# Patient Record
Sex: Female | Born: 1945
Health system: Southern US, Community
[De-identification: ages and names within clinical notes are randomized; demographics above are authoritative.]

## PROBLEM LIST (undated history)

## (undated) DIAGNOSIS — E785 Hyperlipidemia, unspecified: Secondary | ICD-10-CM

## (undated) DIAGNOSIS — E119 Type 2 diabetes mellitus without complications: Secondary | ICD-10-CM

## (undated) DIAGNOSIS — C801 Malignant (primary) neoplasm, unspecified: Secondary | ICD-10-CM

## (undated) DIAGNOSIS — N6320 Unspecified lump in the left breast, unspecified quadrant: Secondary | ICD-10-CM

## (undated) DIAGNOSIS — M1990 Unspecified osteoarthritis, unspecified site: Secondary | ICD-10-CM

## (undated) DIAGNOSIS — I10 Essential (primary) hypertension: Secondary | ICD-10-CM

## (undated) HISTORY — PX: APPENDECTOMY: SHX54

## (undated) HISTORY — PX: ABDOMINAL HYSTERECTOMY: SHX81

## (undated) HISTORY — PX: TONSILLECTOMY: SUR1361

---

## 2007-04-16 ENCOUNTER — Ambulatory Visit (HOSPITAL_COMMUNITY): Admission: RE | Admit: 2007-04-16 | Discharge: 2007-04-16 | Payer: Self-pay | Admitting: Obstetrics & Gynecology

## 2007-04-16 ENCOUNTER — Encounter (INDEPENDENT_AMBULATORY_CARE_PROVIDER_SITE_OTHER): Payer: Self-pay | Admitting: Specialist

## 2007-04-24 ENCOUNTER — Ambulatory Visit (HOSPITAL_COMMUNITY): Admission: RE | Admit: 2007-04-24 | Discharge: 2007-04-24 | Payer: Self-pay | Admitting: Obstetrics & Gynecology

## 2007-05-27 ENCOUNTER — Ambulatory Visit: Admission: RE | Admit: 2007-05-27 | Discharge: 2007-05-27 | Payer: Self-pay | Admitting: Gynecology

## 2008-03-05 ENCOUNTER — Other Ambulatory Visit: Admission: RE | Admit: 2008-03-05 | Discharge: 2008-03-05 | Payer: Self-pay | Admitting: Obstetrics and Gynecology

## 2008-04-28 ENCOUNTER — Ambulatory Visit (HOSPITAL_COMMUNITY): Admission: RE | Admit: 2008-04-28 | Discharge: 2008-04-28 | Payer: Self-pay | Admitting: Family Medicine

## 2008-07-29 ENCOUNTER — Other Ambulatory Visit: Admission: RE | Admit: 2008-07-29 | Discharge: 2008-07-29 | Payer: Self-pay | Admitting: Obstetrics and Gynecology

## 2011-05-01 NOTE — H&P (Signed)
Brittney Carroll, Brittney Carroll                 ACCOUNT NO.:  [id number]   MEDICAL RECORD NO.:  [id number]          PATIENT TYPE:  AMB   LOCATION:  DAY                           FACILITY:  [facility]   PHYSICIAN:  Florian Buff, M.D.   DATE OF BIRTH:  09/24/1946   DATE OF ADMISSION:  DATE OF DISCHARGE:  Brittney Carroll is a 65 year old white female, gravida 3, para 3, status post  tubal ligation who I saw in the office last week who has been having a  significant amount of postmenopausal bleeding now for 2 months.  The  patient has not had a period previously since 1996.  She has not been on  any hormone therapy at all.  Again, she has been bleeding almost daily  for 2 months.  Ultrasound performed ino the office by me revealed a  complex endometrium consistent with a polyp versus heterogenous myoma or  potentially endometrial hyperplasia or malignancy.  Her cervix was  normal.  Pap smear was done and is pending.  Because of the widened  endometrial stripe and the complexity of it, she needs to have a formal  D&C and not just an endometrial biopsy.  As a result, she is admitted  for outpatient hysteroscopy D&C.   PAST MEDICAL HISTORY:  Significant for hypertension.   PAST SURGICAL HISTORY:  1. Tonsils.  2. Tubal ligation.  3. Appendectomy.   PAST OBSTETRICAL HISTORY:  Three vaginal deliveries.   ALLERGIES:  ATENOLOL   MEDICATIONS:  Maxzide 37.5/25.   REVIEW OF SYSTEMS:  Otherwise negative.   PHYSICAL EXAMINATION:  VITAL SIGNS:  Blood pressure in the office  150/90.  Weight 204 pounds.  Hemoglobin 13.3.  HEENT:  Unremarkable.  NECK:  Thyroid normal.  LUNGS:  Clear.  HEART:  Regular rate and rhythm without murmur, regurgitation, or  gallop.  BREASTS:  Deferred.  ABDOMEN:  Benign.  PELVIC:  Exam reveals normal-size uterus on bimanual.  Normal external  genitalia.  Vagina pink and moist without discharge.  Cervix parous  without lesion.  Adnexa  negative.  EXTREMITIES:  Warm with no edema.  NEUROLOGIC:  Exam is grossly intact.   IMPRESSION:  1. Postmenopausal bleeding.  2. Heterogenous widened endometrial stripe.   PLAN:  The patient is admitted for hysteroscopy D&C for evaluation of  endometrium and cause of postmenopausal bleeding.  The patient  understands risks, benefits, indications, and alternatives and will  proceed.      Florian Buff, M.D.  Electronically Signed     LHE/MEDQ  D:  04/15/2007  T:  04/15/2007  Job:  [id number]

## 2011-05-01 NOTE — Consult Note (Signed)
Brittney Carroll, Brittney Carroll                 ACCOUNT NO.:  [id number]   MEDICAL RECORD NO.:  [id number]          PATIENT TYPE:  OUT   LOCATION:  GYN                          FACILITY:  [facility]   PHYSICIAN:  Marti Sleigh, M.D.DATE OF BIRTH:  20-Jan-1946   DATE OF CONSULTATION:  DATE OF DISCHARGE:                                 CONSULTATION   CHIEF COMPLAINT:  Endometrial cancer.   HISTORY OF PRESENT ILLNESS:  Patient has had approximately 3 months of  vaginal bleeding and cramping.  After initial evaluation by her  gynecologist, Dr. Tania Ade, she underwent a hysteroscopy and D&C on  April 16, 2007.  Final pathology showed an endometrioid adenocarcinoma,  grade 1.   Since the [facility], the patient has had minimal spotting.   The patient underwent menopause approximately 12 years ago, was not on  any hormone replacement therapy.   PAST MEDICAL HISTORY:  Medical illnesses hypertension and moderate  obesity.   PAST SURGICAL HISTORY:  Tonsils and adenoidectomy, tubal ligation,  appendectomy through a low midline incision.   OBSTETRICAL HISTORY:  Gravida 3.   FAMILY HISTORY:  The patient had a sister who died of breast cancer at  age 1.   DRUG ALLERGIES:  ATENOLOL causes her to have profound fatigue.   CURRENT MEDICATIONS:  Maxzide 37.5/25 daily.   SOCIAL HISTORY:  The patient is divorced.  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  She does not smoke.  She comes  accompanied by her son today.   PHYSICAL EXAM:  VITAL SIGNS:  Weight 203 pounds, height 5 feet 7 inches,  blood pressure 140/86, pulse 104.  GENERAL:  The patient is a healthy white female in no acute distress.  HEENT:  Negative.  NECK:  Supple without thyromegaly.  There is no supraclavicular or  inguinal adenopathy.  ABDOMEN:  Soft and nontender.  No masses, organomegaly, ascites or  hernias noted.  She does have a low midline incision which is well-  healed.  PELVIC:  EG/BUS.  Vagina and  bilateral urethra are normal.  The cervix  appears normal.  No lesions are noted.  No bleeding is noted.  Uterus is  difficult to outline because of the patient's obesity but I believe is  normal.  There are no adnexal masses noted.   DATA:  The patient comes accompanied by a CT scan which shows no  evidence of metastatic disease.  She does have what is described as a  soft tissue mass measuring 4.4 cm in the uterus, most likely a fibroid.  She has minimal sigmoid diverticulosis and no adenopathy.   IMPRESSION:  Grade-1 endometrial cancer.  The patient is strongly  desirous of proceeding with surgery as soon as possible.  I am able to  arrange for her to have surgery at [facility] next week on June 17.  The risks of surgery including  hemorrhage, infection, injury to adjacent viscera, thrombolic  complications and anesthetic risks were all outlined.  The decision  making regarding the possibility of doing a lymphadenectomy was also  discussed.  We will proceed with an abdominal hysterectomy and bilateral  salpingo-oophorectomy.  She will come to [facility] later this week for her  preoperative workup.      Marti Sleigh, M.D.  Electronically Signed     DC/MEDQ  D:  05/27/2007  T:  05/28/2007  Job:  [id number]   cc:   Florian Buff, M.D.  Fax: [phone]   Caswell Corwin, R.N.  [address], [place] [address]

## 2011-05-01 NOTE — Op Note (Signed)
[name], Brittney Carroll                 ACCOUNT NO.:  [id number]   MEDICAL RECORD NO.:  [id number]          PATIENT TYPE:  AMB   LOCATION:  DAY                           FACILITY:  [facility]   PHYSICIAN:  Florian Buff, M.D.   DATE OF BIRTH:  10-24-1946   DATE OF PROCEDURE:  04/16/2007  DATE OF DISCHARGE:                               OPERATIVE REPORT   PREOPERATIVE DIAGNOSES:  1. Postmenopausal bleeding.  2. Widened complex endometrial stripe ultrasound.   POSTOPERATIVE DIAGNOSES:  1. Postmenopausal bleeding.  2. Widened complex endometrial stripe ultrasound.  3. Large amount of endometrial curettings consistent with endometrial      hyperplasia, possibly endometrial carcinoma.   PROCEDURES:  1. Hysteroscopy.  2. Dilatation and curettage.   SURGEON:  Florian Buff, M.D.   ANESTHESIA:  General endotracheal   FINDINGS:  The patient had no specific polyp or fibroid.  It was all  endometrium.  She had a lush, large amount.  It did not frankly look  like carcinoma; a could be significant hyperplasia, or I guess it could  be low-grade carcinoma; but anyway, all of it was removed at the end of  the procedure; and is back to normal endometrium, as far as I was able  to discern.   DESCRIPTION OF OPERATION:  The patient was taken to the operating room  and placed in the supine position where she underwent general  endotracheal anesthesia.  She was placed in the dorsal lithotomy  position and prepped and draped in the usual sterile fashion.  A Graves  speculum was placed.  Her cervix was grasped.  Her cervix was dilated  serially to allow passage of the hysteroscope.  A diagnostic  hysteroscopy was performed.  A large amount of endometrial tissue was  found; no polyps, no fibroids.  The thickened, complex endometrium that  I saw at the time of ultrasound in the office was, indeed, the  endometrium.   A vigorous uterine curettage was performed.  A large amount of tissue  was returned;  some of it was sort of glistening and rubbery; none real  polypoid.  The endometrium appear to be clear at the end, which makes me  think  that it was probably hyperplasia; but I suppose that it could also be a  low-grade carcinoma.  There was no bleeding at the end of the procedure.  The patient was awakened from anesthesia, and taken to the recovery in  good stable condition.  All counts were correct x3.  She received Ancef  and Toradol.      Florian Buff, M.D.  Electronically Signed     LHE/MEDQ  D:  04/16/2007  T:  04/16/2007  Job:  [id number]

## 2015-08-31 ENCOUNTER — Other Ambulatory Visit (HOSPITAL_COMMUNITY): Payer: Self-pay | Admitting: Internal Medicine

## 2015-08-31 ENCOUNTER — Ambulatory Visit (HOSPITAL_COMMUNITY)
Admission: RE | Admit: 2015-08-31 | Discharge: 2015-08-31 | Disposition: A | Discharge status: Home / Self Care | Payer: PPO | Source: Ambulatory Visit | Attending: Internal Medicine | Admitting: Internal Medicine

## 2015-08-31 DIAGNOSIS — M5136 Other intervertebral disc degeneration, lumbar region: Secondary | ICD-10-CM | POA: Diagnosis not present

## 2015-08-31 DIAGNOSIS — M545 Low back pain: Secondary | ICD-10-CM | POA: Insufficient documentation

## 2015-08-31 DIAGNOSIS — R937 Abnormal findings on diagnostic imaging of other parts of musculoskeletal system: Secondary | ICD-10-CM | POA: Insufficient documentation

## 2015-08-31 DIAGNOSIS — M25552 Pain in left hip: Secondary | ICD-10-CM | POA: Diagnosis present

## 2015-08-31 DIAGNOSIS — R52 Pain, unspecified: Secondary | ICD-10-CM

## 2015-08-31 DIAGNOSIS — M5442 Lumbago with sciatica, left side: Secondary | ICD-10-CM

## 2015-09-15 ENCOUNTER — Ambulatory Visit (INDEPENDENT_AMBULATORY_CARE_PROVIDER_SITE_OTHER): Payer: PPO | Admitting: Orthopedic Surgery

## 2015-09-15 VITALS — BP 166/100 | Ht 66.5 in | Wt 204.0 lb

## 2015-09-15 DIAGNOSIS — M48061 Spinal stenosis, lumbar region without neurogenic claudication: Secondary | ICD-10-CM

## 2015-09-15 DIAGNOSIS — M1612 Unilateral primary osteoarthritis, left hip: Secondary | ICD-10-CM

## 2015-09-15 DIAGNOSIS — M1990 Unspecified osteoarthritis, unspecified site: Secondary | ICD-10-CM | POA: Diagnosis not present

## 2015-09-15 DIAGNOSIS — M4806 Spinal stenosis, lumbar region: Secondary | ICD-10-CM

## 2015-09-15 NOTE — Progress Notes (Signed)
Patient ID: Brittney Carroll, female   DOB: [birth year], 69 y.o.   MRN: [id number]  New patient   Chief Complaint  Patient presents with  . Hip Pain    Left hip pain radiating to Lumbar     Brittney Carroll is a 69 y.o. female.   HPI 69 yo female with long history of back and hip pain; presents with hip and back pain including groin and lateral leg pain. She cant walk in store without grocery cart. She says that her pain is severe and rated 7-10 out of 10 she had an IM shot of cortisone last week didn't help she's had no treatment other than aspirin. She does not want any other medication she actually said that she really didn't want anything that I offered but I told her that her condition has worsened to the point where she needs treatment and she needs to let me go ahead and make the recommendations as needed  She does have excessive nighttime urination she has loss of bladder control she has visual disturbance back pain and gait problem no other symptoms. Medical history hypertension cancer arthritis  Status post total hysterectomy for cancer she had an appendectomy  She takes aspirin and lisinopril  Medical allergies latex  Family history of tendon cancer mental illness anesthesia difficulties and alcoholism  Does not smoke or drink currently retired Review of Systems See hpi    Social History Social History  Substance Use Topics  . Smoking status: Not on file  . Smokeless tobacco: Not on file  . Alcohol Use: Not on file    Allergies  Allergen Reactions  . Atenolol Other (See Comments)    Makes BP drop suddenly and feels lifeless    Current Outpatient Prescriptions  Medication Sig Dispense Refill  . lisinopril (PRINIVIL,ZESTRIL) 10 MG tablet Take 10 mg by mouth daily.     No current facility-administered medications for this visit.       Physical Exam Blood pressure 166/100, height 5' 6.5" (1.689 m), weight 204 lb (92.534 kg). Physical Exam The patient is well  developed well nourished and well groomed. Orientation to person place and time is normal  Mood is pleasant. Ambulatory status is normal without a limp Skin remains intact without laceration ulceration or erythema Gross motor exam is intact without atrophy. Muscle tone normal grade 5 motor strength Neurovascular exam remains intact Inspection lumbar spine shows severe tenderness in the lower segments 4/5 and 5/1.   We also note decreased range of motion left hip normal right hip. Her flexion is fine her internal rotation is bad. Leg lengths are equal. Reflexes 2+ and equal at the knee 1+ at the ankle bilaterally with downgoing toes.  Upper extremities normal range of motion stability strength alignment  Data Reviewed  I interpreted the images as  L-spine films and hip films and pelvis show end-stage arthritis left hip and lumbar spondylosis  Assessment  She has definite symptoms of spinal stenosis with a grocery cart sign and she's had it for years she is loss of bladder control and will need an MRI to image the spine for imaging the neural elements to see if the back needs to be done decompression and fusion prior to the hip which is my preference if necessary.   Plan   MRI lumbar spine follow-up for further recommendations and treatment

## 2015-09-15 NOTE — Patient Instructions (Signed)
A MRI has been ordered for you. We will contact your insurance company for approval and notify you of date and time.

## 2015-10-14 ENCOUNTER — Other Ambulatory Visit (HOSPITAL_COMMUNITY): Payer: PPO

## 2015-10-18 ENCOUNTER — Ambulatory Visit (HOSPITAL_COMMUNITY)
Admission: RE | Admit: 2015-10-18 | Discharge: 2015-10-18 | Disposition: A | Discharge status: Home / Self Care | Payer: PPO | Source: Ambulatory Visit | Attending: Orthopedic Surgery | Admitting: Orthopedic Surgery

## 2015-10-18 DIAGNOSIS — M4806 Spinal stenosis, lumbar region: Secondary | ICD-10-CM | POA: Diagnosis not present

## 2015-10-18 DIAGNOSIS — M5125 Other intervertebral disc displacement, thoracolumbar region: Secondary | ICD-10-CM | POA: Diagnosis not present

## 2015-10-18 DIAGNOSIS — M545 Low back pain: Secondary | ICD-10-CM | POA: Diagnosis present

## 2015-10-18 DIAGNOSIS — M1288 Other specific arthropathies, not elsewhere classified, other specified site: Secondary | ICD-10-CM | POA: Insufficient documentation

## 2015-10-18 DIAGNOSIS — M48061 Spinal stenosis, lumbar region without neurogenic claudication: Secondary | ICD-10-CM

## 2015-10-24 ENCOUNTER — Telehealth: Payer: Self-pay | Admitting: Orthopedic Surgery

## 2015-10-25 NOTE — Telephone Encounter (Signed)
Nothing to add

## 2015-10-27 ENCOUNTER — Telehealth: Payer: Self-pay | Admitting: *Deleted

## 2015-10-27 NOTE — Telephone Encounter (Signed)
Patient called stating she had her MRI done 10/18/15 and she is requesting results. Please advise

## 2015-10-27 NOTE — Telephone Encounter (Signed)
Routing to Dr Aline Brochure for review

## 2015-11-03 ENCOUNTER — Telehealth: Payer: Self-pay | Admitting: *Deleted

## 2015-11-03 ENCOUNTER — Other Ambulatory Visit: Payer: Self-pay | Admitting: *Deleted

## 2015-11-03 DIAGNOSIS — M47816 Spondylosis without myelopathy or radiculopathy, lumbar region: Secondary | ICD-10-CM

## 2015-11-03 NOTE — Telephone Encounter (Signed)
This one is easy   1. See the neurosurgeon  2 when they fix the back I will fix the hip NOT until if they dont fix the back I will not fix the hip

## 2015-11-03 NOTE — Telephone Encounter (Signed)
Patient is agreeable to referrals and asks what does this mean as far as when she will need to be seen again for her hip?   Assessment  She has definite symptoms of spinal stenosis with a grocery cart sign and she's had it for years she is loss of bladder control and will need an MRI to image the spine for imaging the neural elements to see if the back needs to be done decompression and fusion prior to the hip which is my preference if necessary.  Notes Recorded by Carole Civil, MD on 10/27/2015 at 11:54 AM She has arthritis and bulging discs   i recommend physical therapy and referral to Dr Francesco Runner for L4-5 esi

## 2015-11-03 NOTE — Telephone Encounter (Signed)
Patient called left message on machine stating to cancel the injections with Dr. Lyla Son, Patient stated she wants to continue therapy. (see Previous phone note)

## 2015-11-03 NOTE — Telephone Encounter (Signed)
noted 

## 2015-11-04 ENCOUNTER — Other Ambulatory Visit: Payer: Self-pay | Admitting: *Deleted

## 2015-11-04 ENCOUNTER — Telehealth: Payer: Self-pay | Admitting: *Deleted

## 2015-11-04 DIAGNOSIS — M1990 Unspecified osteoarthritis, unspecified site: Secondary | ICD-10-CM

## 2015-11-04 NOTE — Telephone Encounter (Signed)
Patient agreeable to neurosurgery referral and wishes to go to Dr Carloyn Manner

## 2015-11-04 NOTE — Telephone Encounter (Signed)
Refer to n surgery

## 2015-11-04 NOTE — Telephone Encounter (Signed)
REFERRAL FAXED TO DR ROY 

## 2015-11-04 NOTE — Telephone Encounter (Signed)
You did not refer to neurosurgery, you referred to pain management Brittney Carroll)

## 2015-11-07 ENCOUNTER — Telehealth: Payer: Self-pay | Admitting: *Deleted

## 2015-11-07 NOTE — Telephone Encounter (Signed)
Patient feels she need to have PT on her hip before treating her back. Dr Althia Forts office was notified of the canceled appt.

## 2015-11-07 NOTE — Telephone Encounter (Signed)
Mariann Laster from [facility] called stating patient has canceled her appointment, Mariann Laster stated patient told her Dr. Aline Brochure referred her for the wrong thing, patient stated she does not need therapy for her back patient said she is worried it will mess her hips up.

## 2015-11-15 ENCOUNTER — Ambulatory Visit (HOSPITAL_COMMUNITY): Payer: PPO | Admitting: Physical Therapy

## 2016-01-02 ENCOUNTER — Telehealth: Payer: Self-pay | Admitting: *Deleted

## 2016-01-02 NOTE — Telephone Encounter (Signed)
APPT 03/30/16 1:30P W/ ROY

## 2016-05-21 DIAGNOSIS — E119 Type 2 diabetes mellitus without complications: Secondary | ICD-10-CM | POA: Diagnosis not present

## 2016-05-21 DIAGNOSIS — E782 Mixed hyperlipidemia: Secondary | ICD-10-CM | POA: Diagnosis not present

## 2016-05-24 DIAGNOSIS — E782 Mixed hyperlipidemia: Secondary | ICD-10-CM | POA: Diagnosis not present

## 2016-05-24 DIAGNOSIS — E119 Type 2 diabetes mellitus without complications: Secondary | ICD-10-CM | POA: Diagnosis not present

## 2016-05-24 DIAGNOSIS — I10 Essential (primary) hypertension: Secondary | ICD-10-CM | POA: Diagnosis not present

## 2016-06-13 DIAGNOSIS — M47816 Spondylosis without myelopathy or radiculopathy, lumbar region: Secondary | ICD-10-CM | POA: Diagnosis not present

## 2016-06-13 DIAGNOSIS — M25852 Other specified joint disorders, left hip: Secondary | ICD-10-CM | POA: Diagnosis not present

## 2017-11-18 DIAGNOSIS — I10 Essential (primary) hypertension: Secondary | ICD-10-CM | POA: Diagnosis not present

## 2017-11-18 DIAGNOSIS — E119 Type 2 diabetes mellitus without complications: Secondary | ICD-10-CM | POA: Diagnosis not present

## 2017-11-18 DIAGNOSIS — E6609 Other obesity due to excess calories: Secondary | ICD-10-CM | POA: Diagnosis not present

## 2017-11-18 DIAGNOSIS — E782 Mixed hyperlipidemia: Secondary | ICD-10-CM | POA: Diagnosis not present

## 2017-11-20 DIAGNOSIS — R002 Palpitations: Secondary | ICD-10-CM | POA: Diagnosis not present

## 2017-11-20 DIAGNOSIS — M1990 Unspecified osteoarthritis, unspecified site: Secondary | ICD-10-CM | POA: Diagnosis not present

## 2017-11-20 DIAGNOSIS — Z6832 Body mass index (BMI) 32.0-32.9, adult: Secondary | ICD-10-CM | POA: Diagnosis not present

## 2017-11-20 DIAGNOSIS — E6609 Other obesity due to excess calories: Secondary | ICD-10-CM | POA: Diagnosis not present

## 2017-11-20 DIAGNOSIS — E782 Mixed hyperlipidemia: Secondary | ICD-10-CM | POA: Diagnosis not present

## 2017-11-20 DIAGNOSIS — E119 Type 2 diabetes mellitus without complications: Secondary | ICD-10-CM | POA: Diagnosis not present

## 2017-11-20 DIAGNOSIS — I10 Essential (primary) hypertension: Secondary | ICD-10-CM | POA: Diagnosis not present

## 2017-11-20 DIAGNOSIS — R350 Frequency of micturition: Secondary | ICD-10-CM | POA: Diagnosis not present

## 2017-11-20 DIAGNOSIS — D72829 Elevated white blood cell count, unspecified: Secondary | ICD-10-CM | POA: Diagnosis not present

## 2018-06-05 DIAGNOSIS — E119 Type 2 diabetes mellitus without complications: Secondary | ICD-10-CM | POA: Diagnosis not present

## 2018-06-05 DIAGNOSIS — E782 Mixed hyperlipidemia: Secondary | ICD-10-CM | POA: Diagnosis not present

## 2018-06-11 DIAGNOSIS — D72829 Elevated white blood cell count, unspecified: Secondary | ICD-10-CM | POA: Diagnosis not present

## 2018-06-11 DIAGNOSIS — M1990 Unspecified osteoarthritis, unspecified site: Secondary | ICD-10-CM | POA: Diagnosis not present

## 2018-06-11 DIAGNOSIS — E6609 Other obesity due to excess calories: Secondary | ICD-10-CM | POA: Diagnosis not present

## 2018-06-11 DIAGNOSIS — R002 Palpitations: Secondary | ICD-10-CM | POA: Diagnosis not present

## 2018-06-11 DIAGNOSIS — I10 Essential (primary) hypertension: Secondary | ICD-10-CM | POA: Diagnosis not present

## 2018-06-11 DIAGNOSIS — E1169 Type 2 diabetes mellitus with other specified complication: Secondary | ICD-10-CM | POA: Diagnosis not present

## 2018-06-11 DIAGNOSIS — E782 Mixed hyperlipidemia: Secondary | ICD-10-CM | POA: Diagnosis not present

## 2018-06-11 DIAGNOSIS — R350 Frequency of micturition: Secondary | ICD-10-CM | POA: Diagnosis not present

## 2019-06-09 DIAGNOSIS — E1169 Type 2 diabetes mellitus with other specified complication: Secondary | ICD-10-CM | POA: Diagnosis not present

## 2019-06-09 DIAGNOSIS — D72829 Elevated white blood cell count, unspecified: Secondary | ICD-10-CM | POA: Diagnosis not present

## 2019-06-09 DIAGNOSIS — R002 Palpitations: Secondary | ICD-10-CM | POA: Diagnosis not present

## 2019-06-09 DIAGNOSIS — M160 Bilateral primary osteoarthritis of hip: Secondary | ICD-10-CM | POA: Diagnosis not present

## 2019-06-09 DIAGNOSIS — E119 Type 2 diabetes mellitus without complications: Secondary | ICD-10-CM | POA: Diagnosis not present

## 2019-06-09 DIAGNOSIS — R350 Frequency of micturition: Secondary | ICD-10-CM | POA: Diagnosis not present

## 2019-06-09 DIAGNOSIS — I10 Essential (primary) hypertension: Secondary | ICD-10-CM | POA: Diagnosis not present

## 2019-06-09 DIAGNOSIS — E1165 Type 2 diabetes mellitus with hyperglycemia: Secondary | ICD-10-CM | POA: Diagnosis not present

## 2019-06-09 DIAGNOSIS — R944 Abnormal results of kidney function studies: Secondary | ICD-10-CM | POA: Diagnosis not present

## 2019-06-09 DIAGNOSIS — Z6832 Body mass index (BMI) 32.0-32.9, adult: Secondary | ICD-10-CM | POA: Diagnosis not present

## 2019-06-09 DIAGNOSIS — E782 Mixed hyperlipidemia: Secondary | ICD-10-CM | POA: Diagnosis not present

## 2019-06-09 DIAGNOSIS — Z7409 Other reduced mobility: Secondary | ICD-10-CM | POA: Diagnosis not present

## 2019-06-09 DIAGNOSIS — E6609 Other obesity due to excess calories: Secondary | ICD-10-CM | POA: Diagnosis not present

## 2019-06-09 DIAGNOSIS — M1990 Unspecified osteoarthritis, unspecified site: Secondary | ICD-10-CM | POA: Diagnosis not present

## 2019-06-09 DIAGNOSIS — R2689 Other abnormalities of gait and mobility: Secondary | ICD-10-CM | POA: Diagnosis not present

## 2019-07-03 DIAGNOSIS — Z0000 Encounter for general adult medical examination without abnormal findings: Secondary | ICD-10-CM | POA: Diagnosis not present

## 2019-07-20 ENCOUNTER — Other Ambulatory Visit: Payer: Self-pay

## 2019-08-21 DIAGNOSIS — M1990 Unspecified osteoarthritis, unspecified site: Secondary | ICD-10-CM | POA: Diagnosis not present

## 2019-08-21 DIAGNOSIS — R269 Unspecified abnormalities of gait and mobility: Secondary | ICD-10-CM | POA: Diagnosis not present

## 2019-08-21 DIAGNOSIS — N63 Unspecified lump in unspecified breast: Secondary | ICD-10-CM | POA: Diagnosis not present

## 2019-09-15 DIAGNOSIS — M1990 Unspecified osteoarthritis, unspecified site: Secondary | ICD-10-CM | POA: Diagnosis not present

## 2019-09-15 DIAGNOSIS — Z7409 Other reduced mobility: Secondary | ICD-10-CM | POA: Diagnosis not present

## 2019-12-16 DIAGNOSIS — Z6832 Body mass index (BMI) 32.0-32.9, adult: Secondary | ICD-10-CM | POA: Diagnosis not present

## 2019-12-16 DIAGNOSIS — R2689 Other abnormalities of gait and mobility: Secondary | ICD-10-CM | POA: Diagnosis not present

## 2019-12-16 DIAGNOSIS — R350 Frequency of micturition: Secondary | ICD-10-CM | POA: Diagnosis not present

## 2019-12-16 DIAGNOSIS — E782 Mixed hyperlipidemia: Secondary | ICD-10-CM | POA: Diagnosis not present

## 2019-12-16 DIAGNOSIS — R002 Palpitations: Secondary | ICD-10-CM | POA: Diagnosis not present

## 2019-12-16 DIAGNOSIS — D72829 Elevated white blood cell count, unspecified: Secondary | ICD-10-CM | POA: Diagnosis not present

## 2019-12-16 DIAGNOSIS — I10 Essential (primary) hypertension: Secondary | ICD-10-CM | POA: Diagnosis not present

## 2019-12-16 DIAGNOSIS — R269 Unspecified abnormalities of gait and mobility: Secondary | ICD-10-CM | POA: Diagnosis not present

## 2019-12-16 DIAGNOSIS — E6609 Other obesity due to excess calories: Secondary | ICD-10-CM | POA: Diagnosis not present

## 2019-12-16 DIAGNOSIS — M160 Bilateral primary osteoarthritis of hip: Secondary | ICD-10-CM | POA: Diagnosis not present

## 2019-12-16 DIAGNOSIS — M1990 Unspecified osteoarthritis, unspecified site: Secondary | ICD-10-CM | POA: Diagnosis not present

## 2019-12-16 DIAGNOSIS — N63 Unspecified lump in unspecified breast: Secondary | ICD-10-CM | POA: Diagnosis not present

## 2019-12-16 DIAGNOSIS — E119 Type 2 diabetes mellitus without complications: Secondary | ICD-10-CM | POA: Diagnosis not present

## 2019-12-24 DIAGNOSIS — Z7982 Long term (current) use of aspirin: Secondary | ICD-10-CM | POA: Diagnosis not present

## 2019-12-24 DIAGNOSIS — Z9181 History of falling: Secondary | ICD-10-CM | POA: Diagnosis not present

## 2019-12-24 DIAGNOSIS — I10 Essential (primary) hypertension: Secondary | ICD-10-CM | POA: Diagnosis not present

## 2019-12-24 DIAGNOSIS — G8929 Other chronic pain: Secondary | ICD-10-CM | POA: Diagnosis not present

## 2019-12-24 DIAGNOSIS — Z7984 Long term (current) use of oral hypoglycemic drugs: Secondary | ICD-10-CM | POA: Diagnosis not present

## 2019-12-24 DIAGNOSIS — E119 Type 2 diabetes mellitus without complications: Secondary | ICD-10-CM | POA: Diagnosis not present

## 2019-12-24 DIAGNOSIS — M160 Bilateral primary osteoarthritis of hip: Secondary | ICD-10-CM | POA: Diagnosis not present

## 2020-01-18 DIAGNOSIS — I10 Essential (primary) hypertension: Secondary | ICD-10-CM | POA: Diagnosis not present

## 2020-01-18 DIAGNOSIS — E119 Type 2 diabetes mellitus without complications: Secondary | ICD-10-CM | POA: Diagnosis not present

## 2020-01-18 DIAGNOSIS — Z9181 History of falling: Secondary | ICD-10-CM | POA: Diagnosis not present

## 2020-01-18 DIAGNOSIS — M160 Bilateral primary osteoarthritis of hip: Secondary | ICD-10-CM | POA: Diagnosis not present

## 2020-01-18 DIAGNOSIS — G8929 Other chronic pain: Secondary | ICD-10-CM | POA: Diagnosis not present

## 2020-01-18 DIAGNOSIS — Z7982 Long term (current) use of aspirin: Secondary | ICD-10-CM | POA: Diagnosis not present

## 2020-01-18 DIAGNOSIS — Z7984 Long term (current) use of oral hypoglycemic drugs: Secondary | ICD-10-CM | POA: Diagnosis not present

## 2020-02-23 DIAGNOSIS — E119 Type 2 diabetes mellitus without complications: Secondary | ICD-10-CM | POA: Diagnosis not present

## 2020-02-23 DIAGNOSIS — I10 Essential (primary) hypertension: Secondary | ICD-10-CM | POA: Diagnosis not present

## 2020-02-23 DIAGNOSIS — E782 Mixed hyperlipidemia: Secondary | ICD-10-CM | POA: Diagnosis not present

## 2020-04-14 DIAGNOSIS — I10 Essential (primary) hypertension: Secondary | ICD-10-CM | POA: Diagnosis not present

## 2020-04-14 DIAGNOSIS — E119 Type 2 diabetes mellitus without complications: Secondary | ICD-10-CM | POA: Diagnosis not present

## 2020-04-14 DIAGNOSIS — E782 Mixed hyperlipidemia: Secondary | ICD-10-CM | POA: Diagnosis not present

## 2020-06-29 DIAGNOSIS — I10 Essential (primary) hypertension: Secondary | ICD-10-CM | POA: Diagnosis not present

## 2020-06-29 DIAGNOSIS — E119 Type 2 diabetes mellitus without complications: Secondary | ICD-10-CM | POA: Diagnosis not present

## 2020-06-29 DIAGNOSIS — E782 Mixed hyperlipidemia: Secondary | ICD-10-CM | POA: Diagnosis not present

## 2020-07-18 DIAGNOSIS — I10 Essential (primary) hypertension: Secondary | ICD-10-CM | POA: Diagnosis not present

## 2020-07-18 DIAGNOSIS — E782 Mixed hyperlipidemia: Secondary | ICD-10-CM | POA: Diagnosis not present

## 2020-07-18 DIAGNOSIS — E119 Type 2 diabetes mellitus without complications: Secondary | ICD-10-CM | POA: Diagnosis not present

## 2020-08-25 DIAGNOSIS — I10 Essential (primary) hypertension: Secondary | ICD-10-CM | POA: Diagnosis not present

## 2020-08-25 DIAGNOSIS — E782 Mixed hyperlipidemia: Secondary | ICD-10-CM | POA: Diagnosis not present

## 2020-08-25 DIAGNOSIS — E119 Type 2 diabetes mellitus without complications: Secondary | ICD-10-CM | POA: Diagnosis not present

## 2020-12-08 DIAGNOSIS — N63 Unspecified lump in unspecified breast: Secondary | ICD-10-CM | POA: Diagnosis not present

## 2020-12-08 DIAGNOSIS — E119 Type 2 diabetes mellitus without complications: Secondary | ICD-10-CM | POA: Diagnosis not present

## 2020-12-08 DIAGNOSIS — Z6832 Body mass index (BMI) 32.0-32.9, adult: Secondary | ICD-10-CM | POA: Diagnosis not present

## 2020-12-08 DIAGNOSIS — E6609 Other obesity due to excess calories: Secondary | ICD-10-CM | POA: Diagnosis not present

## 2020-12-08 DIAGNOSIS — R002 Palpitations: Secondary | ICD-10-CM | POA: Diagnosis not present

## 2020-12-08 DIAGNOSIS — I10 Essential (primary) hypertension: Secondary | ICD-10-CM | POA: Diagnosis not present

## 2020-12-08 DIAGNOSIS — E782 Mixed hyperlipidemia: Secondary | ICD-10-CM | POA: Diagnosis not present

## 2020-12-08 DIAGNOSIS — M1990 Unspecified osteoarthritis, unspecified site: Secondary | ICD-10-CM | POA: Diagnosis not present

## 2020-12-08 DIAGNOSIS — R2689 Other abnormalities of gait and mobility: Secondary | ICD-10-CM | POA: Diagnosis not present

## 2020-12-08 DIAGNOSIS — R350 Frequency of micturition: Secondary | ICD-10-CM | POA: Diagnosis not present

## 2020-12-08 DIAGNOSIS — D72829 Elevated white blood cell count, unspecified: Secondary | ICD-10-CM | POA: Diagnosis not present

## 2020-12-08 DIAGNOSIS — M160 Bilateral primary osteoarthritis of hip: Secondary | ICD-10-CM | POA: Diagnosis not present

## 2020-12-09 ENCOUNTER — Encounter (HOSPITAL_COMMUNITY): Payer: Self-pay | Admitting: Emergency Medicine

## 2020-12-09 ENCOUNTER — Emergency Department (HOSPITAL_COMMUNITY): Payer: PPO

## 2020-12-09 ENCOUNTER — Other Ambulatory Visit: Payer: Self-pay

## 2020-12-09 ENCOUNTER — Inpatient Hospital Stay (HOSPITAL_COMMUNITY)
Admission: EM | Admit: 2020-12-09 | Discharge: 2020-12-14 | DRG: 378 | Disposition: A | Discharge status: Skilled Nursing Facility | Payer: PPO | Attending: Internal Medicine | Admitting: Internal Medicine

## 2020-12-09 DIAGNOSIS — D62 Acute posthemorrhagic anemia: Secondary | ICD-10-CM | POA: Diagnosis present

## 2020-12-09 DIAGNOSIS — C50412 Malignant neoplasm of upper-outer quadrant of left female breast: Secondary | ICD-10-CM | POA: Diagnosis not present

## 2020-12-09 DIAGNOSIS — Z90710 Acquired absence of both cervix and uterus: Secondary | ICD-10-CM

## 2020-12-09 DIAGNOSIS — K2950 Unspecified chronic gastritis without bleeding: Secondary | ICD-10-CM | POA: Diagnosis present

## 2020-12-09 DIAGNOSIS — K922 Gastrointestinal hemorrhage, unspecified: Secondary | ICD-10-CM | POA: Diagnosis not present

## 2020-12-09 DIAGNOSIS — Z66 Do not resuscitate: Secondary | ICD-10-CM | POA: Diagnosis not present

## 2020-12-09 DIAGNOSIS — Z20822 Contact with and (suspected) exposure to covid-19: Secondary | ICD-10-CM | POA: Diagnosis not present

## 2020-12-09 DIAGNOSIS — D500 Iron deficiency anemia secondary to blood loss (chronic): Secondary | ICD-10-CM | POA: Diagnosis not present

## 2020-12-09 DIAGNOSIS — K269 Duodenal ulcer, unspecified as acute or chronic, without hemorrhage or perforation: Secondary | ICD-10-CM | POA: Diagnosis not present

## 2020-12-09 DIAGNOSIS — M25512 Pain in left shoulder: Secondary | ICD-10-CM | POA: Diagnosis not present

## 2020-12-09 DIAGNOSIS — G8929 Other chronic pain: Secondary | ICD-10-CM | POA: Diagnosis not present

## 2020-12-09 DIAGNOSIS — D509 Iron deficiency anemia, unspecified: Secondary | ICD-10-CM | POA: Diagnosis present

## 2020-12-09 DIAGNOSIS — Z87891 Personal history of nicotine dependence: Secondary | ICD-10-CM | POA: Diagnosis not present

## 2020-12-09 DIAGNOSIS — K2980 Duodenitis without bleeding: Secondary | ICD-10-CM | POA: Diagnosis not present

## 2020-12-09 DIAGNOSIS — C50912 Malignant neoplasm of unspecified site of left female breast: Secondary | ICD-10-CM | POA: Diagnosis not present

## 2020-12-09 DIAGNOSIS — K5731 Diverticulosis of large intestine without perforation or abscess with bleeding: Principal | ICD-10-CM | POA: Diagnosis present

## 2020-12-09 DIAGNOSIS — K2970 Gastritis, unspecified, without bleeding: Secondary | ICD-10-CM | POA: Diagnosis not present

## 2020-12-09 DIAGNOSIS — N63 Unspecified lump in unspecified breast: Secondary | ICD-10-CM

## 2020-12-09 DIAGNOSIS — Z888 Allergy status to other drugs, medicaments and biological substances status: Secondary | ICD-10-CM | POA: Diagnosis not present

## 2020-12-09 DIAGNOSIS — Z803 Family history of malignant neoplasm of breast: Secondary | ICD-10-CM | POA: Diagnosis not present

## 2020-12-09 DIAGNOSIS — D649 Anemia, unspecified: Secondary | ICD-10-CM

## 2020-12-09 DIAGNOSIS — M6281 Muscle weakness (generalized): Secondary | ICD-10-CM | POA: Diagnosis not present

## 2020-12-09 DIAGNOSIS — Z23 Encounter for immunization: Secondary | ICD-10-CM

## 2020-12-09 DIAGNOSIS — K5730 Diverticulosis of large intestine without perforation or abscess without bleeding: Secondary | ICD-10-CM | POA: Diagnosis not present

## 2020-12-09 DIAGNOSIS — E785 Hyperlipidemia, unspecified: Secondary | ICD-10-CM | POA: Diagnosis not present

## 2020-12-09 DIAGNOSIS — R109 Unspecified abdominal pain: Secondary | ICD-10-CM | POA: Diagnosis not present

## 2020-12-09 DIAGNOSIS — K5791 Diverticulosis of intestine, part unspecified, without perforation or abscess with bleeding: Secondary | ICD-10-CM

## 2020-12-09 DIAGNOSIS — Z8589 Personal history of malignant neoplasm of other organs and systems: Secondary | ICD-10-CM | POA: Diagnosis not present

## 2020-12-09 DIAGNOSIS — Z7982 Long term (current) use of aspirin: Secondary | ICD-10-CM

## 2020-12-09 DIAGNOSIS — K921 Melena: Secondary | ICD-10-CM | POA: Diagnosis not present

## 2020-12-09 DIAGNOSIS — Z853 Personal history of malignant neoplasm of breast: Secondary | ICD-10-CM

## 2020-12-09 DIAGNOSIS — Z79899 Other long term (current) drug therapy: Secondary | ICD-10-CM | POA: Diagnosis not present

## 2020-12-09 DIAGNOSIS — Z7189 Other specified counseling: Secondary | ICD-10-CM

## 2020-12-09 DIAGNOSIS — K264 Chronic or unspecified duodenal ulcer with hemorrhage: Secondary | ICD-10-CM | POA: Diagnosis not present

## 2020-12-09 DIAGNOSIS — M160 Bilateral primary osteoarthritis of hip: Secondary | ICD-10-CM | POA: Diagnosis present

## 2020-12-09 DIAGNOSIS — K2901 Acute gastritis with bleeding: Secondary | ICD-10-CM | POA: Diagnosis not present

## 2020-12-09 DIAGNOSIS — K5900 Constipation, unspecified: Secondary | ICD-10-CM | POA: Diagnosis not present

## 2020-12-09 DIAGNOSIS — Z515 Encounter for palliative care: Secondary | ICD-10-CM | POA: Diagnosis not present

## 2020-12-09 DIAGNOSIS — K648 Other hemorrhoids: Secondary | ICD-10-CM | POA: Diagnosis not present

## 2020-12-09 DIAGNOSIS — I10 Essential (primary) hypertension: Secondary | ICD-10-CM | POA: Diagnosis present

## 2020-12-09 DIAGNOSIS — E1165 Type 2 diabetes mellitus with hyperglycemia: Secondary | ICD-10-CM | POA: Diagnosis not present

## 2020-12-09 DIAGNOSIS — N6320 Unspecified lump in the left breast, unspecified quadrant: Secondary | ICD-10-CM | POA: Diagnosis not present

## 2020-12-09 DIAGNOSIS — Z8542 Personal history of malignant neoplasm of other parts of uterus: Secondary | ICD-10-CM

## 2020-12-09 DIAGNOSIS — U071 COVID-19: Secondary | ICD-10-CM | POA: Diagnosis not present

## 2020-12-09 DIAGNOSIS — R262 Difficulty in walking, not elsewhere classified: Secondary | ICD-10-CM | POA: Diagnosis not present

## 2020-12-09 DIAGNOSIS — K3189 Other diseases of stomach and duodenum: Secondary | ICD-10-CM | POA: Diagnosis not present

## 2020-12-09 DIAGNOSIS — M25559 Pain in unspecified hip: Secondary | ICD-10-CM | POA: Diagnosis not present

## 2020-12-09 HISTORY — DX: Type 2 diabetes mellitus without complications: E11.9

## 2020-12-09 HISTORY — DX: Essential (primary) hypertension: I10

## 2020-12-09 HISTORY — DX: Malignant (primary) neoplasm, unspecified: C80.1

## 2020-12-09 HISTORY — DX: Hyperlipidemia, unspecified: E78.5

## 2020-12-09 HISTORY — DX: Unspecified osteoarthritis, unspecified site: M19.90

## 2020-12-09 HISTORY — DX: Unspecified lump in the left breast, unspecified quadrant: N63.20

## 2020-12-09 LAB — COMPREHENSIVE METABOLIC PANEL
ALT: 12 U/L (ref 0–44)
AST: 14 U/L — ABNORMAL LOW (ref 15–41)
Albumin: 3.5 g/dL (ref 3.5–5.0)
Alkaline Phosphatase: 83 U/L (ref 38–126)
Anion gap: 8 (ref 5–15)
BUN: 19 mg/dL (ref 8–23)
CO2: 24 mmol/L (ref 22–32)
Calcium: 9.3 mg/dL (ref 8.9–10.3)
Chloride: 105 mmol/L (ref 98–111)
Creatinine, Ser: 0.79 mg/dL (ref 0.44–1.00)
GFR, Estimated: >60 mL/min (ref >60)
Glucose, Bld: 228 mg/dL — ABNORMAL HIGH (ref 70–99)
Potassium: 3.7 mmol/L (ref 3.5–5.1)
Sodium: 137 mmol/L (ref 135–145)
Total Bilirubin: 0.3 mg/dL (ref 0.3–1.2)
Total Protein: 6.8 g/dL (ref 6.5–8.1)

## 2020-12-09 LAB — CBC WITH DIFFERENTIAL/PLATELET
Abs Immature Granulocytes: 0.04 10*3/uL (ref 0.00–0.07)
Basophils Absolute: 0.1 10*3/uL (ref 0.0–0.1)
Basophils Relative: 1 %
Eosinophils Absolute: 0.3 10*3/uL (ref 0.0–0.5)
Eosinophils Relative: 2 %
HCT: 20.9 % — ABNORMAL LOW (ref 36.0–46.0)
Hemoglobin: 6.2 g/dL — CL (ref 12.0–15.0)
Immature Granulocytes: 0 %
Lymphocytes Relative: 26 %
Lymphs Abs: 2.7 10*3/uL (ref 0.7–4.0)
MCH: 22.8 pg — ABNORMAL LOW (ref 26.0–34.0)
MCHC: 29.7 g/dL — ABNORMAL LOW (ref 30.0–36.0)
MCV: 76.8 fL — ABNORMAL LOW (ref 80.0–100.0)
Monocytes Absolute: 1 10*3/uL (ref 0.1–1.0)
Monocytes Relative: 10 %
Neutro Abs: 6.4 10*3/uL (ref 1.7–7.7)
Neutrophils Relative %: 61 %
Platelets: 419 10*3/uL — ABNORMAL HIGH (ref 150–400)
RBC: 2.72 MIL/uL — ABNORMAL LOW (ref 3.87–5.11)
RDW: 15.1 % (ref 11.5–15.5)
WBC: 10.4 10*3/uL (ref 4.0–10.5)
nRBC: 0 % (ref 0.0–0.2)

## 2020-12-09 LAB — RESP PANEL BY RT-PCR (FLU A&B, COVID) ARPGX2
Influenza A by PCR: NEGATIVE
Influenza B by PCR: NEGATIVE
SARS Coronavirus 2 by RT PCR: NEGATIVE

## 2020-12-09 LAB — ABO/RH: ABO/RH(D): B POS

## 2020-12-09 LAB — URINALYSIS, ROUTINE W REFLEX MICROSCOPIC
Bilirubin Urine: NEGATIVE
Glucose, UA: NEGATIVE mg/dL
Hgb urine dipstick: NEGATIVE
Ketones, ur: NEGATIVE mg/dL
Leukocytes,Ua: NEGATIVE
Nitrite: NEGATIVE
Protein, ur: NEGATIVE mg/dL
Specific Gravity, Urine: 1.004 — ABNORMAL LOW (ref 1.005–1.030)
pH: 5 (ref 5.0–8.0)

## 2020-12-09 LAB — IRON AND TIBC
Iron: 10 ug/dL — ABNORMAL LOW (ref 28–170)
Saturation Ratios: 2 % — ABNORMAL LOW (ref 10.4–31.8)
TIBC: 475 ug/dL — ABNORMAL HIGH (ref 250–450)
UIBC: 465 ug/dL

## 2020-12-09 LAB — PREPARE RBC (CROSSMATCH)

## 2020-12-09 LAB — RETICULOCYTES
Immature Retic Fract: 39.5 % — ABNORMAL HIGH (ref 2.3–15.9)
RBC.: 2.8 MIL/uL — ABNORMAL LOW (ref 3.87–5.11)
Retic Count, Absolute: 75 10*3/uL (ref 19.0–186.0)
Retic Ct Pct: 2.7 % (ref 0.4–3.1)

## 2020-12-09 LAB — HEMOGLOBIN A1C
Hgb A1c MFr Bld: 8.6 % — ABNORMAL HIGH (ref 4.8–5.6)
Mean Plasma Glucose: 200.12 mg/dL

## 2020-12-09 LAB — POC OCCULT BLOOD, ED: Fecal Occult Bld: POSITIVE — AB

## 2020-12-09 LAB — PROTIME-INR
INR: 1 (ref 0.8–1.2)
Prothrombin Time: 13.2 seconds (ref 11.4–15.2)

## 2020-12-09 LAB — LACTIC ACID, PLASMA: Lactic Acid, Venous: 2.2 mmol/L (ref 0.5–1.9)

## 2020-12-09 MED ORDER — MORPHINE SULFATE (PF) 4 MG/ML IV SOLN
4.0000 mg | Freq: Once | INTRAVENOUS | Status: AC
  Administered 2020-12-09: 4 mg via INTRAVENOUS
  Filled 2020-12-09: qty 1

## 2020-12-09 MED ORDER — PANTOPRAZOLE SODIUM 40 MG IV SOLR
40.0000 mg | Freq: Two times a day (BID) | INTRAVENOUS | Status: DC
  Administered 2020-12-10 – 2020-12-13 (×7): 40 mg via INTRAVENOUS
  Filled 2020-12-09 (×7): qty 40

## 2020-12-09 MED ORDER — ONDANSETRON HCL 4 MG/2ML IJ SOLN
4.0000 mg | Freq: Once | INTRAMUSCULAR | Status: AC
  Administered 2020-12-09: 4 mg via INTRAVENOUS
  Filled 2020-12-09: qty 2

## 2020-12-09 MED ORDER — ACETAMINOPHEN 650 MG RE SUPP: 650.0000 mg | Freq: Four times a day (QID) | RECTAL | Status: DC | PRN

## 2020-12-09 MED ORDER — OXYCODONE HCL 5 MG PO TABS
5.0000 mg | ORAL_TABLET | Freq: Four times a day (QID) | ORAL | Status: DC | PRN
  Administered 2020-12-10: 5 mg via ORAL
  Filled 2020-12-09: qty 1

## 2020-12-09 MED ORDER — IOHEXOL 300 MG/ML  SOLN
100.0000 mL | Freq: Once | INTRAMUSCULAR | Status: AC | PRN
  Administered 2020-12-09: 100 mL via INTRAVENOUS

## 2020-12-09 MED ORDER — ACETAMINOPHEN 325 MG PO TABS: 650.0000 mg | ORAL_TABLET | Freq: Four times a day (QID) | ORAL | Status: DC | PRN

## 2020-12-09 MED ORDER — HYDRALAZINE HCL 25 MG PO TABS: 25.0000 mg | ORAL_TABLET | Freq: Four times a day (QID) | ORAL | Status: DC | PRN

## 2020-12-09 MED ORDER — SODIUM CHLORIDE 0.9 % IV SOLN
10.0000 mL/h | Freq: Once | INTRAVENOUS | Status: AC
  Administered 2020-12-09: 10 mL/h via INTRAVENOUS

## 2020-12-09 NOTE — ED Provider Notes (Signed)
[facility] EMERGENCY DEPARTMENT Provider Note   CSN: [id number] Arrival date & time: 12/09/20  1057     History Chief Complaint  Patient presents with  . Abnormal Lab    Brittney Carroll is a 74 y.o. female with a chronic GI bleed intermittently over the past few months who presents with concern for hemoglobin less than 7 based on CBC obtained by PCP. Was told to come to ED for transfusion.  States chronic GI bleed x a few months, takes aspirin and ibuprofen both daily for her arthritis pain.  States Tylenol has not helped her.  States that yesterday she was gushing bright red blood per rectum, today only having bright red blood on toilet tissue when she wipes.  Additionally having dark black sticky tarry stools.  Endorses nausea, shakiness, headache.  Denies chest pain, palpitations, shortness of breath. Denies vomiting.  Patient states she has problems getting to and from the doctor's office due to transportation issues.  States "children are too busy for this kind of thing".  I personally reviewed this patient's medical records.  She has history of gynecologic cancer in 2008, subsequently underwent total abdominal hysterectomy.  History of hypertension, arthritis, diabetes mellitus without complication, hyperlipidemia.  HPI     Past Medical History:  Diagnosis Date  . Arthritis   . Breast mass, left   . Cancer ([place])    vaginal 2008  . Diabetes mellitus without complication ([place])   . Hyperlipidemia   . Hypertension     Patient Active Problem List   Diagnosis Date Noted  . GI bleed 12/09/2020    Past Surgical History:  Procedure Laterality Date  . ABDOMINAL HYSTERECTOMY    . APPENDECTOMY    . TONSILLECTOMY       OB History   No obstetric history on file.     History reviewed. No pertinent family history.  Social History   Tobacco Use  . Smoking status: Former Smoker    Packs/day: 1.50    Years: 7.00    Pack years: 10.50    Types: Cigarettes  .  Smokeless tobacco: Never Used  . Tobacco comment: quit 50 yrs ago  Vaping Use  . Vaping Use: Never used  Substance Use Topics  . Alcohol use: Not Currently  . Drug use: Never    Home Medications Prior to Admission medications   Medication Sig Start Date End Date Taking? Authorizing Provider  lisinopril (PRINIVIL,ZESTRIL) 10 MG tablet Take 10 mg by mouth daily.   Yes [provider]    Allergies    Atenolol  Review of Systems   Review of Systems  Constitutional: Positive for activity change, appetite change, chills and fatigue. Negative for fever.  HENT: Negative.   Respiratory: Positive for shortness of breath. Negative for chest tightness and wheezing.   Cardiovascular: Negative for chest pain, palpitations and leg swelling.  Gastrointestinal: Positive for abdominal pain, anal bleeding, blood in stool, diarrhea and nausea. Negative for constipation and vomiting.  Genitourinary: Negative for dysuria, flank pain, frequency, pelvic pain, urgency and vaginal bleeding.  Musculoskeletal: Positive for back pain.  Skin: Negative.   Neurological: Positive for headaches. Negative for dizziness, syncope, facial asymmetry, weakness and light-headedness.  Hematological: Bruises/bleeds easily.    Physical Exam Updated Vital Signs BP (!) 169/63 (BP Location: Right Arm)   Pulse 81   Temp 97.9 F (36.6 C) (Oral)   Resp 20   Ht 5\' 6"  (1.676 m)   Wt 89.7 kg   SpO2  95%   BMI 31.92 kg/m   Physical Exam Vitals and nursing note reviewed. Exam conducted with a chaperone present.  Constitutional:      Appearance: She is obese.  HENT:     Head: Normocephalic and atraumatic.     Nose: Nose normal.     Mouth/Throat:     Mouth: Mucous membranes are moist.     Pharynx: Oropharynx is clear. Uvula midline. No oropharyngeal exudate or posterior oropharyngeal erythema.     Tonsils: No tonsillar exudate.  Eyes:     General:        Right eye: No discharge.        Left eye: No  discharge.     Extraocular Movements: Extraocular movements intact.     Conjunctiva/sclera: Conjunctivae normal.     Pupils: Pupils are equal, round, and reactive to light.  Neck:     Trachea: Trachea and phonation normal.  Cardiovascular:     Rate and Rhythm: Regular rhythm. Tachycardia present.     Pulses:          Radial pulses are 2+ on the right side and 2+ on the left side.       Dorsalis pedis pulses are 1+ on the right side and 1+ on the left side.     Heart sounds: Normal heart sounds. No murmur heard.   Pulmonary:     Effort: Pulmonary effort is normal. No respiratory distress.     Breath sounds: Normal breath sounds. No wheezing or rales.  Chest:     Chest wall: No deformity, swelling, tenderness or edema.  Breasts:     Right: Normal.     Left: Mass present.     Abdominal:     General: Bowel sounds are normal. There is no distension.     Palpations: Abdomen is soft.     Tenderness: There is abdominal tenderness in the right lower quadrant and left lower quadrant. There is no right CVA tenderness, left CVA tenderness, guarding or rebound.  Genitourinary:    Exam position: Knee-chest position.     Rectum: Tenderness present.     Comments: Hemoccult positive Musculoskeletal:        General: No deformity.     Cervical back: Normal range of motion and neck supple. No rigidity, tenderness or crepitus. No pain with movement, spinous process tenderness or muscular tenderness.     Right lower leg: No edema.     Left lower leg: No edema.  Lymphadenopathy:     Cervical: No cervical adenopathy.  Skin:    General: Skin is warm and dry.     Capillary Refill: Capillary refill takes less than 2 seconds.  Neurological:     General: No focal deficit present.     Mental Status: She is alert and oriented to person, place, and time. Mental status is at baseline.     Sensory: Sensation is intact.     Motor: Motor function is intact.  Psychiatric:        Mood and Affect: Mood  normal.    ED Results / Procedures / Treatments   Labs (all labs ordered are listed, but only abnormal results are displayed) Labs Reviewed  COMPREHENSIVE METABOLIC PANEL - Abnormal; Notable for the following components:      Result Value   Glucose, Bld 228 (*)    AST 14 (*)    All other components within normal limits  CBC WITH DIFFERENTIAL/PLATELET - Abnormal; Notable for the following components:  RBC 2.72 (*)    Hemoglobin 6.2 (*)    HCT 20.9 (*)    MCV 76.8 (*)    MCH 22.8 (*)    MCHC 29.7 (*)    Platelets 419 (*)    All other components within normal limits  LACTIC ACID, PLASMA - Abnormal; Notable for the following components:   Lactic Acid, Venous 2.2 (*)    All other components within normal limits  URINALYSIS, ROUTINE W REFLEX MICROSCOPIC - Abnormal; Notable for the following components:   Color, Urine COLORLESS (*)    Specific Gravity, Urine 1.004 (*)    All other components within normal limits  IRON AND TIBC - Abnormal; Notable for the following components:   Iron 10 (*)    TIBC 475 (*)    Saturation Ratios 2 (*)    All other components within normal limits  RETICULOCYTES - Abnormal; Notable for the following components:   RBC. 2.80 (*)    Immature Retic Fract 39.5 (*)    All other components within normal limits  POC OCCULT BLOOD, ED - Abnormal; Notable for the following components:   Fecal Occult Bld POSITIVE (*)    All other components within normal limits  RESP PANEL BY RT-PCR (FLU A&B, COVID) ARPGX2  PROTIME-INR  CBC  HEMOGLOBIN A1C  TYPE AND SCREEN  PREPARE RBC (CROSSMATCH)  ABO/RH    EKG None  Radiology CT Abdomen Pelvis W Contrast  Result Date: 12/09/2020 CLINICAL DATA:  Abdominal pain. Nonlocalized. Low hemoglobin. History of vaginal cancer breast cancer. Hysterectomy and appendectomy EXAM: CT ABDOMEN AND PELVIS WITH CONTRAST TECHNIQUE: Multidetector CT imaging of the abdomen and pelvis was performed using the standard protocol following  bolus administration of intravenous contrast. CONTRAST:  100 mL Omnipaque COMPARISON:  CT abdomen 04/24/2007 FINDINGS: Lower chest: Lung bases are clear. Large mass within the LEFT breast measures 4.6 x 6.2 cm. Hepatobiliary: No focal hepatic lesion. No biliary duct dilatation. Common bile duct is normal. Pancreas: Pancreas is normal. No ductal dilatation. No pancreatic inflammation. Spleen: Normal spleen Adrenals/urinary tract: Adrenal glands normal. There is cortical thinning and scarring in the medial LEFT kidney. No hydronephrosis. Ureters and bladder normal. Stomach/Bowel: Stomach, duodenum small-bowel normal. There multiple diverticula of the ascending, transverse and descending colon without acute inflammation. Rectum normal. No bowel obstruction or inflammation. Vascular/Lymphatic: Abdominal aorta is normal caliber with atherosclerotic calcification. There is no retroperitoneal or periportal lymphadenopathy. No pelvic lymphadenopathy. Reproductive: Post hysterectomy.  Adnexa unremarkable Other: No free fluid. Musculoskeletal: No aggressive osseous lesion. IMPRESSION: 1. No acute findings in the abdomen pelvis. 2. Large 6 cm LEFT breast mass consistent with breast carcinoma. 3. Extensive pancolonic diverticulosis without evidence acute diverticulitis. 4.  Aortic Atherosclerosis (ICD10-I70.0). Electronically Signed   By: Suzy Bouchard M.D.   On: 12/09/2020 15:23    Procedures Procedures (including critical care time)  Medications Ordered in ED Medications  pantoprazole (PROTONIX) injection 40 mg (40 mg Intravenous Not Given 12/09/20 1823)  acetaminophen (TYLENOL) tablet 650 mg (has no administration in time range)    Or  acetaminophen (TYLENOL) suppository 650 mg (has no administration in time range)  hydrALAZINE (APRESOLINE) tablet 25 mg (has no administration in time range)  oxyCODONE (Oxy IR/ROXICODONE) immediate release tablet 5 mg (has no administration in time range)  0.9 %  sodium  chloride infusion (10 mL/hr Intravenous New Bag/Given (Non-Interop) 12/09/20 1824)  morphine 4 MG/ML injection 4 mg (4 mg Intravenous Given 12/09/20 1413)  ondansetron (ZOFRAN) injection 4 mg (4 mg Intravenous  Given 12/09/20 1412)  iohexol (OMNIPAQUE) 300 MG/ML solution 100 mL (100 mLs Intravenous Contrast Given 12/09/20 1454)  morphine 4 MG/ML injection 4 mg (4 mg Intravenous Given 12/09/20 1825)  ondansetron (ZOFRAN) injection 4 mg (4 mg Intravenous Given 12/09/20 1825)    ED Course  I have reviewed the triage vital signs and the nursing notes.  Pertinent labs & imaging results that were available during my care of the patient were reviewed by me and considered in my medical decision making (see chart for details).  Clinical Course as of 12/09/20 1936  Fri Dec 09, 3825  [id number] 74 year old female with intermittent GI bleeding for the past few months.  No associated with some low abdominal pain into the back.  Tachycardic and pale here.  Hemoglobin low at 6.2.  Will need GI evaluation and transfusion. [MB]  1610 Admission consult called 54 minutes ago, without call back.  I have asked secretary to please re-call a consult. [RS]    Clinical Course User Index [MB] Hayden Rasmussen, MD [RS] Kawanna Christley, Sharlene Dory   MDM Rules/Calculators/A&P                         74 year old female with known intermittent chronic GI bleed for last few months who presents at prompting her primary care doctor for hemoglobin <7 in outpatient setting.  Complaint of nausea, dizziness, weakness, bright red blood on the toilet tissue today, however no frank bleeding per rectum today.   Hypertensive on intake, tachycardic to 116.  Physical exam significant for bilateral lower abdominal tenderness to palpation, tachycardia, frank bright red blood on my glove after digital rectal exam.  We will proceed with basic laboratory studies, type and crossmatch, blood transfusion.  Analgesia offered as well as  antiemetic.   We will proceed with CT scan of the abdomen pelvis as well.  CBC with severe anemia with hemoglobin of 6.2.  CMP unremarkable.  Lactic acid elevated to 2.2.  Type and screen performed.  Awaiting release of blood from blood bank.  UA unremarkable.  EKG pending.  CT abdomen pelvis revealed pancolonic diverticulosis without evidence of diverticulitis.  Additionally incidental finding of a large 6 cm left breast mass consistent with breast carcinoma.  Patient is currently receiving blood transfusion.  GI consult to Dr. Laural Golden, who recommends admission to the hospital with plan for EGD/colonoscopy tomorrow.  Recommend clear fluid diet until midnight, then n.p.o. after midnight.  I appreciate his collaboration in the care of this patient.  Following incidental finding on CT, informed patient of concern for new malignancy in her breast.  At the time of my physical exam, the patient revealed to me a large breast mass, that is eroding through the skin on her left lateral breast.  She states it has been there about 1 year, she called it "my cyst, nothing to worry about".  However she did not seem surprised when I informed her that was highly suspicious for malignancy.  My concern is that her relatively new GI bleed in the last few months is secondary to metastases outside of the breast.  Patient requesting oncology consult while inpatient.  Consult placed to Dr. Roosevelt Locks, hospitalist, who is agreeable to seeing this patient in the emergency department and admitting her to his service.  I appreciate his collaboration care of this patient.  Analysia voiced understanding of her medical evaluation and treatment plan.  Each of her questions were answered to her expressed dissection.  She is amenable to plan for admission to the hospital at this time.  Patient is stable and ready for transfer to the floor.  Final Clinical Impression(s) / ED Diagnoses Final diagnoses:  None    Rx / DC  Orders ED Discharge Orders    None       Aura Dials 12/09/20 1936    Hayden Rasmussen, MD 12/10/20 [phone]

## 2020-12-09 NOTE — ED Notes (Signed)
CRITICAL VALUE ALERT  Critical Value:  Lactic acid 2.2  Date & Time Notied: 12/09/20  1312  Provider Notified:[email or web address]  Orders Received/Actions taken:

## 2020-12-09 NOTE — ED Triage Notes (Signed)
Pt c/o symptomatic anemia and was referred by Dr. Nevada Crane for a Hbg less than 7.  Pt states she has been feeling bad for 6 months and she has a GI bleed.

## 2020-12-09 NOTE — ED Notes (Signed)
CRITICAL VALUE ALERT  Critical Value:  6.2 hgb  Date & Time Notied:  12/09/20 1318  Provider Notified: Milton Ferguson  Orders Received/Actions taken:

## 2020-12-09 NOTE — Progress Notes (Signed)
Pt arrives to room #322 via stretcher from ED. Alert, oriented.

## 2020-12-09 NOTE — Progress Notes (Signed)
PT oriented to room and safety precautions, verbalizes understanding. Telemetry applied, NSR noted in 80's. Purewick in place. VSS.

## 2020-12-09 NOTE — H&P (Signed)
History and Physical    Brittney Carroll ATF:[id number] DOB: 12-17-1946 DOA: 12/09/2020  PCP: Celene Squibb, MD (Confirm with patient/family/NH records and if not entered, this has to be entered at [facility] point of entry) Patient coming from: Home  I have personally briefly reviewed patient's old medical records in [place]  Chief Complaint: Rectal bleed  HPI: Brittney Carroll is a 74 y.o. female with medical history significant of diverticulosis, HTN, left breast CA, presented with recurrent rectal bleeding and feeling weakness. Patient has had multiple rectal bleeding since summer 2021, usually last for 2 to 3 days with, frank red blood per rectum large quantity and then resolved by its own. No abdominal pain. Rectal bleeding issue tend to come back every once a while, as her estimation once in between 2 to 4 weeks, she did not seek any medical advice until last week she went to see her PCP, and today's blood work showed hemoglobin less than 7.  Patient has been having bilateral hip pain, which very much reduced her activity to homebound, she does not drive and totally dependent on [place] transportation for her or her activities. She used to see pain management doctor for bilateral hip pain and getting narcotic therapy, but due to transportation problem she is elected not to continue narcotics but rather taking aspirin 81 mg x 2 tablet every 4 hours for the hip pain.  She is been noting seeing a nodule on her left breast starting from the small size now growing into size of tennis ball. She did not want to tell her family "they are too busy with their life"  ED Course: CT abd showed diverticulosis, hemoglobin 6.2 and patient is getting transfusion.  Review of Systems: As per HPI otherwise 14 point review of systems negative.    Past Medical History:  Diagnosis Date  . Arthritis   . Breast mass, left   . Cancer ([place])    vaginal 2008  . Diabetes mellitus without complication ([place])   .  Hyperlipidemia   . Hypertension     Past Surgical History:  Procedure Laterality Date  . ABDOMINAL HYSTERECTOMY    . APPENDECTOMY    . TONSILLECTOMY       reports that she has quit smoking. Her smoking use included cigarettes. She has a 10.50 pack-year smoking history. She has never used smokeless tobacco. She reports previous alcohol use. She reports that she does not use drugs.  Allergies  Allergen Reactions  . Atenolol Other (See Comments)    Makes BP drop suddenly and feels lifeless    History reviewed. No pertinent family history.   Prior to Admission medications   Medication Sig Start Date End Date Taking? Authorizing Provider  lisinopril (PRINIVIL,ZESTRIL) 10 MG tablet Take 10 mg by mouth daily.   Yes [provider]    Physical Exam: Vitals:   12/09/20 1500 12/09/20 1530 12/09/20 1607 12/09/20 1623  BP: (!) 145/47 (!) 160/72 (!) 150/61 (!) 152/91  Pulse: (!) 106 97 99 100  Resp: 17 19 (!) 23 16  Temp:   97.9 F (36.6 C) 97.8 F (36.6 C)  TempSrc:   Oral Oral  SpO2: 98% 98% 97% 95%  Weight:      Height:        Constitutional: NAD, calm, comfortable Vitals:   12/09/20 1500 12/09/20 1530 12/09/20 1607 12/09/20 1623  BP: (!) 145/47 (!) 160/72 (!) 150/61 (!) 152/91  Pulse: (!) 106 97 99 100  Resp: 17  19 (!) 23 16  Temp:   97.9 F (36.6 C) 97.8 F (36.6 C)  TempSrc:   Oral Oral  SpO2: 98% 98% 97% 95%  Weight:      Height:       Eyes: PERRL, lids and conjunctivae normal ENMT: Mucous membranes are moist. Posterior pharynx clear of any exudate or lesions.Normal dentition.  Neck: normal, supple, no masses, no thyromegaly Respiratory: clear to auscultation bilaterally, no wheezing, no crackles. Normal respiratory effort. No accessory muscle use.  Cardiovascular: Regular rate and rhythm, no murmurs / rubs / gallops. No extremity edema. 2+ pedal pulses. No carotid bruits.  Abdomen: no tenderness, no masses palpated. No hepatosplenomegaly. Bowel sounds  positive.  Musculoskeletal: no clubbing / cyanosis. No joint deformity upper and lower extremities. Good ROM, no contractures. Normal muscle tone.  Skin: Large mass on left breast with one shallow ulcer. Neurologic: CN 2-12 grossly intact. Sensation intact, DTR normal. Strength 5/5 in all 4.  Psychiatric: Normal judgment and insight. Alert and oriented x 3. Normal mood.     Labs on Admission: I have personally reviewed following labs and imaging studies  CBC: Recent Labs  Lab 12/09/20 1220  WBC 10.4  NEUTROABS 6.4  HGB 6.2*  HCT 20.9*  MCV 76.8*  PLT 790*   Basic Metabolic Panel: Recent Labs  Lab 12/09/20 1220  NA 137  K 3.7  CL 105  CO2 24  GLUCOSE 228*  BUN 19  CREATININE 0.79  CALCIUM 9.3   GFR: Estimated Creatinine Clearance: 68.3 mL/min (by C-G formula based on SCr of 0.79 mg/dL). Liver Function Tests: Recent Labs  Lab 12/09/20 1220  AST 14*  ALT 12  ALKPHOS 83  BILITOT 0.3  PROT 6.8  ALBUMIN 3.5   No results for input(s): LIPASE, AMYLASE in the last 168 hours. No results for input(s): AMMONIA in the last 168 hours. Coagulation Profile: Recent Labs  Lab 12/09/20 1220  INR 1.0   Cardiac Enzymes: No results for input(s): CKTOTAL, CKMB, CKMBINDEX, TROPONINI in the last 168 hours. BNP (last 3 results) No results for input(s): PROBNP in the last 8760 hours. HbA1C: No results for input(s): HGBA1C in the last 72 hours. CBG: No results for input(s): GLUCAP in the last 168 hours. Lipid Profile: No results for input(s): CHOL, HDL, LDLCALC, TRIG, CHOLHDL, LDLDIRECT in the last 72 hours. Thyroid Function Tests: No results for input(s): TSH, T4TOTAL, FREET4, T3FREE, THYROIDAB in the last 72 hours. Anemia Panel: No results for input(s): VITAMINB12, FOLATE, FERRITIN, TIBC, IRON, RETICCTPCT in the last 72 hours. Urine analysis:    Component Value Date/Time   COLORURINE COLORLESS (A) 12/09/2020 1256   APPEARANCEUR CLEAR 12/09/2020 1256   LABSPEC 1.004 (L)  12/09/2020 1256   PHURINE 5.0 12/09/2020 1256   GLUCOSEU NEGATIVE 12/09/2020 1256   HGBUR NEGATIVE 12/09/2020 1256   BILIRUBINUR NEGATIVE 12/09/2020 1256   KETONESUR NEGATIVE 12/09/2020 1256   PROTEINUR NEGATIVE 12/09/2020 1256   NITRITE NEGATIVE 12/09/2020 1256   LEUKOCYTESUR NEGATIVE 12/09/2020 1256    Radiological Exams on Admission: CT Abdomen Pelvis W Contrast  Result Date: 12/09/2020 CLINICAL DATA:  Abdominal pain. Nonlocalized. Low hemoglobin. History of vaginal cancer breast cancer. Hysterectomy and appendectomy EXAM: CT ABDOMEN AND PELVIS WITH CONTRAST TECHNIQUE: Multidetector CT imaging of the abdomen and pelvis was performed using the standard protocol following bolus administration of intravenous contrast. CONTRAST:  100 mL Omnipaque COMPARISON:  CT abdomen 04/24/2007 FINDINGS: Lower chest: Lung bases are clear. Large mass within the LEFT breast measures 4.6  x 6.2 cm. Hepatobiliary: No focal hepatic lesion. No biliary duct dilatation. Common bile duct is normal. Pancreas: Pancreas is normal. No ductal dilatation. No pancreatic inflammation. Spleen: Normal spleen Adrenals/urinary tract: Adrenal glands normal. There is cortical thinning and scarring in the medial LEFT kidney. No hydronephrosis. Ureters and bladder normal. Stomach/Bowel: Stomach, duodenum small-bowel normal. There multiple diverticula of the ascending, transverse and descending colon without acute inflammation. Rectum normal. No bowel obstruction or inflammation. Vascular/Lymphatic: Abdominal aorta is normal caliber with atherosclerotic calcification. There is no retroperitoneal or periportal lymphadenopathy. No pelvic lymphadenopathy. Reproductive: Post hysterectomy.  Adnexa unremarkable Other: No free fluid. Musculoskeletal: No aggressive osseous lesion. IMPRESSION: 1. No acute findings in the abdomen pelvis. 2. Large 6 cm LEFT breast mass consistent with breast carcinoma. 3. Extensive pancolonic diverticulosis without  evidence acute diverticulitis. 4.  Aortic Atherosclerosis (ICD10-I70.0). Electronically Signed   By: Suzy Bouchard M.D.   On: 12/09/2020 15:23    EKG: Pending  Assessment/Plan Active Problems:   GI bleed  (please populate well all problems here in Problem List. (For example, if patient is on BP meds at home and you resume or decide to hold them, it is a problem that needs to be her. Same for CAD, COPD, HLD and so on)  Acute on chronic iron deficiency anemia secondary to recurrent lower GI bleed -Likely from diverticulosis bleeding. [facility] informed, plan to scope patient tomorrow. Discussed with patient regarding indication for partial colectomy for since that patient appears to have multiple such bleeding episode. Patient adamant that she does not want any intra-abdominal surgeries. -N.p.o. after midnight.  Breast CA -Outpatient oncology and surgery consult -Consult case management for transportation and other home needs.  Denial? -Patient very emotional and appears to be in a denial mood.  Discussed with patient's son over the phone, it appears that that the patient's family all aware the patient GI bleed problems and breast mass and hip problems and offered many times to take her to doctor (s), she all refused. -Probably will need psychiatry at later point to screen for depression.  B/L hip OA -Start Narcotic therapy, and NSAID is of increasing GI bleed risk in her case -PT evaluation.  HTN -Hold home BP meds for now -PRN Hydralazine  Elevated Glucose -Check A1C   DVT prophylaxis: SCD Code Status: Full Code Family Communication:  Disposition Plan: Son over phone Consults called: GI bleed Admission status: Tele admit   Lequita Halt MD [facility] Pager [phone]  12/09/2020, 5:06 PM

## 2020-12-09 NOTE — ED Notes (Signed)
ED TO INPATIENT HANDOFF REPORT  ED Nurse Name and Phone #: Angelica Pou [phone]  S Name/Age/Gender Brittney Carroll 74 y.o. female Room/Bed: APA17/APA17  Code Status   Code Status: Full Code  Home/SNF/Other Home Patient oriented to: self, place, time and situation Is this baseline? Yes   Triage Complete: Triage complete  Chief Complaint GI bleed [K92.2]  Triage Note Pt c/o symptomatic anemia and was referred by Dr. Nevada Crane for a Hbg less than 7.  Pt states she has been feeling bad for 6 months and she has a GI bleed.    Allergies Allergies  Allergen Reactions  . Atenolol Other (See Comments)    Makes BP drop suddenly and feels lifeless    Level of Care/Admitting Diagnosis ED Disposition    ED Disposition Condition [place]: [facility] [[id number]]  Level of Care: Telemetry [5]  Covid Evaluation: Asymptomatic Screening Protocol (No Symptoms)  Diagnosis: GI bleed [606301]  Admitting Physician: Lequita Halt [[id number]]  Attending Physician: Lequita Halt [[id number]]  Estimated length of stay: past midnight tomorrow  Certification:: I certify this patient will need inpatient services for at least 2 midnights       B Medical/Surgery History Past Medical History:  Diagnosis Date  . Arthritis   . Breast mass, left   . Cancer ([place])    vaginal 2008  . Diabetes mellitus without complication ([place])   . Hyperlipidemia   . Hypertension    Past Surgical History:  Procedure Laterality Date  . ABDOMINAL HYSTERECTOMY    . APPENDECTOMY    . TONSILLECTOMY       A IV Location/Drains/Wounds Patient Lines/Drains/Airways Status    Active Line/Drains/Airways    Name Placement date Placement time Site Days   Peripheral IV 12/09/20 Left Antecubital 12/09/20  1226  Antecubital  less than 1   Peripheral IV 12/09/20 12/09/20  1454  --  less than 1          Intake/Output Last 24 hours No intake or output data in the 24 hours ending 12/09/20  1652  Labs/Imaging Results for orders placed or performed during the hospital encounter of 12/09/20 (from the past 48 hour(s))  Comprehensive metabolic panel     Status: Abnormal   Collection Time: 12/09/20 12:20 PM  Result Value Ref Range   Sodium 137 135 - 145 mmol/L   Potassium 3.7 3.5 - 5.1 mmol/L   Chloride 105 98 - 111 mmol/L   CO2 24 22 - 32 mmol/L   Glucose, Bld 228 (H) 70 - 99 mg/dL    Comment: Glucose reference range applies only to samples taken after fasting for at least 8 hours.   BUN 19 8 - 23 mg/dL   Creatinine, Ser 0.79 0.44 - 1.00 mg/dL   Calcium 9.3 8.9 - 10.3 mg/dL   Total Protein 6.8 6.5 - 8.1 g/dL   Albumin 3.5 3.5 - 5.0 g/dL   AST 14 (L) 15 - 41 U/L   ALT 12 0 - 44 U/L   Alkaline Phosphatase 83 38 - 126 U/L   Total Bilirubin 0.3 0.3 - 1.2 mg/dL   GFR, Estimated >60 >60 mL/min    Comment: (NOTE) Calculated using the CKD-EPI Creatinine Equation (2021)    Anion gap 8 5 - 15    Comment: Performed at [facility], [address] [place], [place] [address]  CBC WITH DIFFERENTIAL     Status: Abnormal   Collection Time: 12/09/20 12:20 PM  Result  Value Ref Range   WBC 10.4 4.0 - 10.5 K/uL   RBC 2.72 (L) 3.87 - 5.11 MIL/uL   Hemoglobin 6.2 (LL) 12.0 - 15.0 g/dL    Comment: Reticulocyte Hemoglobin testing may be clinically indicated, consider ordering this additional test [id number] THIS CRITICAL RESULT HAS VERIFIED AND BEEN CALLED TO GENTRY,R BY BOBBIE MATTHEWS ON 12 24 2021 AT 1316, AND HAS BEEN READ BACK.     HCT 20.9 (L) 36.0 - 46.0 %   MCV 76.8 (L) 80.0 - 100.0 fL   MCH 22.8 (L) 26.0 - 34.0 pg   MCHC 29.7 (L) 30.0 - 36.0 g/dL   RDW 15.1 11.5 - 15.5 %   Platelets 419 (H) 150 - 400 K/uL   nRBC 0.0 0.0 - 0.2 %   Neutrophils Relative % 61 %   Neutro Abs 6.4 1.7 - 7.7 K/uL   Lymphocytes Relative 26 %   Lymphs Abs 2.7 0.7 - 4.0 K/uL   Monocytes Relative 10 %   Monocytes Absolute 1.0 0.1 - 1.0 K/uL   Eosinophils Relative 2 %   Eosinophils Absolute 0.3 0.0  - 0.5 K/uL   Basophils Relative 1 %   Basophils Absolute 0.1 0.0 - 0.1 K/uL   Immature Granulocytes 0 %   Abs Immature Granulocytes 0.04 0.00 - 0.07 K/uL    Comment: Performed at [facility], [address] [place], [place] [address]  Protime-INR     Status: None   Collection Time: 12/09/20 12:20 PM  Result Value Ref Range   Prothrombin Time 13.2 11.4 - 15.2 seconds   INR 1.0 0.8 - 1.2    Comment: (NOTE) INR goal varies based on device and disease states. Performed at [facility], [address], [place], [place] [address]   Type and screen [facility]     Status: None (Preliminary result)   Collection Time: 12/09/20 12:20 PM  Result Value Ref Range   ABO/RH(D) B POS    Antibody Screen NEG    Sample Expiration 12/12/2020,2359    Unit Number [id number]    Blood Component Type RED CELLS,LR    Unit division 00    Status of Unit ALLOCATED    Transfusion Status OK TO TRANSFUSE    Crossmatch Result Compatible    Unit Number [id number]    Blood Component Type RED CELLS,LR    Unit division 00    Status of Unit ISSUED    Transfusion Status OK TO TRANSFUSE    Crossmatch Result      Compatible Performed at [facility], [address] [place], [place] [address]   Lactic acid, plasma     Status: Abnormal   Collection Time: 12/09/20 12:20 PM  Result Value Ref Range   Lactic Acid, Venous 2.2 (HH) 0.5 - 1.9 mmol/L    Comment: CRITICAL RESULT CALLED TO, READ BACK BY AND VERIFIED WITH: GENTRY,R AT 1310 BY HUFFINES,S ON 12.24.21 Performed at [facility], [address], [place], [place] [address]   Resp Panel by RT-PCR (Flu A&B, Covid) Nasopharyngeal Swab     Status: None   Collection Time: 12/09/20 12:53 PM   Specimen: Nasopharyngeal Swab; Nasopharyngeal(NP) swabs in vial transport medium  Result Value Ref Range   SARS Coronavirus 2 by RT PCR NEGATIVE NEGATIVE    Comment: (NOTE) SARS-CoV-2 target nucleic acids are NOT DETECTED.  The SARS-CoV-2 RNA is generally  detectable in upper respiratory specimens during the acute phase of infection. The lowest concentration of SARS-CoV-2 viral copies this assay  can detect is 138 copies/mL. A negative result does not preclude SARS-Cov-2 infection and should not be used as the sole basis for treatment or other patient management decisions. A negative result may occur with  improper specimen collection/handling, submission of specimen other than nasopharyngeal swab, presence of viral mutation(s) within the areas targeted by this assay, and inadequate number of viral copies(<138 copies/mL). A negative result must be combined with clinical observations, patient history, and epidemiological information. The expected result is Negative.  Fact Sheet for Patients:  EntrepreneurPulse.com.au  Fact Sheet for Healthcare Providers:  IncredibleEmployment.be  This test is no t yet approved or cleared by the [place] FDA and  has been authorized for detection and/or diagnosis of SARS-CoV-2 by FDA under an Emergency Use Authorization (EUA). This EUA will remain  in effect (meaning this test can be used) for the duration of the COVID-19 declaration under Section 564(b)(1) of the Act, 21 U.S.C.section 360bbb-3(b)(1), unless the authorization is terminated  or revoked sooner.       Influenza A by PCR NEGATIVE NEGATIVE   Influenza B by PCR NEGATIVE NEGATIVE    Comment: (NOTE) The Xpert Xpress SARS-CoV-2/FLU/RSV plus assay is intended as an aid in the diagnosis of influenza from Nasopharyngeal swab specimens and should not be used as a sole basis for treatment. Nasal washings and aspirates are unacceptable for Xpert Xpress SARS-CoV-2/FLU/RSV testing.  Fact Sheet for Patients: EntrepreneurPulse.com.au  Fact Sheet for Healthcare Providers: IncredibleEmployment.be  This test is not yet approved or cleared by the [place] FDA and has  been authorized for detection and/or diagnosis of SARS-CoV-2 by FDA under an Emergency Use Authorization (EUA). This EUA will remain in effect (meaning this test can be used) for the duration of the COVID-19 declaration under Section 564(b)(1) of the Act, 21 U.S.C. section 360bbb-3(b)(1), unless the authorization is terminated or revoked.  Performed at [facility], [address], [place], [place] [address]   Prepare RBC (crossmatch)     Status: None   Collection Time: 12/09/20 12:53 PM  Result Value Ref Range   Order Confirmation      ORDER PROCESSED BY BLOOD BANK Performed at [facility], [address] [place], [place] [address]   Urinalysis, Routine w reflex microscopic     Status: Abnormal   Collection Time: 12/09/20 12:56 PM  Result Value Ref Range   Color, Urine COLORLESS (A) YELLOW   APPearance CLEAR CLEAR   Specific Gravity, Urine 1.004 (L) 1.005 - 1.030   pH 5.0 5.0 - 8.0   Glucose, UA NEGATIVE NEGATIVE mg/dL   Hgb urine dipstick NEGATIVE NEGATIVE   Bilirubin Urine NEGATIVE NEGATIVE   Ketones, ur NEGATIVE NEGATIVE mg/dL   Protein, ur NEGATIVE NEGATIVE mg/dL   Nitrite NEGATIVE NEGATIVE   Leukocytes,Ua NEGATIVE NEGATIVE    Comment: Performed at [facility], [address], [place], [place] [address]  ABO/Rh     Status: None   Collection Time: 12/09/20  1:57 PM  Result Value Ref Range   ABO/RH(D)      B POS Performed at [facility], [address], [place], [place] [address]   POC occult blood, ED     Status: Abnormal   Collection Time: 12/09/20  3:20 PM  Result Value Ref Range   Fecal Occult Bld POSITIVE (A) NEGATIVE   CT Abdomen Pelvis W Contrast  Result Date: 12/09/2020 CLINICAL DATA:  Abdominal pain. Nonlocalized. Low hemoglobin. History of vaginal cancer breast cancer. Hysterectomy and appendectomy EXAM: CT ABDOMEN AND PELVIS  WITH CONTRAST TECHNIQUE: Multidetector CT imaging of the abdomen and pelvis was performed using the standard protocol following bolus  administration of intravenous contrast. CONTRAST:  100 mL Omnipaque COMPARISON:  CT abdomen 04/24/2007 FINDINGS: Lower chest: Lung bases are clear. Large mass within the LEFT breast measures 4.6 x 6.2 cm. Hepatobiliary: No focal hepatic lesion. No biliary duct dilatation. Common bile duct is normal. Pancreas: Pancreas is normal. No ductal dilatation. No pancreatic inflammation. Spleen: Normal spleen Adrenals/urinary tract: Adrenal glands normal. There is cortical thinning and scarring in the medial LEFT kidney. No hydronephrosis. Ureters and bladder normal. Stomach/Bowel: Stomach, duodenum small-bowel normal. There multiple diverticula of the ascending, transverse and descending colon without acute inflammation. Rectum normal. No bowel obstruction or inflammation. Vascular/Lymphatic: Abdominal aorta is normal caliber with atherosclerotic calcification. There is no retroperitoneal or periportal lymphadenopathy. No pelvic lymphadenopathy. Reproductive: Post hysterectomy.  Adnexa unremarkable Other: No free fluid. Musculoskeletal: No aggressive osseous lesion. IMPRESSION: 1. No acute findings in the abdomen pelvis. 2. Large 6 cm LEFT breast mass consistent with breast carcinoma. 3. Extensive pancolonic diverticulosis without evidence acute diverticulitis. 4.  Aortic Atherosclerosis (ICD10-I70.0). Electronically Signed   By: Suzy Bouchard M.D.   On: 12/09/2020 15:23    Pending Labs Unresulted Labs (From admission, onward)          Start     Ordered   12/10/20 0500  CBC  Tomorrow morning,   R        12/09/20 1649          Vitals/Pain Today's Vitals   12/09/20 1530 12/09/20 1552 12/09/20 1607 12/09/20 1623  BP: (!) 160/72  (!) 150/61 (!) 152/91  Pulse: 97  99 100  Resp: 19  (!) 23 16  Temp:   97.9 F (36.6 C) 97.8 F (36.6 C)  TempSrc:   Oral Oral  SpO2: 98%  97% 95%  Weight:      Height:      PainSc:  2       Isolation Precautions No active isolations  Medications Medications  0.9 %   sodium chloride infusion (has no administration in time range)  pantoprazole (PROTONIX) injection 40 mg (has no administration in time range)  acetaminophen (TYLENOL) tablet 650 mg (has no administration in time range)    Or  acetaminophen (TYLENOL) suppository 650 mg (has no administration in time range)  hydrALAZINE (APRESOLINE) tablet 25 mg (has no administration in time range)  morphine 4 MG/ML injection 4 mg (4 mg Intravenous Given 12/09/20 1413)  ondansetron (ZOFRAN) injection 4 mg (4 mg Intravenous Given 12/09/20 1412)  iohexol (OMNIPAQUE) 300 MG/ML solution 100 mL (100 mLs Intravenous Contrast Given 12/09/20 1454)    Mobility walks with device Low fall risk   Focused Assessments    R Recommendations: See Admitting Provider Note  Report given to:   Additional Notes:

## 2020-12-10 ENCOUNTER — Encounter (HOSPITAL_COMMUNITY): Payer: Self-pay | Admitting: Internal Medicine

## 2020-12-10 DIAGNOSIS — K922 Gastrointestinal hemorrhage, unspecified: Secondary | ICD-10-CM

## 2020-12-10 DIAGNOSIS — K5791 Diverticulosis of intestine, part unspecified, without perforation or abscess with bleeding: Secondary | ICD-10-CM | POA: Diagnosis not present

## 2020-12-10 DIAGNOSIS — D509 Iron deficiency anemia, unspecified: Secondary | ICD-10-CM

## 2020-12-10 DIAGNOSIS — C50412 Malignant neoplasm of upper-outer quadrant of left female breast: Secondary | ICD-10-CM | POA: Diagnosis not present

## 2020-12-10 DIAGNOSIS — N6320 Unspecified lump in the left breast, unspecified quadrant: Secondary | ICD-10-CM

## 2020-12-10 LAB — CBC
HCT: 25.1 % — ABNORMAL LOW (ref 36.0–46.0)
Hemoglobin: 7.4 g/dL — ABNORMAL LOW (ref 12.0–15.0)
MCH: 23.3 pg — ABNORMAL LOW (ref 26.0–34.0)
MCHC: 29.5 g/dL — ABNORMAL LOW (ref 30.0–36.0)
MCV: 78.9 fL — ABNORMAL LOW (ref 80.0–100.0)
Platelets: 371 10*3/uL (ref 150–400)
RBC: 3.18 MIL/uL — ABNORMAL LOW (ref 3.87–5.11)
RDW: 15.6 % — ABNORMAL HIGH (ref 11.5–15.5)
WBC: 9.1 10*3/uL (ref 4.0–10.5)
nRBC: 0 % (ref 0.0–0.2)

## 2020-12-10 MED ORDER — OXYCODONE HCL 5 MG PO TABS
5.0000 mg | ORAL_TABLET | ORAL | Status: DC | PRN
  Administered 2020-12-10 – 2020-12-14 (×7): 5 mg via ORAL
  Filled 2020-12-10 (×7): qty 1

## 2020-12-10 MED ORDER — PEG 3350-KCL-NA BICARB-NACL 420 G PO SOLR
4000.0000 mL | Freq: Once | ORAL | Status: AC
  Administered 2020-12-11: 4000 mL via ORAL

## 2020-12-10 MED ORDER — COVID-19 MRNA VACC (MODERNA) 100 MCG/0.5ML IM SUSP
0.5000 mL | Freq: Once | INTRAMUSCULAR | Status: AC
  Filled 2020-12-10: qty 0.5

## 2020-12-10 MED ORDER — HYDROMORPHONE HCL 1 MG/ML IJ SOLN
0.5000 mg | INTRAMUSCULAR | Status: DC | PRN
  Administered 2020-12-10 – 2020-12-14 (×13): 0.5 mg via INTRAVENOUS
  Filled 2020-12-10 (×13): qty 0.5

## 2020-12-10 MED ORDER — MAGNESIUM CITRATE PO SOLN
1.0000 | Freq: Once | ORAL | Status: AC
  Administered 2020-12-10: 1 via ORAL
  Filled 2020-12-10: qty 296

## 2020-12-10 NOTE — Consult Note (Signed)
Referring Provider: Irwin Brakeman, MD Primary Care Physician:  Celene Squibb, MD Primary Gastroenterologist:  Dr. Laural Golden  Reason for Consultation:   GI bleed and iron deficiency anemia  HPI:   Patient is 74 year old Caucasian female who had routine blood work by Dr. Thedore Mins call her primary care physician and was found to have hemoglobin of 6.8 g.  Patient was advised to come to emergency room which she did yesterday.  Her hemoglobin was 6.2 g.  Her stool was guaiac positive.  She was admitted for further evaluation.  She has received single unit of PRBCs her hemoglobin is up to 7.4.  Patient was begun on IV pantoprazole.  She also had abdominal CT which did not reveal any abdominal pelvic abnormality but did reveal mass in left breast.  Iron studies confirmed iron deficiency anemia. Patient states she has been passing tarry stools as well as bright red blood per rectum for the last 6 months.  It can occur daily for few days and she can go few weeks without seeing blood.  She can have tarry stool and bright red blood at the same time or she can have it at different times.  Her last colonoscopy was over 20 years ago.  She is prone to constipation and takes mag citrate occasionally.  She says her appetite is good.  She has been watching her diet.  She has lost few pounds recently.  She denies nausea vomiting heartburn dysphagia or abdominal pain. She says she has been taking 2 aspirins every 4 hours while she is awake for intractable bilateral hip pain as well as left shoulder pain.  She has severe arthritis involving both hip joints and she may also have avascular necrosis to left femoral head. Patient is on narcotic therapy.  She has to go to Dr. Juel Burrow office every month to pick up her prescription.  She says she is just not able to do so.  She says she does not have social support mechanism.  She lives alone. Review of the systems is also positive for enlarging left breast mass which was first noted 1  year ago when she started with bleeding via left nipple.  She has declined to have further evaluation.  She was evaluated by Dr. Fredirick Maudlin of surgical service earlier today.  Patient has declined further evaluation. Patient is interested in GI evaluation to look for source of bleeding.  However she has decided that she would not undergo surgery if she is found to have colon carcinoma.  She is divorced.  She has 2 daughters and 1 son who are all in good health.  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  She is now retired.  She smoked cigarettes about 1 pack/day for less than 10 years and quit 50 years ago.  She does not drink alcohol.  Her father who is a physician develop dementia in his late 71s and died at 44.  Mother has surgery for ruptured appendix and had complications including MI and died age 73.  She has a brother age 19 in good health.  She has stools sisters living.  1 sister age 75 is in poor health.  Other sister is 68 years old and doing reasonably well.  Third sister died of metastatic breast carcinoma with 1 year of diagnosis.  She was in her 92s.  Past Medical History:  Diagnosis Date  . Arthritis   . Breast  mass, left   . Cancer ([place])    vaginal 2008  . Diabetes mellitus without complication ([place])   . Hyperlipidemia   . Hypertension       Endometrial cancer in 2008.  Past Surgical History:  Procedure Laterality Date  . ABDOMINAL HYSTERECTOMY    . APPENDECTOMY    . TONSILLECTOMY      Prior to Admission medications   Medication Sig Start Date End Date Taking? Authorizing Provider  lisinopril (PRINIVIL,ZESTRIL) 10 MG tablet Take 10 mg by mouth daily.   Yes [provider]    Current Facility-Administered Medications  Medication Dose Route Frequency Provider Last Rate Last Admin  . acetaminophen (TYLENOL) tablet 650 mg  650 mg Oral Q6H PRN Lequita Halt, MD       Or  .  acetaminophen (TYLENOL) suppository 650 mg  650 mg Rectal Q6H PRN Lequita Halt, MD      . Derrill Memo ON 12/11/2020] COVID-19 mRNA vaccine (Moderna) injection 0.5 mL  0.5 mL Intramuscular ONCE-1600 Johnson, Clanford L, MD      . hydrALAZINE (APRESOLINE) tablet 25 mg  25 mg Oral Q6H PRN Wynetta Fines T, MD      . HYDROmorphone (DILAUDID) injection 0.5 mg  0.5 mg Intravenous Q3H PRN Johnson, Clanford L, MD   0.5 mg at 12/10/20 1141  . magnesium citrate solution 1 Bottle  1 Bottle Oral Once Rowyn Mustapha U, MD      . oxyCODONE (Oxy IR/ROXICODONE) immediate release tablet 5 mg  5 mg Oral Q3H PRN Johnson, Clanford L, MD      . pantoprazole (PROTONIX) injection 40 mg  40 mg Intravenous Q12H Wynetta Fines T, MD      . Derrill Memo ON 12/11/2020] polyethylene glycol-electrolytes (NuLYTELY) solution 4,000 mL  4,000 mL Oral Once Rogene Houston, MD        Allergies as of 12/09/2020 - Review Complete 12/09/2020  Allergen Reaction Noted  . Atenolol Other (See Comments) 09/15/2015    Family History  Problem Relation Age of Onset  . Breast cancer Sister        Deceased in her 60s    Social History   Socioeconomic History  . Marital status: Divorced    Spouse name: Not on file  . Number of children: Not on file  . Years of education: Not on file  . Highest education level: Not on file  Occupational History  . Not on file  Tobacco Use  . Smoking status: Former Smoker    Packs/day: 1.50    Years: 7.00    Pack years: 10.50    Types: Cigarettes  . Smokeless tobacco: Never Used  . Tobacco comment: quit 50 yrs ago  Vaping Use  . Vaping Use: Never used  Substance and Sexual Activity  . Alcohol use: Not Currently  . Drug use: Never  . Sexual activity: Not on file  Other Topics Concern  . Not on file  Social History Narrative  . Not on file   Social Determinants of Health   Financial Resource Strain: Not on file  Food Insecurity: Not on file  Transportation Needs: Not on file  Physical Activity:  Not on file  Stress: Not on file  Social Connections: Not on file  Intimate Partner Violence: Not on file    Review of Systems: See HPI, otherwise normal ROS  Physical Exam: Temp:  [97.8 F (36.6 C)-98.7 F (37.1 C)] 98.7 F (37.1 C) (12/25 0521) Pulse Rate:  [81-96] 86 (12/25  8676) Resp:  [18-25] 18 (12/25 0521) BP: (155-177)/(62-72) 155/68 (12/25 0521) SpO2:  [84 %-100 %] 100 % (12/25 0521) Weight:  [85.1 kg-89.7 kg] 85.1 kg (12/25 0521) Last BM Date: 12/09/20 (pt reported tarry stool)  Patient is alert and in no acute distress. Conjunctiva is pale.  Sclerae nonicteric. Oropharyngeal mucosa is normal Neck without masses thyromegaly or lymphadenopathy. He has large nodular mass in left breast with skin discoloration and small ulcer.  Is no drainage. Cardiac exam with regular rhythm normal S1 and S2.  No murmur or gallop noted. Auscultation lungs reveal vesicular breath sounds bilaterally. Abdomen is full.  Bowel sounds are normal.  On palpation abdomen is soft and nontender with organomegaly or masses. She does not have peripheral edema clubbing or koilonychia.  Lab Results: Recent Labs    12/09/20 1220 12/10/20 0710  WBC 10.4 9.1  HGB 6.2* 7.4*  HCT 20.9* 25.1*  PLT 419* 371   BMET Recent Labs    12/09/20 1220  NA 137  K 3.7  CL 105  CO2 24  GLUCOSE 228*  BUN 19  CREATININE 0.79  CALCIUM 9.3   LFT Recent Labs    12/09/20 1220  PROT 6.8  ALBUMIN 3.5  AST 14*  ALT 12  ALKPHOS 83  BILITOT 0.3   PT/INR Recent Labs    12/09/20 1220  LABPROT 13.2  INR 1.0   Serum iron 10, TIBC 475 and saturation 2%.  Studies/Results: CT Abdomen Pelvis W Contrast  Result Date: 12/09/2020 CLINICAL DATA:  Abdominal pain. Nonlocalized. Low hemoglobin. History of vaginal cancer breast cancer. Hysterectomy and appendectomy EXAM: CT ABDOMEN AND PELVIS WITH CONTRAST TECHNIQUE: Multidetector CT imaging of the abdomen and pelvis was performed using the standard protocol  following bolus administration of intravenous contrast. CONTRAST:  100 mL Omnipaque COMPARISON:  CT abdomen 04/24/2007 FINDINGS: Lower chest: Lung bases are clear. Large mass within the LEFT breast measures 4.6 x 6.2 cm. Hepatobiliary: No focal hepatic lesion. No biliary duct dilatation. Common bile duct is normal. Pancreas: Pancreas is normal. No ductal dilatation. No pancreatic inflammation. Spleen: Normal spleen Adrenals/urinary tract: Adrenal glands normal. There is cortical thinning and scarring in the medial LEFT kidney. No hydronephrosis. Ureters and bladder normal. Stomach/Bowel: Stomach, duodenum small-bowel normal. There multiple diverticula of the ascending, transverse and descending colon without acute inflammation. Rectum normal. No bowel obstruction or inflammation. Vascular/Lymphatic: Abdominal aorta is normal caliber with atherosclerotic calcification. There is no retroperitoneal or periportal lymphadenopathy. No pelvic lymphadenopathy. Reproductive: Post hysterectomy.  Adnexa unremarkable Other: No free fluid. Musculoskeletal: No aggressive osseous lesion. IMPRESSION: 1. No acute findings in the abdomen pelvis. 2. Large 6 cm LEFT breast mass consistent with breast carcinoma. 3. Extensive pancolonic diverticulosis without evidence acute diverticulitis. 4.  Aortic Atherosclerosis (ICD10-I70.0). Electronically Signed   By: Suzy Bouchard M.D.   On: 12/09/2020 15:23    Assessment;  Patient is 74 year old Caucasian who presents with 6 months history of melena and rectal bleeding and profound anemia.  The symptoms have been going on for 6 months.  Patient has intractable hip and shoulder pain and has been taking aspirin 2 tablets every 4 hours since she has not been able to keep her appointment with Dr. Thedore Mins call to get her narcotic prescription.  Iron studies confirm iron deficiency anemia.  She has received 1 unit of PRBCs.  Hemoglobin has only increased from 6.2 to 7.4 g.  She may need another  unit of PRBCs prior to endoscopic evaluation. Differential diagnosis includes peptic  ulcer disease as well as gastric or colonic neoplasm.  Patient is interested in work-up but she has decided she would not agree to surgery if she was found to have colon carcinoma.  Patient has left breast mass with features of carcinoma.  She has declined further work-up.    Chronic pain.  Doctors alcohol has been doing pain management but the issue is that she would not go for a visit every month which I do not believe is that difficult. We will request social service consult to find out if we could do anything to improve quality of her life.  Recommendations;  Social service consult. CBC in a.m.  If hemoglobin remains below 8 g will give another unit of PRBCs prior to endoscopic intervention Diagnostic esophagogastroduodenoscopy and colonoscopy on 12/11/2020 under monitored anesthesia care. Patient advised to refrain from using NSAIDs or aspirin until further notice    LOS: 1 day   Ludwika Rodd  12/10/2020, 5:44 PM

## 2020-12-10 NOTE — H&P (View-Only) (Signed)
Referring Provider: Irwin Brakeman, MD Primary Care Physician:  Celene Squibb, MD Primary Gastroenterologist:  Dr. Laural Golden  Reason for Consultation:   GI bleed and iron deficiency anemia  HPI:   Patient is 74 year old Caucasian female who had routine blood work by Dr. Thedore Mins call her primary care physician and was found to have hemoglobin of 6.8 g.  Patient was advised to come to emergency room which she did yesterday.  Her hemoglobin was 6.2 g.  Her stool was guaiac positive.  She was admitted for further evaluation.  She has received single unit of PRBCs her hemoglobin is up to 7.4.  Patient was begun on IV pantoprazole.  She also had abdominal CT which did not reveal any abdominal pelvic abnormality but did reveal mass in left breast.  Iron studies confirmed iron deficiency anemia. Patient states she has been passing tarry stools as well as bright red blood per rectum for the last 6 months.  It can occur daily for few days and she can go few weeks without seeing blood.  She can have tarry stool and bright red blood at the same time or she can have it at different times.  Her last colonoscopy was over 20 years ago.  She is prone to constipation and takes mag citrate occasionally.  She says her appetite is good.  She has been watching her diet.  She has lost few pounds recently.  She denies nausea vomiting heartburn dysphagia or abdominal pain. She says she has been taking 2 aspirins every 4 hours while she is awake for intractable bilateral hip pain as well as left shoulder pain.  She has severe arthritis involving both hip joints and she may also have avascular necrosis to left femoral head. Patient is on narcotic therapy.  She has to go to Dr. Juel Burrow office every month to pick up her prescription.  She says she is just not able to do so.  She says she does not have social support mechanism.  She lives alone. Review of the systems is also positive for enlarging left breast mass which was first noted 1  year ago when she started with bleeding via left nipple.  She has declined to have further evaluation.  She was evaluated by Dr. Fredirick Maudlin of surgical service earlier today.  Patient has declined further evaluation. Patient is interested in GI evaluation to look for source of bleeding.  However she has decided that she would not undergo surgery if she is found to have colon carcinoma.  She is divorced.  She has 2 daughters and 1 son who are all in good health.  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  She is now retired.  She smoked cigarettes about 1 pack/day for less than 10 years and quit 50 years ago.  She does not drink alcohol.  Her father who is a physician develop dementia in his late 30s and died at 35.  Mother has surgery for ruptured appendix and had complications including MI and died age 74.  She has a brother age 78 in good health.  She has stools sisters living.  1 sister age 75 is in poor health.  Other sister is 57 years old and doing reasonably well.  Third sister died of metastatic breast carcinoma with 1 year of diagnosis.  She was in her 55s.  Past Medical History:  Diagnosis Date  . Arthritis   . Breast  mass, left   . Cancer ([place])    vaginal 2008  . Diabetes mellitus without complication ([place])   . Hyperlipidemia   . Hypertension       Endometrial cancer in 2008.  Past Surgical History:  Procedure Laterality Date  . ABDOMINAL HYSTERECTOMY    . APPENDECTOMY    . TONSILLECTOMY      Prior to Admission medications   Medication Sig Start Date End Date Taking? Authorizing Provider  lisinopril (PRINIVIL,ZESTRIL) 10 MG tablet Take 10 mg by mouth daily.   Yes [provider]    Current Facility-Administered Medications  Medication Dose Route Frequency Provider Last Rate Last Admin  . acetaminophen (TYLENOL) tablet 650 mg  650 mg Oral Q6H PRN Lequita Halt, MD       Or  .  acetaminophen (TYLENOL) suppository 650 mg  650 mg Rectal Q6H PRN Lequita Halt, MD      . Derrill Memo ON 12/11/2020] COVID-19 mRNA vaccine (Moderna) injection 0.5 mL  0.5 mL Intramuscular ONCE-1600 Johnson, Clanford L, MD      . hydrALAZINE (APRESOLINE) tablet 25 mg  25 mg Oral Q6H PRN Wynetta Fines T, MD      . HYDROmorphone (DILAUDID) injection 0.5 mg  0.5 mg Intravenous Q3H PRN Johnson, Clanford L, MD   0.5 mg at 12/10/20 1141  . magnesium citrate solution 1 Bottle  1 Bottle Oral Once Tinsleigh Slovacek U, MD      . oxyCODONE (Oxy IR/ROXICODONE) immediate release tablet 5 mg  5 mg Oral Q3H PRN Johnson, Clanford L, MD      . pantoprazole (PROTONIX) injection 40 mg  40 mg Intravenous Q12H Wynetta Fines T, MD      . Derrill Memo ON 12/11/2020] polyethylene glycol-electrolytes (NuLYTELY) solution 4,000 mL  4,000 mL Oral Once Rogene Houston, MD        Allergies as of 12/09/2020 - Review Complete 12/09/2020  Allergen Reaction Noted  . Atenolol Other (See Comments) 09/15/2015    Family History  Problem Relation Age of Onset  . Breast cancer Sister        Deceased in her 82s    Social History   Socioeconomic History  . Marital status: Divorced    Spouse name: Not on file  . Number of children: Not on file  . Years of education: Not on file  . Highest education level: Not on file  Occupational History  . Not on file  Tobacco Use  . Smoking status: Former Smoker    Packs/day: 1.50    Years: 7.00    Pack years: 10.50    Types: Cigarettes  . Smokeless tobacco: Never Used  . Tobacco comment: quit 50 yrs ago  Vaping Use  . Vaping Use: Never used  Substance and Sexual Activity  . Alcohol use: Not Currently  . Drug use: Never  . Sexual activity: Not on file  Other Topics Concern  . Not on file  Social History Narrative  . Not on file   Social Determinants of Health   Financial Resource Strain: Not on file  Food Insecurity: Not on file  Transportation Needs: Not on file  Physical Activity:  Not on file  Stress: Not on file  Social Connections: Not on file  Intimate Partner Violence: Not on file    Review of Systems: See HPI, otherwise normal ROS  Physical Exam: Temp:  [97.8 F (36.6 C)-98.7 F (37.1 C)] 98.7 F (37.1 C) (12/25 0521) Pulse Rate:  [81-96] 86 (12/25  4270) Resp:  [18-25] 18 (12/25 0521) BP: (155-177)/(62-72) 155/68 (12/25 0521) SpO2:  [84 %-100 %] 100 % (12/25 0521) Weight:  [85.1 kg-89.7 kg] 85.1 kg (12/25 0521) Last BM Date: 12/09/20 (pt reported tarry stool)  Patient is alert and in no acute distress. Conjunctiva is pale.  Sclerae nonicteric. Oropharyngeal mucosa is normal Neck without masses thyromegaly or lymphadenopathy. He has large nodular mass in left breast with skin discoloration and small ulcer.  Is no drainage. Cardiac exam with regular rhythm normal S1 and S2.  No murmur or gallop noted. Auscultation lungs reveal vesicular breath sounds bilaterally. Abdomen is full.  Bowel sounds are normal.  On palpation abdomen is soft and nontender with organomegaly or masses. She does not have peripheral edema clubbing or koilonychia.  Lab Results: Recent Labs    12/09/20 1220 12/10/20 0710  WBC 10.4 9.1  HGB 6.2* 7.4*  HCT 20.9* 25.1*  PLT 419* 371   BMET Recent Labs    12/09/20 1220  NA 137  K 3.7  CL 105  CO2 24  GLUCOSE 228*  BUN 19  CREATININE 0.79  CALCIUM 9.3   LFT Recent Labs    12/09/20 1220  PROT 6.8  ALBUMIN 3.5  AST 14*  ALT 12  ALKPHOS 83  BILITOT 0.3   PT/INR Recent Labs    12/09/20 1220  LABPROT 13.2  INR 1.0   Serum iron 10, TIBC 475 and saturation 2%.  Studies/Results: CT Abdomen Pelvis W Contrast  Result Date: 12/09/2020 CLINICAL DATA:  Abdominal pain. Nonlocalized. Low hemoglobin. History of vaginal cancer breast cancer. Hysterectomy and appendectomy EXAM: CT ABDOMEN AND PELVIS WITH CONTRAST TECHNIQUE: Multidetector CT imaging of the abdomen and pelvis was performed using the standard protocol  following bolus administration of intravenous contrast. CONTRAST:  100 mL Omnipaque COMPARISON:  CT abdomen 04/24/2007 FINDINGS: Lower chest: Lung bases are clear. Large mass within the LEFT breast measures 4.6 x 6.2 cm. Hepatobiliary: No focal hepatic lesion. No biliary duct dilatation. Common bile duct is normal. Pancreas: Pancreas is normal. No ductal dilatation. No pancreatic inflammation. Spleen: Normal spleen Adrenals/urinary tract: Adrenal glands normal. There is cortical thinning and scarring in the medial LEFT kidney. No hydronephrosis. Ureters and bladder normal. Stomach/Bowel: Stomach, duodenum small-bowel normal. There multiple diverticula of the ascending, transverse and descending colon without acute inflammation. Rectum normal. No bowel obstruction or inflammation. Vascular/Lymphatic: Abdominal aorta is normal caliber with atherosclerotic calcification. There is no retroperitoneal or periportal lymphadenopathy. No pelvic lymphadenopathy. Reproductive: Post hysterectomy.  Adnexa unremarkable Other: No free fluid. Musculoskeletal: No aggressive osseous lesion. IMPRESSION: 1. No acute findings in the abdomen pelvis. 2. Large 6 cm LEFT breast mass consistent with breast carcinoma. 3. Extensive pancolonic diverticulosis without evidence acute diverticulitis. 4.  Aortic Atherosclerosis (ICD10-I70.0). Electronically Signed   By: Suzy Bouchard M.D.   On: 12/09/2020 15:23    Assessment;  Patient is 74 year old Caucasian who presents with 6 months history of melena and rectal bleeding and profound anemia.  The symptoms have been going on for 6 months.  Patient has intractable hip and shoulder pain and has been taking aspirin 2 tablets every 4 hours since she has not been able to keep her appointment with Dr. Thedore Mins call to get her narcotic prescription.  Iron studies confirm iron deficiency anemia.  She has received 1 unit of PRBCs.  Hemoglobin has only increased from 6.2 to 7.4 g.  She may need another  unit of PRBCs prior to endoscopic evaluation. Differential diagnosis includes peptic  ulcer disease as well as gastric or colonic neoplasm.  Patient is interested in work-up but she has decided she would not agree to surgery if she was found to have colon carcinoma.  Patient has left breast mass with features of carcinoma.  She has declined further work-up.    Chronic pain.  Doctors alcohol has been doing pain management but the issue is that she would not go for a visit every month which I do not believe is that difficult. We will request social service consult to find out if we could do anything to improve quality of her life.  Recommendations;  Social service consult. CBC in a.m.  If hemoglobin remains below 8 g will give another unit of PRBCs prior to endoscopic intervention Diagnostic esophagogastroduodenoscopy and colonoscopy on 12/11/2020 under monitored anesthesia care. Patient advised to refrain from using NSAIDs or aspirin until further notice    LOS: 1 day   Hartman Minahan  12/10/2020, 5:44 PM

## 2020-12-10 NOTE — Progress Notes (Signed)
PROGRESS NOTE   Brittney Carroll  YKD:[id number] DOB: 04-19-1946 DOA: 12/09/2020 PCP: Celene Squibb, MD   Chief Complaint  Patient presents with  . Abnormal Lab    Brief Admission History:  This unfortunate 74 year old female with left breast cancer diverticulosis recurrent rectal bleeding chronic pelvic pain chronic hip pain presented to the emergency department complaining of 2 to 3 days of bright red blood per rectum and associated with weakness and abnormal labs from PCP reported as hemoglobin less than 7.  Unfortunately she has been treating her chronic hip pain with aspirin every 4 hours because she did not have transportation to continue to see her pain management specialist and has no longer been taking opioids for pain relief.  She had not sought diagnosis or treatment of the left breast mass and at this time has decided not to pursue further treatment.  Assessment & Plan:   Active Problems:   GI bleed   1. Recurrent GI bleeding-treating supportively for now.  GI consultation requested.  Transfuse as needed.  Patient is adamant she does not want any intra-abdominal surgeries. 2. Left breast cancer-I asked for a surgery consult to see if patient would be willing to have a biopsy done while she is in the hospital unfortunately she has declined any further work-up or treatment.  Therefore I have asked for palliative medicine consultation. 3. Chronic hip pain-patient is now at end-of-life and I have asked for a palliative consult for symptom management.  I am restarting opioid therapy for pain management. 4. Essential hypertension-monitor blood pressure and treating as needed.   DVT prophylaxis: SCD Code Status:  Family Communication: Son present at bedside Disposition:   Status is: Inpatient  Remains inpatient appropriate because:Inpatient level of care appropriate due to severity of illness   Dispo: The patient is from: Home              Anticipated d/c is to: Home               Anticipated d/c date is: 1 day              Patient currently is not medically stable to d/c.         Consultants:   GI  Surgery  Palliative medicine  Procedures:     Antimicrobials:     Subjective: Patient reports severe hip pain and requesting pain management  Objective: Vitals:   12/09/20 1904 12/09/20 2017 12/09/20 2122 12/10/20 0521  BP: (!) 169/63  (!) 172/72 (!) 155/68  Pulse: 81  81 86  Resp: 20  18 18   Temp: 97.9 F (36.6 C)  97.8 F (36.6 C) 98.7 F (37.1 C)  TempSrc: Oral  Oral Oral  SpO2: 95% (!) 84% 100% 100%  Weight: 89.7 kg   85.1 kg  Height: 5\' 6"  (1.676 m)       Intake/Output Summary (Last 24 hours) at 12/10/2020 1155 Last data filed at 12/10/2020 1000 Gross per 24 hour  Intake 315 ml  Output 850 ml  Net -535 ml   Filed Weights   12/09/20 1121 12/09/20 1904 12/10/20 0521  Weight: 86.2 kg 89.7 kg 85.1 kg    Examination:  General exam: Appears calm and comfortable. Appears pale.  Respiratory system: Clear to auscultation. Respiratory effort normal. Cardiovascular system: normal S1 & S2 heard. No JVD, murmurs, rubs, gallops or clicks. No pedal edema. Gastrointestinal system: Abdomen is nondistended, soft and nontender. No organomegaly or masses felt. Normal bowel sounds heard. Central  nervous system: Alert and oriented. No focal neurological deficits. Extremities: Symmetric 5 x 5 power. Skin: No rashes, lesions or ulcers Psychiatry: Judgement and insight appear normal. Mood & affect appropriate.   Data Reviewed: I have personally reviewed following labs and imaging studies  CBC: Recent Labs  Lab 12/09/20 1220 12/10/20 0710  WBC 10.4 9.1  NEUTROABS 6.4  --   HGB 6.2* 7.4*  HCT 20.9* 25.1*  MCV 76.8* 78.9*  PLT 419* 341    Basic Metabolic Panel: Recent Labs  Lab 12/09/20 1220  NA 137  K 3.7  CL 105  CO2 24  GLUCOSE 228*  BUN 19  CREATININE 0.79  CALCIUM 9.3    GFR: Estimated Creatinine Clearance: 67.8 mL/min  (by C-G formula based on SCr of 0.79 mg/dL).  Liver Function Tests: Recent Labs  Lab 12/09/20 1220  AST 14*  ALT 12  ALKPHOS 83  BILITOT 0.3  PROT 6.8  ALBUMIN 3.5    CBG: No results for input(s): GLUCAP in the last 168 hours.  Recent Results (from the past 240 hour(s))  Resp Panel by RT-PCR (Flu A&B, Covid) Nasopharyngeal Swab     Status: None   Collection Time: 12/09/20 12:53 PM   Specimen: Nasopharyngeal Swab; Nasopharyngeal(NP) swabs in vial transport medium  Result Value Ref Range Status   SARS Coronavirus 2 by RT PCR NEGATIVE NEGATIVE Final    Comment: (NOTE) SARS-CoV-2 target nucleic acids are NOT DETECTED.  The SARS-CoV-2 RNA is generally detectable in upper respiratory specimens during the acute phase of infection. The lowest concentration of SARS-CoV-2 viral copies this assay can detect is 138 copies/mL. A negative result does not preclude SARS-Cov-2 infection and should not be used as the sole basis for treatment or other patient management decisions. A negative result may occur with  improper specimen collection/handling, submission of specimen other than nasopharyngeal swab, presence of viral mutation(s) within the areas targeted by this assay, and inadequate number of viral copies(<138 copies/mL). A negative result must be combined with clinical observations, patient history, and epidemiological information. The expected result is Negative.  Fact Sheet for Patients:  EntrepreneurPulse.com.au  Fact Sheet for Healthcare Providers:  IncredibleEmployment.be  This test is no t yet approved or cleared by the [place] FDA and  has been authorized for detection and/or diagnosis of SARS-CoV-2 by FDA under an Emergency Use Authorization (EUA). This EUA will remain  in effect (meaning this test can be used) for the duration of the COVID-19 declaration under Section 564(b)(1) of the Act, 21 U.S.C.section 360bbb-3(b)(1),  unless the authorization is terminated  or revoked sooner.       Influenza A by PCR NEGATIVE NEGATIVE Final   Influenza B by PCR NEGATIVE NEGATIVE Final    Comment: (NOTE) The Xpert Xpress SARS-CoV-2/FLU/RSV plus assay is intended as an aid in the diagnosis of influenza from Nasopharyngeal swab specimens and should not be used as a sole basis for treatment. Nasal washings and aspirates are unacceptable for Xpert Xpress SARS-CoV-2/FLU/RSV testing.  Fact Sheet for Patients: EntrepreneurPulse.com.au  Fact Sheet for Healthcare Providers: IncredibleEmployment.be  This test is not yet approved or cleared by the [place] FDA and has been authorized for detection and/or diagnosis of SARS-CoV-2 by FDA under an Emergency Use Authorization (EUA). This EUA will remain in effect (meaning this test can be used) for the duration of the COVID-19 declaration under Section 564(b)(1) of the Act, 21 U.S.C. section 360bbb-3(b)(1), unless the authorization is terminated or revoked.  Performed at [facility]  [facility], [address] [place], [place] [address]      Radiology Studies: CT Abdomen Pelvis W Contrast  Result Date: 12/09/2020 CLINICAL DATA:  Abdominal pain. Nonlocalized. Low hemoglobin. History of vaginal cancer breast cancer. Hysterectomy and appendectomy EXAM: CT ABDOMEN AND PELVIS WITH CONTRAST TECHNIQUE: Multidetector CT imaging of the abdomen and pelvis was performed using the standard protocol following bolus administration of intravenous contrast. CONTRAST:  100 mL Omnipaque COMPARISON:  CT abdomen 04/24/2007 FINDINGS: Lower chest: Lung bases are clear. Large mass within the LEFT breast measures 4.6 x 6.2 cm. Hepatobiliary: No focal hepatic lesion. No biliary duct dilatation. Common bile duct is normal. Pancreas: Pancreas is normal. No ductal dilatation. No pancreatic inflammation. Spleen: Normal spleen Adrenals/urinary tract: Adrenal glands normal. There  is cortical thinning and scarring in the medial LEFT kidney. No hydronephrosis. Ureters and bladder normal. Stomach/Bowel: Stomach, duodenum small-bowel normal. There multiple diverticula of the ascending, transverse and descending colon without acute inflammation. Rectum normal. No bowel obstruction or inflammation. Vascular/Lymphatic: Abdominal aorta is normal caliber with atherosclerotic calcification. There is no retroperitoneal or periportal lymphadenopathy. No pelvic lymphadenopathy. Reproductive: Post hysterectomy.  Adnexa unremarkable Other: No free fluid. Musculoskeletal: No aggressive osseous lesion. IMPRESSION: 1. No acute findings in the abdomen pelvis. 2. Large 6 cm LEFT breast mass consistent with breast carcinoma. 3. Extensive pancolonic diverticulosis without evidence acute diverticulitis. 4.  Aortic Atherosclerosis (ICD10-I70.0). Electronically Signed   By: Suzy Bouchard M.D.   On: 12/09/2020 15:23   Scheduled Meds: . [START ON 12/11/2020] COVID-19 mRNA vaccine (Moderna)  0.5 mL Intramuscular ONCE-1600  . pantoprazole (PROTONIX) IV  40 mg Intravenous Q12H   Continuous Infusions:   LOS: 1 day   Time spent: 35 minutes    Reginia Battie Wynetta Emery, MD How to contact the [facility] Attending or Consulting provider [place] or covering provider during after hours [place], for this patient?  1. Check the care team in [facility] and look for a) attending/consulting [facility] provider listed and b) the [facility] team listed 2. Log into [email or web address] and use [place]'s universal password to access. If you do not have the password, please contact the hospital operator. 3. Locate the [facility] provider you are looking for under [facility] and page to a number that you can be directly reached. 4. If you still have difficulty reaching the provider, please page the [facility] (Director on Call) for the Hospitalists listed on amion for assistance.  12/10/2020, 11:55 AM

## 2020-12-10 NOTE — Consult Note (Signed)
Reason for Consult: Breast mass Referring Physician: Wynetta Emery, hospital medicine  Brittney Carroll is an 74 y.o. female.  HPI: She was admitted to the hospital last night due to GI bleeding.  She has a history of multiple and repeat GI bleeds thought to be secondary to diverticulosis.  She apparently has had multiple issues since summer of this year, with episodes lasting 2 to 3 days, with spontaneous resolution.  She says it recurs about every 2 to 4 weeks, but she has not sought any medical advice until she saw her primary care provider last week.  On presentation to the emergency department, her hemoglobin was less than 7.  While she was being evaluated for admission, she reported a nodule in her left breast that apparently has been growing over about the last year.  She has not sought any treatment.  General surgery has been consulted in this context for further evaluation and management.  Brittney Carroll states that she had onset of menarche around age 46.  She had 3 children and breast-fed all of them.  She does not recall the age at which she went through menopause, but she did take hormone replacement therapy for about 6 months.  She had a total abdominal hysterectomy for malignancy, but she does not recall whether it was endometrial or ovarian.  She says that her last mammogram was probably 20 years ago.  She does not perform regular breast self exams.  She says that her sister died of breast cancer in her 74s; no other family members with breast or ovarian/uterine cancer.  She reports that the first symptom of this mass was bloody discharge from her left nipple.  It has since stopped, but the mass has continued to grow and is tender.  She says that she has significant problems with transportation and did not seek any treatment as a result.  Past Medical History:  Diagnosis Date  . Arthritis   . Breast mass, left   . Cancer ([place])    vaginal 2008  . Diabetes mellitus without complication ([place])   .  Hyperlipidemia   . Hypertension     Past Surgical History:  Procedure Laterality Date  . ABDOMINAL HYSTERECTOMY    . APPENDECTOMY    . TONSILLECTOMY      Family History  Problem Relation Age of Onset  . Breast cancer Sister        Deceased in her 20s    Social History:  reports that she has quit smoking. Her smoking use included cigarettes. She has a 10.50 pack-year smoking history. She has never used smokeless tobacco. She reports previous alcohol use. She reports that she does not use drugs.  Allergies:  Allergies  Allergen Reactions  . Atenolol Other (See Comments)    Makes BP drop suddenly and feels lifeless    Medications: I have reviewed the patient's current medications.  Results for orders placed or performed during the hospital encounter of 12/09/20 (from the past 48 hour(s))  Reticulocytes     Status: Abnormal   Collection Time: 12/09/20 12:05 PM  Result Value Ref Range   Retic Ct Pct 2.7 0.4 - 3.1 %   RBC. 2.80 (L) 3.87 - 5.11 MIL/uL   Retic Count, Absolute 75.0 19.0 - 186.0 K/uL   Immature Retic Fract 39.5 (H) 2.3 - 15.9 %    Comment: Performed at [facility], [address] [place],  [address]  Comprehensive metabolic panel     Status: Abnormal   Collection  Time: 12/09/20 12:20 PM  Result Value Ref Range   Sodium 137 135 - 145 mmol/L   Potassium 3.7 3.5 - 5.1 mmol/L   Chloride 105 98 - 111 mmol/L   CO2 24 22 - 32 mmol/L   Glucose, Bld 228 (H) 70 - 99 mg/dL    Comment: Glucose reference range applies only to samples taken after fasting for at least 8 hours.   BUN 19 8 - 23 mg/dL   Creatinine, Ser 0.79 0.44 - 1.00 mg/dL   Calcium 9.3 8.9 - 10.3 mg/dL   Total Protein 6.8 6.5 - 8.1 g/dL   Albumin 3.5 3.5 - 5.0 g/dL   AST 14 (L) 15 - 41 U/L   ALT 12 0 - 44 U/L   Alkaline Phosphatase 83 38 - 126 U/L   Total Bilirubin 0.3 0.3 - 1.2 mg/dL   GFR, Estimated >60 >60 mL/min    Comment: (NOTE) Calculated using the CKD-EPI Creatinine Equation (2021)     Anion gap 8 5 - 15    Comment: Performed at [facility], [address] [place], [place] [address]  CBC WITH DIFFERENTIAL     Status: Abnormal   Collection Time: 12/09/20 12:20 PM  Result Value Ref Range   WBC 10.4 4.0 - 10.5 K/uL   RBC 2.72 (L) 3.87 - 5.11 MIL/uL   Hemoglobin 6.2 (LL) 12.0 - 15.0 g/dL    Comment: Reticulocyte Hemoglobin testing may be clinically indicated, consider ordering this additional test [id number] THIS CRITICAL RESULT HAS VERIFIED AND BEEN CALLED TO GENTRY,R BY BOBBIE MATTHEWS ON 12 24 2021 AT 1316, AND HAS BEEN READ BACK.     HCT 20.9 (L) 36.0 - 46.0 %   MCV 76.8 (L) 80.0 - 100.0 fL   MCH 22.8 (L) 26.0 - 34.0 pg   MCHC 29.7 (L) 30.0 - 36.0 g/dL   RDW 15.1 11.5 - 15.5 %   Platelets 419 (H) 150 - 400 K/uL   nRBC 0.0 0.0 - 0.2 %   Neutrophils Relative % 61 %   Neutro Abs 6.4 1.7 - 7.7 K/uL   Lymphocytes Relative 26 %   Lymphs Abs 2.7 0.7 - 4.0 K/uL   Monocytes Relative 10 %   Monocytes Absolute 1.0 0.1 - 1.0 K/uL   Eosinophils Relative 2 %   Eosinophils Absolute 0.3 0.0 - 0.5 K/uL   Basophils Relative 1 %   Basophils Absolute 0.1 0.0 - 0.1 K/uL   Immature Granulocytes 0 %   Abs Immature Granulocytes 0.04 0.00 - 0.07 K/uL    Comment: Performed at [facility], [address], [place], [place] [address]  Protime-INR     Status: None   Collection Time: 12/09/20 12:20 PM  Result Value Ref Range   Prothrombin Time 13.2 11.4 - 15.2 seconds   INR 1.0 0.8 - 1.2    Comment: (NOTE) INR goal varies based on device and disease states. Performed at [facility], [address] [place], [place] [address]   Type and screen [facility]     Status: None (Preliminary result)   Collection Time: 12/09/20 12:20 PM  Result Value Ref Range   ABO/RH(D) B POS    Antibody Screen NEG    Sample Expiration 12/12/2020,2359    Unit Number [id number]    Blood Component Type RED CELLS,LR    Unit division 00    Status of Unit ALLOCATED    Transfusion Status OK  TO TRANSFUSE    Crossmatch Result Compatible  Unit Number [id number]    Blood Component Type RED CELLS,LR    Unit division 00    Status of Unit ISSUED,FINAL    Transfusion Status OK TO TRANSFUSE    Crossmatch Result      Compatible Performed at [facility], [address], [place], [place] [address]   Lactic acid, plasma     Status: Abnormal   Collection Time: 12/09/20 12:20 PM  Result Value Ref Range   Lactic Acid, Venous 2.2 (HH) 0.5 - 1.9 mmol/L    Comment: CRITICAL RESULT CALLED TO, READ BACK BY AND VERIFIED WITH: GENTRY,R AT 1310 BY HUFFINES,S ON 12.24.21 Performed at [facility], [address], [place], [place] [address]   Hemoglobin A1c     Status: Abnormal   Collection Time: 12/09/20 12:20 PM  Result Value Ref Range   Hgb A1c MFr Bld 8.6 (H) 4.8 - 5.6 %    Comment: (NOTE) Pre diabetes:          5.7%-6.4%  Diabetes:              >6.4%  Glycemic control for   <7.0% adults with diabetes    Mean Plasma Glucose 200.12 mg/dL    Comment: Performed at [facility] [address] [place], [place] [address]  Resp Panel by RT-PCR (Flu A&B, Covid) Nasopharyngeal Swab     Status: None   Collection Time: 12/09/20 12:53 PM   Specimen: Nasopharyngeal Swab; Nasopharyngeal(NP) swabs in vial transport medium  Result Value Ref Range   SARS Coronavirus 2 by RT PCR NEGATIVE NEGATIVE    Comment: (NOTE) SARS-CoV-2 target nucleic acids are NOT DETECTED.  The SARS-CoV-2 RNA is generally detectable in upper respiratory specimens during the acute phase of infection. The lowest concentration of SARS-CoV-2 viral copies this assay can detect is 138 copies/mL. A negative result does not preclude SARS-Cov-2 infection and should not be used as the sole basis for treatment or other patient management decisions. A negative result may occur with  improper specimen collection/handling, submission of specimen other than nasopharyngeal swab, presence of viral mutation(s) within  the areas targeted by this assay, and inadequate number of viral copies(<138 copies/mL). A negative result must be combined with clinical observations, patient history, and epidemiological information. The expected result is Negative.  Fact Sheet for Patients:  EntrepreneurPulse.com.au  Fact Sheet for Healthcare Providers:  IncredibleEmployment.be  This test is no t yet approved or cleared by the [place] FDA and  has been authorized for detection and/or diagnosis of SARS-CoV-2 by FDA under an Emergency Use Authorization (EUA). This EUA will remain  in effect (meaning this test can be used) for the duration of the COVID-19 declaration under Section 564(b)(1) of the Act, 21 U.S.C.section 360bbb-3(b)(1), unless the authorization is terminated  or revoked sooner.       Influenza A by PCR NEGATIVE NEGATIVE   Influenza B by PCR NEGATIVE NEGATIVE    Comment: (NOTE) The Xpert Xpress SARS-CoV-2/FLU/RSV plus assay is intended as an aid in the diagnosis of influenza from Nasopharyngeal swab specimens and should not be used as a sole basis for treatment. Nasal washings and aspirates are unacceptable for Xpert Xpress SARS-CoV-2/FLU/RSV testing.  Fact Sheet for Patients: EntrepreneurPulse.com.au  Fact Sheet for Healthcare Providers: IncredibleEmployment.be  This test is not yet approved or cleared by the [place] FDA and has been authorized for detection and/or diagnosis of SARS-CoV-2 by FDA under an Emergency Use Authorization (EUA). This EUA will remain in effect (meaning this  test can be used) for the duration of the COVID-19 declaration under Section 564(b)(1) of the Act, 21 U.S.C. section 360bbb-3(b)(1), unless the authorization is terminated or revoked.  Performed at [facility], [address], [place], [place] [address]   Prepare RBC (crossmatch)     Status: None   Collection Time: 12/09/20  12:53 PM  Result Value Ref Range   Order Confirmation      ORDER PROCESSED BY BLOOD BANK Performed at [facility], [address], [place], [place] [address]   Urinalysis, Routine w reflex microscopic     Status: Abnormal   Collection Time: 12/09/20 12:56 PM  Result Value Ref Range   Color, Urine COLORLESS (A) YELLOW   APPearance CLEAR CLEAR   Specific Gravity, Urine 1.004 (L) 1.005 - 1.030   pH 5.0 5.0 - 8.0   Glucose, UA NEGATIVE NEGATIVE mg/dL   Hgb urine dipstick NEGATIVE NEGATIVE   Bilirubin Urine NEGATIVE NEGATIVE   Ketones, ur NEGATIVE NEGATIVE mg/dL   Protein, ur NEGATIVE NEGATIVE mg/dL   Nitrite NEGATIVE NEGATIVE   Leukocytes,Ua NEGATIVE NEGATIVE    Comment: Performed at [facility], [address], [place], [place] [address]  ABO/Rh     Status: None   Collection Time: 12/09/20  1:57 PM  Result Value Ref Range   ABO/RH(D)      B POS Performed at [facility], [address], [place], [place] [address]   Iron and TIBC     Status: Abnormal   Collection Time: 12/09/20  1:57 PM  Result Value Ref Range   Iron 10 (L) 28 - 170 ug/dL   TIBC 475 (H) 250 - 450 ug/dL   Saturation Ratios 2 (L) 10.4 - 31.8 %   UIBC 465 ug/dL    Comment: Performed at [facility], [address], [place], [place] [address]  POC occult blood, ED     Status: Abnormal   Collection Time: 12/09/20  3:20 PM  Result Value Ref Range   Fecal Occult Bld POSITIVE (A) NEGATIVE  CBC     Status: Abnormal   Collection Time: 12/10/20  7:10 AM  Result Value Ref Range   WBC 9.1 4.0 - 10.5 K/uL   RBC 3.18 (L) 3.87 - 5.11 MIL/uL   Hemoglobin 7.4 (L) 12.0 - 15.0 g/dL   HCT 25.1 (L) 36.0 - 46.0 %   MCV 78.9 (L) 80.0 - 100.0 fL   MCH 23.3 (L) 26.0 - 34.0 pg   MCHC 29.5 (L) 30.0 - 36.0 g/dL   RDW 15.6 (H) 11.5 - 15.5 %   Platelets 371 150 - 400 K/uL   nRBC 0.0 0.0 - 0.2 %    Comment: Performed at [facility], [address], [place], [place] [address]    CT Abdomen Pelvis W Contrast  Result Date:  12/09/2020 CLINICAL DATA:  Abdominal pain. Nonlocalized. Low hemoglobin. History of vaginal cancer breast cancer. Hysterectomy and appendectomy EXAM: CT ABDOMEN AND PELVIS WITH CONTRAST TECHNIQUE: Multidetector CT imaging of the abdomen and pelvis was performed using the standard protocol following bolus administration of intravenous contrast. CONTRAST:  100 mL Omnipaque COMPARISON:  CT abdomen 04/24/2007 FINDINGS: Lower chest: Lung bases are clear. Large mass within the LEFT breast measures 4.6 x 6.2 cm. Hepatobiliary: No focal hepatic lesion. No biliary duct dilatation. Common bile duct is normal. Pancreas: Pancreas is normal. No ductal dilatation. No pancreatic inflammation. Spleen: Normal spleen Adrenals/urinary tract: Adrenal glands normal. There is cortical thinning and scarring in the medial LEFT kidney.  No hydronephrosis. Ureters and bladder normal. Stomach/Bowel: Stomach, duodenum small-bowel normal. There multiple diverticula of the ascending, transverse and descending colon without acute inflammation. Rectum normal. No bowel obstruction or inflammation. Vascular/Lymphatic: Abdominal aorta is normal caliber with atherosclerotic calcification. There is no retroperitoneal or periportal lymphadenopathy. No pelvic lymphadenopathy. Reproductive: Post hysterectomy.  Adnexa unremarkable Other: No free fluid. Musculoskeletal: No aggressive osseous lesion. IMPRESSION: 1. No acute findings in the abdomen pelvis. 2. Large 6 cm LEFT breast mass consistent with breast carcinoma. 3. Extensive pancolonic diverticulosis without evidence acute diverticulitis. 4.  Aortic Atherosclerosis (ICD10-I70.0). Electronically Signed   By: Suzy Bouchard M.D.   On: 12/09/2020 15:23    Review of Systems  Gastrointestinal: Positive for blood in stool.  Musculoskeletal: Positive for arthralgias, back pain, joint swelling and myalgias.  All other systems reviewed and are negative.  Blood pressure (!) 155/68, pulse 86,  temperature 98.7 F (37.1 C), temperature source Oral, resp. rate 18, height 5\' 6"  (1.676 m), weight 85.1 kg, SpO2 100 %. Physical Exam Vitals reviewed. Exam conducted with a chaperone present.  Constitutional:      General: She is not in acute distress.    Appearance: Normal appearance. She is obese.  HENT:     Head: Normocephalic and atraumatic.     Nose: Nose normal.     Mouth/Throat:     Mouth: Mucous membranes are moist.  Eyes:     General: No scleral icterus.       Right eye: No discharge.        Left eye: No discharge.     Conjunctiva/sclera: Conjunctivae normal.  Cardiovascular:     Rate and Rhythm: Normal rate and regular rhythm.  Pulmonary:     Effort: Pulmonary effort is normal. No respiratory distress.  Chest:  Breasts:     Right: Normal.     Left: Mass and skin change present.        Comments: There is a fungating mass in the left breast with overlying skin changes and ulceration.  The mass itself occupies greater than 50% of the total breast volume.  No discrete supraclavicular or cervical lymphadenopathy, but I felt that there could be some matted axillary nodes on the left. Abdominal:     General: There is distension.     Palpations: Abdomen is soft.  Genitourinary:    Comments: Deferred Musculoskeletal:        General: Tenderness present.     Cervical back: Normal range of motion and neck supple.  Skin:    General: Skin is warm and dry.     Coloration: Skin is pale.  Neurological:     General: No focal deficit present.     Mental Status: She is alert and oriented to person, place, and time.  Psychiatric:        Behavior: Behavior normal.     Assessment/Plan: This is a 74 year old woman admitted for GI bleeding, but also found to have a large fungating breast mass.  This almost certainly represents a high stage breast cancer, clinically appears to be T4 N1 Mx.  Ms. Smyre states that she is not interested in any intervention, including neoadjuvant  chemotherapy or surgery.  She is worried about the burden this would present to her family and states that she has very limited social support.  I suggested to her that she may wish to have a conversation with an oncology breast navigator to discuss what her options might be and to help find resources to manage  her disease process, regardless of what treatment options, or lack thereof she wishes to pursue.  She would likely also benefit from counseling with a Education officer, museum and/or psychologist/psychiatrist.  At a minimum, palliative care should be engaged.  I discussed these recommendations with Dr. Wynetta Emery.  As there does not appear to be any role for surgery at this time, we will sign off.  Fredirick Maudlin 12/10/2020, 10:37 AM

## 2020-12-11 DIAGNOSIS — C50412 Malignant neoplasm of upper-outer quadrant of left female breast: Secondary | ICD-10-CM | POA: Diagnosis not present

## 2020-12-11 DIAGNOSIS — K5791 Diverticulosis of intestine, part unspecified, without perforation or abscess with bleeding: Secondary | ICD-10-CM | POA: Diagnosis not present

## 2020-12-11 DIAGNOSIS — D509 Iron deficiency anemia, unspecified: Secondary | ICD-10-CM | POA: Diagnosis not present

## 2020-12-11 DIAGNOSIS — K922 Gastrointestinal hemorrhage, unspecified: Secondary | ICD-10-CM | POA: Diagnosis not present

## 2020-12-11 LAB — CBC
HCT: 26 % — ABNORMAL LOW (ref 36.0–46.0)
Hemoglobin: 7.7 g/dL — ABNORMAL LOW (ref 12.0–15.0)
MCH: 23.5 pg — ABNORMAL LOW (ref 26.0–34.0)
MCHC: 29.6 g/dL — ABNORMAL LOW (ref 30.0–36.0)
MCV: 79.3 fL — ABNORMAL LOW (ref 80.0–100.0)
Platelets: 366 10*3/uL (ref 150–400)
RBC: 3.28 MIL/uL — ABNORMAL LOW (ref 3.87–5.11)
RDW: 15.5 % (ref 11.5–15.5)
WBC: 12.2 10*3/uL — ABNORMAL HIGH (ref 4.0–10.5)
nRBC: 0 % (ref 0.0–0.2)

## 2020-12-11 MED ORDER — SODIUM CHLORIDE 0.9% IV SOLUTION: Freq: Once | INTRAVENOUS | Status: AC

## 2020-12-11 MED ORDER — SODIUM CHLORIDE 0.9 % IV SOLN: INTRAVENOUS | Status: DC

## 2020-12-11 MED ORDER — LISINOPRIL 10 MG PO TABS
10.0000 mg | ORAL_TABLET | Freq: Every day | ORAL | Status: DC
  Administered 2020-12-11 – 2020-12-14 (×3): 10 mg via ORAL
  Filled 2020-12-11 (×4): qty 1

## 2020-12-11 MED ORDER — ONDANSETRON HCL 4 MG/2ML IJ SOLN
4.0000 mg | Freq: Four times a day (QID) | INTRAMUSCULAR | Status: DC | PRN
  Administered 2020-12-11 (×2): 4 mg via INTRAVENOUS
  Filled 2020-12-11 (×2): qty 2

## 2020-12-11 MED ORDER — SODIUM CHLORIDE 0.9% IV SOLUTION: Freq: Once | INTRAVENOUS | Status: DC

## 2020-12-11 NOTE — Plan of Care (Signed)
  Problem: Acute Rehab PT Goals(only PT should resolve) Goal: Pt Will Go Supine/Side To Sit Outcome: Progressing Flowsheets (Taken 12/11/2020 1335) Pt will go Supine/Side to Sit: with min guard assist Goal: Patient Will Perform Sitting Balance Outcome: Progressing Flowsheets (Taken 12/11/2020 1335) Patient will perform sitting balance:  with min guard assist  with minimal assist Goal: Patient Will Transfer Sit To/From Stand Outcome: Progressing Flowsheets (Taken 12/11/2020 1335) Patient will transfer sit to/from stand:  with min guard assist  with minimal assist Goal: Pt Will Ambulate Outcome: Progressing Flowsheets (Taken 12/11/2020 1335) Pt will Ambulate:  75 feet  with min guard assist  with minimal assist  with rolling walker   1:36 PM, 12/11/20 Lonell Grandchild, MPT Physical Therapist with [facility] [phone] office [phone] mobile phone

## 2020-12-11 NOTE — Progress Notes (Signed)
PROGRESS NOTE   Brittney Carroll  YIF:[id number] DOB: 1946-05-22 DOA: 12/09/2020 PCP: Celene Squibb, MD   Chief Complaint  Patient presents with  . Abnormal Lab    Brief Admission History:  This unfortunate 74 year old female with left breast cancer diverticulosis recurrent rectal bleeding chronic pelvic pain chronic hip pain presented to the emergency department complaining of 2 to 3 days of bright red blood per rectum and associated with weakness and abnormal labs from PCP reported as hemoglobin less than 7.  Unfortunately she has been treating her chronic hip pain with aspirin every 4 hours because she did not have transportation to continue to see her pain management specialist and has no longer been taking opioids for pain relief.  She had not sought diagnosis or treatment of the left breast mass and at this time has decided not to pursue further treatment.  Assessment & Plan:   Active Problems:   GI bleed   1. Recurrent GI bleeding-treating supportively for now.  GI consultation requested and planning EGD/colon 12/27.  Transfuse as needed.  Patient is adamant she does not want any intra-abdominal surgeries. 2. Left breast cancer-I asked for a surgery consult to see if patient would be willing to have a biopsy done while she is in the hospital unfortunately she has declined any further work-up or treatment.  Therefore I have asked for palliative medicine consultation. 3. Chronic hip pain-patient is now at end-of-life and I have asked for a palliative consult for symptom management.  I am restarting opioid therapy for pain management. 4. Essential hypertension-monitor blood pressure and treating with home lisinopril.   DVT prophylaxis: SCD Code Status:  Family Communication: Son present at bedside 12/25 Disposition: TBD  Status is: Inpatient  Remains inpatient appropriate because:Inpatient level of care appropriate due to severity of illness  Dispo: The patient is from: Home               Anticipated d/c is to: Home              Anticipated d/c date is: 1 day              Patient currently is not medically stable to d/c.  Consultants:   GI  Surgery  Palliative medicine  Procedures:     Antimicrobials:     Subjective: Patient reports severe hip pain and says pain medication seems to be working.   Objective: Vitals:   12/10/20 0521 12/10/20 2227 12/11/20 0004 12/11/20 0440  BP: (!) 155/68  (!) 135/59 (!) 166/69  Pulse: 86 (!) 109 (!) 109 97  Resp: 18 20 18 18   Temp: 98.7 F (37.1 C)  98.2 F (36.8 C) 97.6 F (36.4 C)  TempSrc: Oral     SpO2: 100% 93% 92% 91%  Weight: 85.1 kg     Height:        Intake/Output Summary (Last 24 hours) at 12/11/2020 1040 Last data filed at 12/11/2020 0011 Gross per 24 hour  Intake 120 ml  Output 600 ml  Net -480 ml   Filed Weights   12/09/20 1121 12/09/20 1904 12/10/20 0521  Weight: 86.2 kg 89.7 kg 85.1 kg    Examination:  General exam: Appears calm and comfortable. Appears pale.  Respiratory system: Clear to auscultation. Respiratory effort normal. Cardiovascular system: normal S1 & S2 heard. No JVD, murmurs, rubs, gallops or clicks. No pedal edema. Gastrointestinal system: Abdomen is nondistended, soft and nontender. No organomegaly or masses felt. Normal bowel sounds heard. Central  nervous system: Alert and oriented. No focal neurological deficits. Extremities: severe bilateral hip pain and discomfort, Symmetric 5 x 5 power. Skin: No rashes, lesions or ulcers.  Psychiatry: Mood & affect appropriate.   Data Reviewed: I have personally reviewed following labs and imaging studies  CBC: Recent Labs  Lab 12/09/20 1220 12/10/20 0710  WBC 10.4 9.1  NEUTROABS 6.4  --   HGB 6.2* 7.4*  HCT 20.9* 25.1*  MCV 76.8* 78.9*  PLT 419* 694    Basic Metabolic Panel: Recent Labs  Lab 12/09/20 1220  NA 137  K 3.7  CL 105  CO2 24  GLUCOSE 228*  BUN 19  CREATININE 0.79  CALCIUM 9.3     GFR: Estimated Creatinine Clearance: 67.8 mL/min (by C-G formula based on SCr of 0.79 mg/dL).  Liver Function Tests: Recent Labs  Lab 12/09/20 1220  AST 14*  ALT 12  ALKPHOS 83  BILITOT 0.3  PROT 6.8  ALBUMIN 3.5    CBG: No results for input(s): GLUCAP in the last 168 hours.  Recent Results (from the past 240 hour(s))  Resp Panel by RT-PCR (Flu A&B, Covid) Nasopharyngeal Swab     Status: None   Collection Time: 12/09/20 12:53 PM   Specimen: Nasopharyngeal Swab; Nasopharyngeal(NP) swabs in vial transport medium  Result Value Ref Range Status   SARS Coronavirus 2 by RT PCR NEGATIVE NEGATIVE Final    Comment: (NOTE) SARS-CoV-2 target nucleic acids are NOT DETECTED.  The SARS-CoV-2 RNA is generally detectable in upper respiratory specimens during the acute phase of infection. The lowest concentration of SARS-CoV-2 viral copies this assay can detect is 138 copies/mL. A negative result does not preclude SARS-Cov-2 infection and should not be used as the sole basis for treatment or other patient management decisions. A negative result may occur with  improper specimen collection/handling, submission of specimen other than nasopharyngeal swab, presence of viral mutation(s) within the areas targeted by this assay, and inadequate number of viral copies(<138 copies/mL). A negative result must be combined with clinical observations, patient history, and epidemiological information. The expected result is Negative.  Fact Sheet for Patients:  EntrepreneurPulse.com.au  Fact Sheet for Healthcare Providers:  IncredibleEmployment.be  This test is no t yet approved or cleared by the [place] FDA and  has been authorized for detection and/or diagnosis of SARS-CoV-2 by FDA under an Emergency Use Authorization (EUA). This EUA will remain  in effect (meaning this test can be used) for the duration of the COVID-19 declaration under Section  564(b)(1) of the Act, 21 U.S.C.section 360bbb-3(b)(1), unless the authorization is terminated  or revoked sooner.       Influenza A by PCR NEGATIVE NEGATIVE Final   Influenza B by PCR NEGATIVE NEGATIVE Final    Comment: (NOTE) The Xpert Xpress SARS-CoV-2/FLU/RSV plus assay is intended as an aid in the diagnosis of influenza from Nasopharyngeal swab specimens and should not be used as a sole basis for treatment. Nasal washings and aspirates are unacceptable for Xpert Xpress SARS-CoV-2/FLU/RSV testing.  Fact Sheet for Patients: EntrepreneurPulse.com.au  Fact Sheet for Healthcare Providers: IncredibleEmployment.be  This test is not yet approved or cleared by the [place] FDA and has been authorized for detection and/or diagnosis of SARS-CoV-2 by FDA under an Emergency Use Authorization (EUA). This EUA will remain in effect (meaning this test can be used) for the duration of the COVID-19 declaration under Section 564(b)(1) of the Act, 21 U.S.C. section 360bbb-3(b)(1), unless the authorization is terminated or revoked.  Performed at  [facility], [address] [place], [place] [address]      Radiology Studies: CT Abdomen Pelvis W Contrast  Result Date: 12/09/2020 CLINICAL DATA:  Abdominal pain. Nonlocalized. Low hemoglobin. History of vaginal cancer breast cancer. Hysterectomy and appendectomy EXAM: CT ABDOMEN AND PELVIS WITH CONTRAST TECHNIQUE: Multidetector CT imaging of the abdomen and pelvis was performed using the standard protocol following bolus administration of intravenous contrast. CONTRAST:  100 mL Omnipaque COMPARISON:  CT abdomen 04/24/2007 FINDINGS: Lower chest: Lung bases are clear. Large mass within the LEFT breast measures 4.6 x 6.2 cm. Hepatobiliary: No focal hepatic lesion. No biliary duct dilatation. Common bile duct is normal. Pancreas: Pancreas is normal. No ductal dilatation. No pancreatic inflammation. Spleen: Normal  spleen Adrenals/urinary tract: Adrenal glands normal. There is cortical thinning and scarring in the medial LEFT kidney. No hydronephrosis. Ureters and bladder normal. Stomach/Bowel: Stomach, duodenum small-bowel normal. There multiple diverticula of the ascending, transverse and descending colon without acute inflammation. Rectum normal. No bowel obstruction or inflammation. Vascular/Lymphatic: Abdominal aorta is normal caliber with atherosclerotic calcification. There is no retroperitoneal or periportal lymphadenopathy. No pelvic lymphadenopathy. Reproductive: Post hysterectomy.  Adnexa unremarkable Other: No free fluid. Musculoskeletal: No aggressive osseous lesion. IMPRESSION: 1. No acute findings in the abdomen pelvis. 2. Large 6 cm LEFT breast mass consistent with breast carcinoma. 3. Extensive pancolonic diverticulosis without evidence acute diverticulitis. 4.  Aortic Atherosclerosis (ICD10-I70.0). Electronically Signed   By: Suzy Bouchard M.D.   On: 12/09/2020 15:23   Scheduled Meds: . sodium chloride   Intravenous Once  . COVID-19 mRNA vaccine (Moderna)  0.5 mL Intramuscular ONCE-1600  . lisinopril  10 mg Oral Daily  . pantoprazole (PROTONIX) IV  40 mg Intravenous Q12H   Continuous Infusions: . sodium chloride       LOS: 2 days   Time spent: 35 minutes    Sabriel Borromeo Wynetta Emery, MD How to contact the [facility] Attending or Consulting provider [place] or covering provider during after hours [facility], for this patient?  1. Check the care team in [facility] and look for a) attending/consulting [facility] provider listed and b) the [facility] team listed 2. Log into [email or web address] and use [place]'s universal password to access. If you do not have the password, please contact the hospital operator. 3. Locate the [facility] provider you are looking for under [facility] and page to a number that you can be directly reached. 4. If you still have difficulty reaching the provider, please page the [facility] (Director on Call) for  the Hospitalists listed on amion for assistance.  12/11/2020, 10:40 AM

## 2020-12-11 NOTE — Evaluation (Signed)
Physical Therapy Evaluation Patient Details Name: Brittney Carroll MRN: [id number] DOB: May 29, 1946 Today's Date: 12/11/2020   History of Present Illness  Brittney Carroll is a 74 y.o. female with medical history significant of diverticulosis, HTN, left breast CA, presented with recurrent rectal bleeding and feeling weakness. Patient has had multiple rectal bleeding since summer 2021, usually last for 2 to 3 days with, frank red blood per rectum large quantity and then resolved by its own. No abdominal pain. Rectal bleeding issue tend to come back every once a while, as her estimation once in between 2 to 4 weeks, she did not seek any medical advice until last week she went to see her PCP, and today's blood work showed hemoglobin less than 7.    Clinical Impression  Patient demonstrates slow labored movement for sitting up at bedside with c/o increased pain in bilateral hips during movement, upon standing audible bone on bone contact noted in hips, demonstrates slow labored cadence without loss of balance, but limited mostly due to fatigue and increasing bilateral hip pain and mild dizziness.  Patient requested to go back to bed after therapy due to generalized weakness and having to take prep for procedures tommorrow.  Patient will benefit from continued physical therapy in hospital and recommended venue below to increase strength, balance, endurance for safe ADLs and gait.     Follow Up Recommendations SNF    Equipment Recommendations  None recommended by PT    Recommendations for Other Services       Precautions / Restrictions Precautions Precautions: Fall Restrictions Weight Bearing Restrictions: No      Mobility  Bed Mobility Overal bed mobility: Needs Assistance Bed Mobility: Supine to Sit;Rolling;Sit to Supine Rolling: Min assist   Supine to sit: Min assist;Mod assist Sit to supine: Min assist;Mod assist   General bed mobility comments: slow labored movement with c/o increased  bilateral hip pain    Transfers Overall transfer level: Needs assistance Equipment used: Rolling walker (2 wheeled) Transfers: Sit to/from [facility] Sit to Stand: Min assist Stand pivot transfers: Min assist       General transfer comment: increased time, labored movement  Ambulation/Gait Ambulation/Gait assistance: Min assist Gait Distance (Feet): 35 Feet Assistive device: Rolling walker (2 wheeled) Gait Pattern/deviations: Decreased step length - right;Decreased step length - left;Decreased stride length Gait velocity: decreased   General Gait Details: slow labord movement limited mostly due to c/o fatigue and mild dizziness  Stairs            Wheelchair Mobility    Modified Rankin (Stroke Patients Only)       Balance Overall balance assessment: Needs assistance Sitting-balance support: Feet supported;No upper extremity supported Sitting balance-Leahy Scale: Fair Sitting balance - Comments: fair/good seated at EOB   Standing balance support: During functional activity;Bilateral upper extremity supported Standing balance-Leahy Scale: Fair Standing balance comment: using RW                             Pertinent Vitals/Pain Pain Assessment: Faces Faces Pain Scale: Hurts little more Pain Location: bilateral hips Pain Descriptors / Indicators: Sore;Grimacing;Guarding Pain Intervention(s): Limited activity within patient's tolerance;Monitored during session;Repositioned    Home Living Family/patient expects to be discharged to:: Private residence Living Arrangements: Alone Available Help at Discharge: Family;Available PRN/intermittently Type of Home: House Home Access: Ramped entrance     Home Layout: One level Home Equipment: [place] - 4 wheels  Prior Function Level of Independence: Independent with assistive device(s)         Comments: household ambulator, has her food delivered to home     Hand Dominance         Extremity/Trunk Assessment   Upper Extremity Assessment Upper Extremity Assessment: Generalized weakness    Lower Extremity Assessment Lower Extremity Assessment: Generalized weakness    Cervical / Trunk Assessment Cervical / Trunk Assessment: Normal  Communication   Communication: No difficulties  Cognition Arousal/Alertness: Awake/alert Behavior During Therapy: WFL for tasks assessed/performed Overall Cognitive Status: Within Functional Limits for tasks assessed                                        General Comments      Exercises     Assessment/Plan    PT Assessment Patient needs continued PT services  PT Problem List Decreased strength;Decreased activity tolerance;Decreased mobility;Decreased balance       PT Treatment Interventions DME instruction;Gait training;Stair training;Functional mobility training;Therapeutic activities;Therapeutic exercise;Patient/family education;Balance training    PT Goals (Current goals can be found in the Care Plan section)  Acute Rehab PT Goals Patient Stated Goal: return home able to take care of self PT Goal Formulation: With patient Time For Goal Achievement: 12/25/20 Potential to Achieve Goals: Good    Frequency Min 3X/week   Barriers to discharge        Co-evaluation               AM-PAC PT "6 Clicks" Mobility  Outcome Measure Help needed turning from your back to your side while in a flat bed without using bedrails?: A Little Help needed moving from lying on your back to sitting on the side of a flat bed without using bedrails?: A Lot Help needed moving to and from a bed to a chair (including a wheelchair)?: A Little Help needed standing up from a chair using your arms (e.g., wheelchair or bedside chair)?: A Lot Help needed to walk in hospital room?: A Lot Help needed climbing 3-5 steps with a railing? : A Lot 6 Click Score: 14    End of Session   Activity Tolerance: Patient tolerated  treatment well;Patient limited by lethargy Patient left: in bed;with call bell/phone within reach;with nursing/sitter in room Nurse Communication: Mobility status PT Visit Diagnosis: Unsteadiness on feet (R26.81);Other abnormalities of gait and mobility (R26.89);Muscle weakness (generalized) (M62.81)    Time: 8502-7741 PT Time Calculation (min) (ACUTE ONLY): 32 min   Charges:   PT Evaluation $PT Eval Moderate Complexity: 1 Mod PT Treatments $Therapeutic Activity: 23-37 mins        1:34 PM, 12/11/20 Lonell Grandchild, MPT Physical Therapist with [facility] [phone] office [phone] mobile phone

## 2020-12-11 NOTE — Progress Notes (Signed)
Patient has not even drank half of her GoLYTELY. Her bowels are moving but still passing brownish stool. H&H from this morning is still pending Discussed with Dr. Charna Elizabeth who has requested that we also do an EKG. Will reschedule procedures for tomorrow morning. I told the patient that Dr. Abbey Chatters would be performing the studies and she is agreeable. Patient's condition also discussed with Dr. Irwin Brakeman.  If hemoglobin is less than 8 g she will be given a unit of PRBCs.

## 2020-12-12 ENCOUNTER — Encounter (HOSPITAL_COMMUNITY)
Admission: EM | Disposition: A | Discharge status: Skilled Nursing Facility | Payer: Self-pay | Source: Home / Self Care | Attending: Family Medicine

## 2020-12-12 ENCOUNTER — Inpatient Hospital Stay (HOSPITAL_COMMUNITY): Payer: PPO | Admitting: Anesthesiology

## 2020-12-12 ENCOUNTER — Encounter (HOSPITAL_COMMUNITY): Payer: Self-pay | Admitting: Internal Medicine

## 2020-12-12 DIAGNOSIS — Z515 Encounter for palliative care: Secondary | ICD-10-CM

## 2020-12-12 DIAGNOSIS — K5791 Diverticulosis of intestine, part unspecified, without perforation or abscess with bleeding: Secondary | ICD-10-CM | POA: Diagnosis not present

## 2020-12-12 DIAGNOSIS — C50412 Malignant neoplasm of upper-outer quadrant of left female breast: Secondary | ICD-10-CM | POA: Diagnosis not present

## 2020-12-12 DIAGNOSIS — Z7189 Other specified counseling: Secondary | ICD-10-CM

## 2020-12-12 HISTORY — PX: BIOPSY: SHX5522

## 2020-12-12 HISTORY — PX: ESOPHAGOGASTRODUODENOSCOPY (EGD) WITH PROPOFOL: SHX5813

## 2020-12-12 HISTORY — PX: COLONOSCOPY WITH PROPOFOL: SHX5780

## 2020-12-12 LAB — TYPE AND SCREEN
ABO/RH(D): B POS
Antibody Screen: NEGATIVE
Unit division: 0
Unit division: 0

## 2020-12-12 LAB — BPAM RBC
Blood Product Expiration Date: 202201272359
Blood Product Expiration Date: 202201292359
ISSUE DATE / TIME: 202112241557
ISSUE DATE / TIME: 202112261352
Unit Type and Rh: 5100
Unit Type and Rh: 5100

## 2020-12-12 LAB — CBC
HCT: 30.5 % — ABNORMAL LOW (ref 36.0–46.0)
Hemoglobin: 8.9 g/dL — ABNORMAL LOW (ref 12.0–15.0)
MCH: 23.9 pg — ABNORMAL LOW (ref 26.0–34.0)
MCHC: 29.2 g/dL — ABNORMAL LOW (ref 30.0–36.0)
MCV: 82 fL (ref 80.0–100.0)
Platelets: 393 10*3/uL (ref 150–400)
RBC: 3.72 MIL/uL — ABNORMAL LOW (ref 3.87–5.11)
RDW: 16.1 % — ABNORMAL HIGH (ref 11.5–15.5)
WBC: 11.5 10*3/uL — ABNORMAL HIGH (ref 4.0–10.5)
nRBC: 0 % (ref 0.0–0.2)

## 2020-12-12 LAB — HEMOGLOBIN AND HEMATOCRIT, BLOOD
HCT: 29.4 % — ABNORMAL LOW (ref 36.0–46.0)
Hemoglobin: 8.8 g/dL — ABNORMAL LOW (ref 12.0–15.0)

## 2020-12-12 SURGERY — ESOPHAGOGASTRODUODENOSCOPY (EGD) WITH PROPOFOL
Anesthesia: General

## 2020-12-12 SURGERY — ESOPHAGOGASTRODUODENOSCOPY (EGD) WITH PROPOFOL
Anesthesia: Monitor Anesthesia Care

## 2020-12-12 MED ORDER — PROPOFOL 10 MG/ML IV BOLUS
INTRAVENOUS | Status: AC
  Filled 2020-12-12: qty 20

## 2020-12-12 MED ORDER — PROPOFOL 10 MG/ML IV BOLUS
INTRAVENOUS | Status: DC | PRN
  Administered 2020-12-12: 100 mg via INTRAVENOUS
  Administered 2020-12-12: 170 mg via INTRAVENOUS
  Administered 2020-12-12: 130 mg via INTRAVENOUS

## 2020-12-12 MED ORDER — STERILE WATER FOR IRRIGATION IR SOLN
Status: DC | PRN
  Administered 2020-12-12: 200 mL

## 2020-12-12 MED ORDER — LACTATED RINGERS IV SOLN: INTRAVENOUS | Status: DC

## 2020-12-12 MED ORDER — AMLODIPINE BESYLATE 5 MG PO TABS
5.0000 mg | ORAL_TABLET | Freq: Every day | ORAL | Status: DC
  Administered 2020-12-12 – 2020-12-14 (×3): 5 mg via ORAL
  Filled 2020-12-12 (×3): qty 1

## 2020-12-12 MED ORDER — LACTATED RINGERS IV SOLN: INTRAVENOUS | Status: DC | PRN

## 2020-12-12 NOTE — Interval H&P Note (Signed)
History and Physical Interval Note:  12/12/2020 10:39 AM  Brittney Carroll  has presented today for surgery, with the diagnosis of melena, iron deficiency anemia.  The various methods of treatment have been discussed with the patient and family. After consideration of risks, benefits and other options for treatment, the patient has consented to  Procedure(s): ESOPHAGOGASTRODUODENOSCOPY (EGD) WITH PROPOFOL (N/A) COLONOSCOPY WITH PROPOFOL (N/A) as a surgical intervention.  The patient's history has been reviewed, patient examined, no change in status, stable for surgery.  I have reviewed the patient's chart and labs.  Questions were answered to the patient's satisfaction.     Eloise Harman

## 2020-12-12 NOTE — Op Note (Signed)
[facility] Patient Name: Brittney Carroll Procedure Date: 12/12/2020 11:30 AM MRN: [id number] Date of Birth: 09/14/1946 Attending MD: Elon Alas. Abbey Chatters DO CSN: [id number] Age: 74 Admit Type: Inpatient Procedure:                Upper GI endoscopy Indications:              Acute post hemorrhagic anemia, Melena Providers:                Elon Alas. Abbey Chatters, DO, Lambert Mody, Nelma Rothman, Technician Referring MD:              Medicines:                See the Anesthesia note for documentation of the                            administered medications Complications:            No immediate complications. Estimated Blood Loss:     Estimated blood loss was minimal. Procedure:                Pre-Anesthesia Assessment:                           - The anesthesia plan was to use monitored                            anesthesia care (MAC).                           After obtaining informed consent, the endoscope was                            passed under direct vision. Throughout the                            procedure, the patient's blood pressure, pulse, and                            oxygen saturations were monitored continuously. The                            [id number]-[id number] ([id number]) scope was introduced through the                            mouth, and advanced to the second part of duodenum.                            The upper GI endoscopy was accomplished without                            difficulty. The patient tolerated the procedure                            well. Scope In: 11:45:39  AM Scope Out: 11:49:40 AM Total Procedure Duration: 0 hours 4 minutes 1 second  Findings:      There is no endoscopic evidence of bleeding, esophagitis, inflammation,       ulcerations or varices in the entire esophagus.      Diffuse moderate inflammation characterized by congestion (edema),       erosions and erythema was found in the entire examined stomach. Biopsies        were taken with a cold forceps for Helicobacter pylori testing.      Two non-bleeding superficial duodenal ulcers with no stigmata of       bleeding were found in the first portion of the duodenum.      Localized moderate inflammation characterized by congestion (edema),       erosions and erythema was found in the duodenal bulb and in the first       portion of the duodenum. Impression:               - Gastritis. Biopsied.                           - Non-bleeding duodenal ulcers with no stigmata of                            bleeding.                           - Duodenitis. Moderate Sedation:      Per Anesthesia Care Recommendation:           - Return patient to hospital ward for ongoing care.                           - Advance diet as tolerated.                           - No aspirin, ibuprofen, naproxen, or other                            non-steroidal anti-inflammatory drugs.                           - Await pathology results.                           - Continue on IV PPI BID while inpatient. Discharge                            home on BID PPI for 8 weeks then decrease to once                            daily therafter. Treat for H. pylori if biopsies                            positive. Procedure Code(s):        --- Professional ---                           [phone], Esophagogastroduodenoscopy, flexible,  transoral; with biopsy, single or multiple Diagnosis Code(s):        --- Professional ---                           K29.70, Gastritis, unspecified, without bleeding                           K26.9, Duodenal ulcer, unspecified as acute or                            chronic, without hemorrhage or perforation                           K29.80, Duodenitis without bleeding                           D62, Acute posthemorrhagic anemia                           K92.1, Melena (includes Hematochezia) CPT copyright 2019 American Medical Association. All rights  reserved. The codes documented in this report are preliminary and upon coder review may  be revised to meet current compliance requirements. Elon Alas. Abbey Chatters, DO [place] Abbey Chatters, DO 12/12/2020 12:47:07 PM This report has been signed electronically. Number of Addenda: 0

## 2020-12-12 NOTE — NC FL2 (Signed)
[place] LEVEL OF CARE SCREENING TOOL     IDENTIFICATION  Patient Name: Brittney Carroll Birthdate: July 26, 1946 Sex: female Admission Date (Current Location): 12/09/2020  [facility] and [place] Number:  [employer] and Address:  [facility] [address] [place]      Provider Number: [id number]  Attending Physician Name and Address:  Murlean Iba, MD  Relative Name and Phone Number:  Darlen Round (son) Ph: [phone]    Current Level of Care: Hospital Recommended Level of Care: [place] Prior Approval Number:    Date Approved/Denied:   PASRR Number: [id number]  Discharge Plan: SNF    Current Diagnoses: Patient Active Problem List   Diagnosis Date Noted   Goals of care, counseling/discussion    Palliative care by specialist    GI bleed 12/09/2020    Orientation RESPIRATION BLADDER Height & Weight     Self,Time,Situation,Place  Normal Continent Weight: 187 lb 9.8 oz (85.1 kg) Height:  5\' 6"  (167.6 cm)  BEHAVIORAL SYMPTOMS/MOOD NEUROLOGICAL BOWEL NUTRITION STATUS      Continent Diet (Carb modified)  AMBULATORY STATUS COMMUNICATION OF NEEDS Skin   Limited Assist Verbally Normal                       Personal Care Assistance Level of Assistance  Bathing,Feeding,Dressing Bathing Assistance: Limited assistance Feeding assistance: Independent Dressing Assistance: Limited assistance     Functional Limitations Info  Sight,Hearing,Speech Sight Info: Impaired Hearing Info: Adequate Speech Info: Adequate    SPECIAL CARE FACTORS FREQUENCY  PT (By licensed PT)     PT Frequency: 5x's/week              Contractures Contractures Info: Not present    Additional Factors Info  Code Status,Allergies Code Status Info: Full Allergies Info: Atenolol           Current Medications (12/12/2020):  This is the current hospital active medication list Current Facility-Administered  Medications  Medication Dose Route Frequency Provider Last Rate Last Admin   0.9 %  sodium chloride infusion (Manually program via Guardrails IV Fluids)   Intravenous Once Wynetta Emery, Clanford L, MD       acetaminophen (TYLENOL) tablet 650 mg  650 mg Oral Q6H PRN Lequita Halt, MD       Or   acetaminophen (TYLENOL) suppository 650 mg  650 mg Rectal Q6H PRN Wynetta Fines T, MD       amLODipine (NORVASC) tablet 5 mg  5 mg Oral Daily Johnson, Clanford L, MD   5 mg at 12/12/20 1424   COVID-19 mRNA vaccine (Moderna) injection 0.5 mL  0.5 mL Intramuscular ONCE-1600 Johnson, Clanford L, MD       hydrALAZINE (APRESOLINE) tablet 25 mg  25 mg Oral Q6H PRN Wynetta Fines T, MD       HYDROmorphone (DILAUDID) injection 0.5 mg  0.5 mg Intravenous Q3H PRN Johnson, Clanford L, MD   0.5 mg at 12/12/20 1255   lactated ringers infusion   Intravenous Continuous Kiel, Coralie Keens, MD       lisinopril (ZESTRIL) tablet 10 mg  10 mg Oral Daily Johnson, Clanford L, MD   10 mg at 12/11/20 1023   ondansetron (ZOFRAN) injection 4 mg  4 mg Intravenous Q6H PRN Johnson, Clanford L, MD   4 mg at 12/11/20 1454   oxyCODONE (Oxy IR/ROXICODONE) immediate release tablet 5 mg  5 mg Oral Q3H PRN Murlean Iba, MD  5 mg at 12/11/20 1844   pantoprazole (PROTONIX) injection 40 mg  40 mg Intravenous Q12H Lequita Halt, MD   40 mg at 12/12/20 6237     Discharge Medications: Please see discharge summary for a list of discharge medications.  Relevant Imaging Results:  Relevant Lab Results:   Additional Information SSN: [id number]  Natasha Bence, LCSW

## 2020-12-12 NOTE — TOC Initial Note (Signed)
Transition of Care [facility]) - Initial/Assessment Note    Patient Details  Name: Brittney Carroll MRN: [id number] Date of Birth: 04/10/1946  Transition of Care [facility]) CM/SW Contact:    Natasha Bence, LCSW Phone Number: 12/12/2020, 5:07 PM  Clinical Narrative:                 Patient is a 74 year old female admitted for GI bleed. CSW received SNF recommendation for patient. Patient agreeable to SNF. CSW completed FL2, submitted referral and started auth with [employer]. TOC to follow.  Expected Discharge Plan: Skilled Nursing Facility Barriers to Discharge: Continued Medical Work up   Patient Goals and CMS Choice Patient states their goals for this hospitalization and ongoing recovery are:: SNF CMS Medicare.gov Compare Post Acute Care list provided to:: Patient Choice offered to / list presented to : Patient  Expected Discharge Plan and Services Expected Discharge Plan: [place] In-house Referral: NA   Post Acute Care Choice: NA Living arrangements for the past 2 months: Single Family Home                 DME Arranged: N/A DME Agency: NA       HH Arranged: NA HH Agency: NA        Prior Living Arrangements/Services Living arrangements for the past 2 months: Single Family Home Lives with:: Self Patient language and need for interpreter reviewed:: Yes Do you feel safe going back to the place where you live?: Yes      Need for Family Participation in Patient Care: Yes (Comment) Care giver support system in place?: Yes (comment)   Criminal Activity/Legal Involvement Pertinent to Current Situation/Hospitalization: No - Comment as needed  Activities of Daily Living Home Assistive Devices/Equipment: Walker (specify type),Hand-held shower hose,Eyeglasses,CBG Meter ADL Screening (condition at time of admission) Patient's cognitive ability adequate to safely complete daily activities?: Yes Is the patient deaf or have difficulty hearing?: No Does the  patient have difficulty seeing, even when wearing glasses/contacts?: No Does the patient have difficulty concentrating, remembering, or making decisions?: No Patient able to express need for assistance with ADLs?: Yes Does the patient have difficulty dressing or bathing?: Yes Independently performs ADLs?: Yes (appropriate for developmental age) Does the patient have difficulty walking or climbing stairs?: Yes Weakness of Legs: Both Weakness of Arms/Hands: None  Permission Sought/Granted Permission sought to share information with : Facility Art therapist granted to share information with : Yes, Verbal Permission Granted     Permission granted to share info w AGENCY: Local SNF        Emotional Assessment       Orientation: : Oriented to Self,Oriented to Situation,Oriented to Place,Oriented to  Time Alcohol / Substance Use: Not Applicable Psych Involvement: No (comment)  Admission diagnosis:  GI bleed [K92.2] Patient Active Problem List   Diagnosis Date Noted   Goals of care, counseling/discussion    Palliative care by specialist    GI bleed 12/09/2020   PCP:  Celene Squibb, MD Pharmacy:   [facility], [address] [place] [address] Phone: [phone] Fax: [phone]     Social Determinants of Health (SDOH) Interventions    Readmission Risk Interventions Readmission Risk Prevention Plan 12/12/2020  Post Dischage Appt Complete  Medication Screening Complete  Transportation Screening Complete  Some recent data might be hidden

## 2020-12-12 NOTE — Transfer of Care (Signed)
Immediate Anesthesia Transfer of Care Note  Patient: Brittney Carroll  Procedure(s) Performed: ESOPHAGOGASTRODUODENOSCOPY (EGD) WITH PROPOFOL (N/A ) COLONOSCOPY WITH PROPOFOL (N/A ) BIOPSY  Patient Location: PACU  Anesthesia Type:General  Level of Consciousness: sedated  Airway & Oxygen Therapy: Patient Spontanous Breathing  Post-op Assessment: Report given to RN and Post -op Vital signs reviewed and stable  Post vital signs: Reviewed and stable  Last Vitals:  Vitals Value Taken Time  BP    Temp    Pulse 86 12/12/20 1229  Resp 25 12/12/20 1229  SpO2 96 % 12/12/20 1229  Vitals shown include unvalidated device data.  Last Pain:  Vitals:   12/12/20 1139  TempSrc:   PainSc: 8       Patients Stated Pain Goal: 3 (51/46/04 7998)  Complications: No complications documented.

## 2020-12-12 NOTE — Progress Notes (Signed)
Tap water enema given x4 (approx. 1272mL) until output was clear. Output was clear with blood tinge. Endo made aware, patient transported to endo with PACU nurse.

## 2020-12-12 NOTE — Op Note (Signed)
[facility] Patient Name: Brittney Carroll Procedure Date: 12/12/2020 11:50 AM MRN: [id number] Date of Birth: March 05, 1946 Attending MD: Elon Alas. Abbey Chatters DO CSN: [id number] Age: 74 Admit Type: Inpatient Procedure:                Colonoscopy Indications:              Hematochezia, Melena Providers:                Elon Alas. Abbey Chatters, DO, Lambert Mody, Nelma Rothman, Technician Referring MD:              Medicines:                See the Anesthesia note for documentation of the                            administered medications Complications:            No immediate complications. Estimated Blood Loss:     Estimated blood loss: none. Procedure:                Pre-Anesthesia Assessment:                           - The anesthesia plan was to use monitored                            anesthesia care (MAC).                           After obtaining informed consent, the colonoscope                            was passed under direct vision. Throughout the                            procedure, the patient's blood pressure, pulse, and                            oxygen saturations were monitored continuously. The                            PCF-H190DL ([id number]) scope was introduced through                            the anus and advanced to the the cecum, identified                            by appendiceal orifice and ileocecal valve. The                            colonoscopy was technically difficult and complex                            due to a redundant colon, significant looping and a  tortuous colon. Successful completion of the                            procedure was aided by applying abdominal pressure.                            The patient tolerated the procedure fairly well.                            The quality of the bowel preparation was evaluated                            using the BBPS [facility] Bowel Preparation Scale)                             with scores of: Right Colon = 2 (minor amount of                            residual staining, small fragments of stool and/or                            opaque liquid, but mucosa seen well), Transverse                            Colon = 2 (minor amount of residual staining, small                            fragments of stool and/or opaque liquid, but mucosa                            seen well) and Left Colon = 2 (minor amount of                            residual staining, small fragments of stool and/or                            opaque liquid, but mucosa seen well). The total                            BBPS score equals 6. The quality of the bowel                            preparation was fair. Scope In: 11:55:49 AM Scope Out: 12:22:15 PM Scope Withdrawal Time: 0 hours 5 minutes 35 seconds  Total Procedure Duration: 0 hours 26 minutes 26 seconds  Findings:      Hemorrhoids were found on perianal exam.      Non-bleeding internal hemorrhoids were found during endoscopy.      Many small and large-mouthed diverticula were found in the entire colon.      Clotted blood was found in the transverse colon.      The colon (entire examined portion) was significantly tortuous.       Advancing the scope required applying abdominal pressure. Impression:               -  Preparation of the colon was fair.                           - Hemorrhoids found on perianal exam.                           - Non-bleeding internal hemorrhoids.                           - Diverticulosis in the entire examined colon.                           - Blood in the transverse colon.                           - No specimens collected. Moderate Sedation:      Per Anesthesia Care Recommendation:           - Return patient to hospital ward for ongoing care.                           - Advance diet as tolerated.                           - A few blood clots noted in the trasnvwerse colon.                             No actively bleeding lesion found.                           - Prep was not ideal, cannot rule out small polyps.                            Can consider repeating colonoscopy in 6-12 months.                            See EGD note for other recommendations. Procedure Code(s):        --- Professional ---                           [phone], Colonoscopy, flexible; diagnostic, including                            collection of specimen(s) by brushing or washing,                            when performed (separate procedure) Diagnosis Code(s):        --- Professional ---                           H70.2, Other hemorrhoids                           K92.2, Gastrointestinal hemorrhage, unspecified  K92.1, Melena (includes Hematochezia)                           K57.30, Diverticulosis of large intestine without                            perforation or abscess without bleeding CPT copyright 2019 American Medical Association. All rights reserved. The codes documented in this report are preliminary and upon coder review may  be revised to meet current compliance requirements. Elon Alas. Abbey Chatters, DO [place] Abbey Chatters, DO 12/12/2020 12:55:49 PM This report has been signed electronically. Number of Addenda: 0

## 2020-12-12 NOTE — Anesthesia Preprocedure Evaluation (Signed)
Anesthesia Evaluation  Patient identified by MRN, date of birth, ID band Patient awake    Reviewed: Allergy & Precautions, H&P , NPO status , Patient's Chart, lab work & pertinent test results, reviewed documented beta blocker date and time   Airway Mallampati: II  TM Distance: >3 FB Neck ROM: full    Dental no notable dental hx. (+) Teeth Intact   Pulmonary neg pulmonary ROS, former smoker,    Pulmonary exam normal breath sounds clear to auscultation       Cardiovascular Exercise Tolerance: Good hypertension, negative cardio ROS   Rhythm:regular Rate:Normal     Neuro/Psych negative neurological ROS  negative psych ROS   GI/Hepatic negative GI ROS, Neg liver ROS,   Endo/Other  negative endocrine ROSdiabetes, Type 2  Renal/GU negative Renal ROS  negative genitourinary   Musculoskeletal   Abdominal   Peds  Hematology negative hematology ROS (+)   Anesthesia Other Findings   Reproductive/Obstetrics negative OB ROS                             Anesthesia Physical Anesthesia Plan  ASA: II  Anesthesia Plan: General   Post-op Pain Management:    Induction:   PONV Risk Score and Plan: Propofol infusion  Airway Management Planned:   Additional Equipment:   Intra-op Plan:   Post-operative Plan:   Informed Consent: I have reviewed the patients History and Physical, chart, labs and discussed the procedure including the risks, benefits and alternatives for the proposed anesthesia with the patient or authorized representative who has indicated his/her understanding and acceptance.     Dental Advisory Given  Plan Discussed with: CRNA  Anesthesia Plan Comments:         Anesthesia Quick Evaluation

## 2020-12-12 NOTE — Progress Notes (Signed)
PROGRESS NOTE   Brittney Carroll  PPI:[id number] DOB: September 28, 1946 DOA: 12/09/2020 PCP: Celene Squibb, MD   Chief Complaint  Patient presents with  . Abnormal Lab    Brief Admission History:  This unfortunate 74 year old female with left breast cancer diverticulosis recurrent rectal bleeding chronic pelvic pain chronic hip pain presented to the emergency department complaining of 2 to 3 days of bright red blood per rectum and associated with weakness and abnormal labs from PCP reported as hemoglobin less than 7.  Unfortunately she has been treating her chronic hip pain with aspirin every 4 hours because she did not have transportation to continue to see her pain management specialist and has no longer been taking opioids for pain relief.  She had not sought diagnosis or treatment of the left breast mass and at this time has decided not to pursue further treatment.  Assessment & Plan:   Active Problems:   GI bleed   Goals of care, counseling/discussion   Palliative care by specialist   1. Recurrent GI bleeding-treated supportively.  GI consultation requested and planning EGD/colon 12/27 with findings of gastritis, duodenal ulcers and duodenitis.  Colonoscopy with findings of old blood in transverse colon,  Internal hemorrhoids, diverticula.  Patient is adamant she does not want any intra-abdominal surgeries. 2. Left breast cancer-I asked for a surgery consult to see if patient would be willing to have a biopsy done while she is in the hospital unfortunately she has declined any further work-up or treatment.  Therefore I have asked for palliative medicine consultation. 3. Chronic hip pain-patient is now at end-of-life and I have asked for a palliative consult for symptom management.  I am restarting opioid therapy for pain management. 4. Essential hypertension-monitor blood pressure and treating with home lisinopril.   DVT prophylaxis: SCD Code Status: Goals of care  Family Communication: Son  present at bedside 12/25 Disposition: TBD  Status is: Inpatient  Remains inpatient appropriate because:Inpatient level of care appropriate due to severity of illness  Dispo: The patient is from: Home              Anticipated d/c is to: Home              Anticipated d/c date is: 1 day              Patient currently is not medically stable to d/c.  Consultants:   GI  Surgery  Palliative medicine  Procedures:     Antimicrobials:     Subjective: Patient reports severe hip pain and says pain medication seems to be working.   Objective: Vitals:   12/12/20 1110 12/12/20 1229 12/12/20 1230 12/12/20 1257  BP:  (!) 163/81 (!) 163/81 (!) 166/85  Pulse: 96 86 86 83  Resp: 20 (!) 25 20 20   Temp:  (!) 97.5 F (36.4 C)  (!) 97.4 F (36.3 C)  TempSrc: Oral   Oral  SpO2:  96% 97% 96%  Weight:      Height:        Intake/Output Summary (Last 24 hours) at 12/12/2020 1306 Last data filed at 12/12/2020 1228 Gross per 24 hour  Intake 1650 ml  Output 700 ml  Net 950 ml   Filed Weights   12/09/20 1121 12/09/20 1904 12/10/20 0521  Weight: 86.2 kg 89.7 kg 85.1 kg    Examination:  General exam: Appears calm and comfortable. Appears pale.  Respiratory system: Clear to auscultation. Respiratory effort normal. Cardiovascular system: normal S1 & S2 heard.  No JVD, murmurs, rubs, gallops or clicks. No pedal edema. Gastrointestinal system: Abdomen is nondistended, soft and nontender. No organomegaly or masses felt. Normal bowel sounds heard. Central nervous system: Alert and oriented. No focal neurological deficits. Extremities: severe bilateral hip pain and discomfort, Symmetric 5 x 5 power. Skin: No rashes, lesions or ulcers.  Psychiatry: Mood & affect appropriate.   Data Reviewed: I have personally reviewed following labs and imaging studies  CBC: Recent Labs  Lab 12/09/20 1220 12/10/20 0710 12/11/20 1221 12/11/20 2325 12/12/20 0535  WBC 10.4 9.1 12.2*  --  11.5*   NEUTROABS 6.4  --   --   --   --   HGB 6.2* 7.4* 7.7* 8.8* 8.9*  HCT 20.9* 25.1* 26.0* 29.4* 30.5*  MCV 76.8* 78.9* 79.3*  --  82.0  PLT 419* 371 366  --  546    Basic Metabolic Panel: Recent Labs  Lab 12/09/20 1220  NA 137  K 3.7  CL 105  CO2 24  GLUCOSE 228*  BUN 19  CREATININE 0.79  CALCIUM 9.3    GFR: Estimated Creatinine Clearance: 67.8 mL/min (by C-G formula based on SCr of 0.79 mg/dL).  Liver Function Tests: Recent Labs  Lab 12/09/20 1220  AST 14*  ALT 12  ALKPHOS 83  BILITOT 0.3  PROT 6.8  ALBUMIN 3.5    CBG: No results for input(s): GLUCAP in the last 168 hours.  Recent Results (from the past 240 hour(s))  Resp Panel by RT-PCR (Flu A&B, Covid) Nasopharyngeal Swab     Status: None   Collection Time: 12/09/20 12:53 PM   Specimen: Nasopharyngeal Swab; Nasopharyngeal(NP) swabs in vial transport medium  Result Value Ref Range Status   SARS Coronavirus 2 by RT PCR NEGATIVE NEGATIVE Final    Comment: (NOTE) SARS-CoV-2 target nucleic acids are NOT DETECTED.  The SARS-CoV-2 RNA is generally detectable in upper respiratory specimens during the acute phase of infection. The lowest concentration of SARS-CoV-2 viral copies this assay can detect is 138 copies/mL. A negative result does not preclude SARS-Cov-2 infection and should not be used as the sole basis for treatment or other patient management decisions. A negative result may occur with  improper specimen collection/handling, submission of specimen other than nasopharyngeal swab, presence of viral mutation(s) within the areas targeted by this assay, and inadequate number of viral copies(<138 copies/mL). A negative result must be combined with clinical observations, patient history, and epidemiological information. The expected result is Negative.  Fact Sheet for Patients:  EntrepreneurPulse.com.au  Fact Sheet for Healthcare Providers:   IncredibleEmployment.be  This test is no t yet approved or cleared by the [place] FDA and  has been authorized for detection and/or diagnosis of SARS-CoV-2 by FDA under an Emergency Use Authorization (EUA). This EUA will remain  in effect (meaning this test can be used) for the duration of the COVID-19 declaration under Section 564(b)(1) of the Act, 21 U.S.C.section 360bbb-3(b)(1), unless the authorization is terminated  or revoked sooner.       Influenza A by PCR NEGATIVE NEGATIVE Final   Influenza B by PCR NEGATIVE NEGATIVE Final    Comment: (NOTE) The Xpert Xpress SARS-CoV-2/FLU/RSV plus assay is intended as an aid in the diagnosis of influenza from Nasopharyngeal swab specimens and should not be used as a sole basis for treatment. Nasal washings and aspirates are unacceptable for Xpert Xpress SARS-CoV-2/FLU/RSV testing.  Fact Sheet for Patients: EntrepreneurPulse.com.au  Fact Sheet for Healthcare Providers: IncredibleEmployment.be  This test is not yet approved  or cleared by the [place] and has been authorized for detection and/or diagnosis of SARS-CoV-2 by FDA under an Emergency Use Authorization (EUA). This EUA will remain in effect (meaning this test can be used) for the duration of the COVID-19 declaration under Section 564(b)(1) of the Act, 21 U.S.C. section 360bbb-3(b)(1), unless the authorization is terminated or revoked.  Performed at [facility], [address] [place], [place] [address]      Radiology Studies: No results found. Scheduled Meds: . sodium chloride   Intravenous Once  . COVID-19 mRNA vaccine (Moderna)  0.5 mL Intramuscular ONCE-1600  . lisinopril  10 mg Oral Daily  . pantoprazole (PROTONIX) IV  40 mg Intravenous Q12H   Continuous Infusions:    LOS: 3 days   Time spent: 35 minutes    Denys Salinger Wynetta Emery, MD How to contact the [facility] Attending or Consulting provider [place] or covering provider during after hours [place], for this patient?  1. Check the care team in [facility] and look for a) attending/consulting [facility] provider listed and b) the [facility] team listed 2. Log into [email or web address] and use [facility] universal password to access. If you do not have the password, please contact the hospital operator. 3. Locate the [facility] provider you are looking for under [facility] and page to a number that you can be directly reached. 4. If you still have difficulty reaching the provider, please page the [facility] (Director on Call) for the Hospitalists listed on amion for assistance.  12/12/2020, 1:06 PM

## 2020-12-12 NOTE — Progress Notes (Signed)
Interventional Radiology Progress Note  74 yo female admitted to [facility] for GI bleeding.   Discovery of breast mass, for which [facility] was contacted potential bx.   Given the presentation of this breast finding, the proper pathway for her is a formal mammo workup, with anticipated entry into the diagnostic arm.  Have discussed with Dr. Wynetta Emery, and sent info to the [facility] Breast dept, for future appointment. If patient needs info, she may call the [place].   Thank you  Signed,  Dulcy Fanny. Earleen Newport, DO

## 2020-12-12 NOTE — Plan of Care (Signed)

## 2020-12-12 NOTE — Plan of Care (Signed)
Palliative: Chart review completed, consult with attending physician. Attempted to see Ms. Beebe, but she is off the floor. Palliative medicine team to follow-up tomorrow.  No charge Quinn Axe, NP Palliative medicine team Team phone [phone] Greater than 50% of this time was spent counseling and coordinating care related to the above assessment and plan.

## 2020-12-12 NOTE — Anesthesia Postprocedure Evaluation (Signed)
Anesthesia Post Note  Patient: Brittney Carroll  Procedure(s) Performed: ESOPHAGOGASTRODUODENOSCOPY (EGD) WITH PROPOFOL (N/A ) COLONOSCOPY WITH PROPOFOL (N/A ) BIOPSY  Patient location during evaluation: PACU Anesthesia Type: General Level of consciousness: awake Pain management: pain level controlled Vital Signs Assessment: post-procedure vital signs reviewed and stable Respiratory status: spontaneous breathing Cardiovascular status: blood pressure returned to baseline Anesthetic complications: no   No complications documented.   Last Vitals:  Vitals:   12/12/20 0635 12/12/20 1110  BP: (!) 174/81   Pulse: 88 96  Resp: 18 20  Temp: 36.7 C   SpO2: 95%     Last Pain:  Vitals:   12/12/20 1139  TempSrc:   PainSc: Anthem

## 2020-12-13 DIAGNOSIS — K921 Melena: Secondary | ICD-10-CM

## 2020-12-13 DIAGNOSIS — K2970 Gastritis, unspecified, without bleeding: Secondary | ICD-10-CM | POA: Diagnosis not present

## 2020-12-13 DIAGNOSIS — K264 Chronic or unspecified duodenal ulcer with hemorrhage: Secondary | ICD-10-CM | POA: Diagnosis not present

## 2020-12-13 DIAGNOSIS — Z7189 Other specified counseling: Secondary | ICD-10-CM | POA: Diagnosis not present

## 2020-12-13 DIAGNOSIS — K2980 Duodenitis without bleeding: Secondary | ICD-10-CM | POA: Diagnosis not present

## 2020-12-13 DIAGNOSIS — K269 Duodenal ulcer, unspecified as acute or chronic, without hemorrhage or perforation: Secondary | ICD-10-CM | POA: Diagnosis not present

## 2020-12-13 DIAGNOSIS — Z515 Encounter for palliative care: Secondary | ICD-10-CM | POA: Diagnosis not present

## 2020-12-13 DIAGNOSIS — K922 Gastrointestinal hemorrhage, unspecified: Secondary | ICD-10-CM | POA: Diagnosis not present

## 2020-12-13 DIAGNOSIS — D649 Anemia, unspecified: Secondary | ICD-10-CM | POA: Diagnosis not present

## 2020-12-13 LAB — CBC
HCT: 29.3 % — ABNORMAL LOW (ref 36.0–46.0)
Hemoglobin: 8.7 g/dL — ABNORMAL LOW (ref 12.0–15.0)
MCH: 24.3 pg — ABNORMAL LOW (ref 26.0–34.0)
MCHC: 29.7 g/dL — ABNORMAL LOW (ref 30.0–36.0)
MCV: 81.8 fL (ref 80.0–100.0)
Platelets: 365 10*3/uL (ref 150–400)
RBC: 3.58 MIL/uL — ABNORMAL LOW (ref 3.87–5.11)
RDW: 16.2 % — ABNORMAL HIGH (ref 11.5–15.5)
WBC: 10.8 10*3/uL — ABNORMAL HIGH (ref 4.0–10.5)
nRBC: 0 % (ref 0.0–0.2)

## 2020-12-13 LAB — SARS CORONAVIRUS 2 (TAT 6-24 HRS): SARS Coronavirus 2: NEGATIVE

## 2020-12-13 MED ORDER — FERROUS SULFATE 325 (65 FE) MG PO TABS
325.0000 mg | ORAL_TABLET | Freq: Every day | ORAL | Status: DC
  Administered 2020-12-14: 325 mg via ORAL
  Filled 2020-12-13: qty 1

## 2020-12-13 MED ORDER — POLYETHYLENE GLYCOL 3350 17 G PO PACK
17.0000 g | PACK | Freq: Every day | ORAL | Status: DC
  Administered 2020-12-13 – 2020-12-14 (×2): 17 g via ORAL
  Filled 2020-12-13 (×2): qty 1

## 2020-12-13 MED ORDER — PANTOPRAZOLE SODIUM 40 MG PO TBEC
40.0000 mg | DELAYED_RELEASE_TABLET | Freq: Two times a day (BID) | ORAL | Status: DC
  Administered 2020-12-13: 40 mg via ORAL
  Filled 2020-12-13: qty 1

## 2020-12-13 NOTE — TOC Progression Note (Addendum)
Transition of Care [facility]) - Progression Note    Patient Details  Name: ALIZAE BECHTEL MRN: [id number] Date of Birth: 1946-05-02  Transition of Care [facility]) CM/SW Contact  Salome Arnt, [place] Phone Number: 12/13/2020, 11:13 AM  Clinical Narrative:  LCSW provided bed offer at [facility]. Pt requesting [facility] or [facility], but per facilities, they are unable to accept due to the fact that pt is not vaccinated. Pt considering whether she would want [facility] or [facility] to review referral. TOC will follow up this afternoon.    Update: Pt requesting [facility] [place]. Referral sent. Facility is unsure if they accept pt's insurance.     Expected Discharge Plan: [place] Barriers to Discharge: Continued Medical Work up  Expected Discharge Plan and Services Expected Discharge Plan: [place] In-house Referral: NA   Post Acute Care Choice: NA Living arrangements for the past 2 months: Single Family Home                 DME Arranged: N/A DME Agency: NA       HH Arranged: NA HH Agency: NA         Social Determinants of Health (SDOH) Interventions    Readmission Risk Interventions Readmission Risk Prevention Plan 12/12/2020  Post Dischage Appt Complete  Medication Screening Complete  Transportation Screening Complete  Some recent data might be hidden

## 2020-12-13 NOTE — Consult Note (Signed)
Consultation Note Date: 12/13/2020   Patient Name: Brittney Carroll  DOB: 10-12-1946  MRN: [id number]  Age / Sex: 74 y.o., female  PCP: Celene Squibb, MD Referring Physician: Murlean Iba, MD  Reason for Consultation: Establishing goals of care and Psychosocial/spiritual support  HPI/Patient Profile: 74 y.o. 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   Clinical Assessment and Goals of Care: I have reviewed medical records including EPIC notes, labs and imaging, received report from attending hospitalist, examined the patient and met at bedside with Brittney Carroll to discuss diagnosis prognosis, GOC, EOL wishes, disposition and options.  Brittney Carroll is sitting up in bed. She greets me making and keeping eye contact.  She is alert and oreinted, able to [facility] her needs known. There is no family at bedside at this time. I introduced Palliative Medicine as specialized medical care for people living with serious illness. It focuses on providing relief from the symptoms and stress of a serious illness.   We discussed a brief life review of the patient. She is divorced, has 3 adult children. No longer drives, rents a home.   As far as functional and nutritional status, she shares with me that it is becoming more diffucult to car for herslef and she feels that she may need ALF.    We discussed her current illness and what it means in the larger context of her on-going co-morbidities.  Natural disease trajectory and expectations at EOL were discussed.  We talk in detail about her acute health concerns including, but not limited to upper and lower scope results, recommendations for mammography pathway to evaluate breast mass.  I attempted to elicit values and goals of care important to the patient.   [name] shares her concern over her ability to continue to care for herself stating that she does not want to impose upon her children to care for her.  She states that she has seen times where this causes division and difficulties in families.  She shares that her sister died in her 58s with cancer.  She expresses her feelings over her sister's cancer treatment and eventual death and hospice.  The difference between aggressive medical intervention and comfort care was considered in light of the patient's goals of care.  We talked about outpatient palliative services to follow, continuing to talk about the "what if's and maybe's".  We talked about preferred place of death after I bring up [place].  [name] states that she does not want aggressive care, but also does not want to die in a nursing home.  [name] smiles when I mention passing away in a residential hospice.  She shares that that would be her choice, relating her experiences with her sister and how she received such loving care.  Advanced directives, concepts specific to code status, were considered and discussed.  [name] shares that she would not want attempted resuscitation.  She endorses DNR status, orders changed.  Questions and concerns were addressed.  The family was encouraged to call with questions or concerns.  [name] is focused on finding the right short-term rehab.  We talked in detail about what is and is not provided in short-term rehab such as length of time for stay, co-pay days after day 20, working with Education officer, museum for disposition.  I share that transition care team will continue to work with her to provide more detailed answers.  Conference with attending, bedside nursing staff, transition of care team related to patient condition, needs, goals of care.    HCPOA   NEXT OF KIN -Brittney Carroll states that she would name her daughter, Brittney Carroll, as her healthcare surrogate.  She has 2 other children,  Darlen Round and Georgina Pillion.  I encouraged her to make sure that her daughter [name] will follow her wishes.    SUMMARY OF RECOMMENDATIONS   Continue to treat the treatable but no CPR or intubation Agreeable to short-term rehab. At this point is agreeable for work-up for left breast mass At this point states she would not accept chemotherapy or radiation Outpatient palliative to follow   Code Status/Advance Care Planning:  DNR  Symptom Management:   Per hospitalist, no additional needs at this time.  Palliative Prophylaxis:   Frequent Pain Assessment, Palliative Wound Care and Turn Reposition  Additional Recommendations (Limitations, Scope, Preferences):  Treat the treatable but no CPR or intubation.  Psycho-social/Spiritual:   Desire for further Chaplaincy support:no  Additional Recommendations: Caregiving  Support/Resources and Education on Hospice  Prognosis:   Unable to determine, based on outcomes.  6 to 12 months or less would not be surprising based on current functional status, chronic illness burden.  Discharge Planning: Agreeable to short-term rehab      Primary Diagnoses: Present on Admission: . GI bleed   I have reviewed the medical record, interviewed the patient and family, and examined the patient. The following aspects are pertinent.  Past Medical History:  Diagnosis Date  . Arthritis   . Breast mass, left   . Cancer ([place])    vaginal 2008  . Diabetes mellitus without complication ([place])   . Hyperlipidemia   . Hypertension    Social History   Socioeconomic History  . Marital status: Divorced    Spouse name: Not on file  . Number of children: Not on file  . Years of education: Not on file  . Highest education level: Not on file  Occupational History  . Not on file  Tobacco Use  . Smoking status: Former Smoker    Packs/day: 1.50    Years: 7.00    Pack years: 10.50    Types: Cigarettes  . Smokeless tobacco: Never Used  .  Tobacco comment: quit 50 yrs ago  Vaping Use  . Vaping Use: Never used  Substance and Sexual Activity  . Alcohol use: Not Currently  . Drug use: Never  . Sexual activity: Not on file  Other Topics Concern  . Not on file  Social History Narrative  . Not on file   Social Determinants of Health   Financial Resource Strain: Not on file  Food Insecurity: Not on file  Transportation Needs: Not on file  Physical Activity: Not on file  Stress: Not on file  Social Connections: Not on file   Family History  Problem Relation Age of Onset  . Breast cancer Sister        Deceased in  her 40s   Scheduled Meds: . sodium chloride   Intravenous Once  . amLODipine  5 mg Oral Daily  . lisinopril  10 mg Oral Daily  . pantoprazole  40 mg Oral BID  . polyethylene glycol  17 g Oral Daily   Continuous Infusions: . lactated ringers     PRN Meds:.acetaminophen **OR** acetaminophen, hydrALAZINE, HYDROmorphone (DILAUDID) injection, ondansetron (ZOFRAN) IV, oxyCODONE Medications Prior to Admission:  Prior to Admission medications   Medication Sig Start Date End Date Taking? Authorizing Provider  lisinopril (PRINIVIL,ZESTRIL) 10 MG tablet Take 10 mg by mouth daily.   Yes [provider]  olmesartan (BENICAR) 40 MG tablet Take 40 mg by mouth daily. 07/12/20   [provider]   Allergies  Allergen Reactions  . Atenolol Other (See Comments)    Makes BP drop suddenly and feels lifeless   Review of Systems  Unable to perform ROS: Other    Physical Exam Vitals and nursing note reviewed.     Vital Signs: BP (!) 163/65 (BP Location: Right Wrist)   Pulse 87   Temp 98.1 F (36.7 C) (Oral)   Resp 16   Ht 5' 6"  (1.676 m)   Wt 85.1 kg   SpO2 96%   BMI 30.28 kg/m  Pain Scale: 0-10 POSS *See Group Information*: 1-Acceptable,Awake and alert Pain Score: 8    SpO2: SpO2: 96 % O2 Device:SpO2: 96 % O2 Flow Rate: .O2 Flow Rate (L/min): 2 L/min  IO: Intake/output summary:    Intake/Output Summary (Last 24 hours) at 12/13/2020 1054 Last data filed at 12/13/2020 0500 Gross per 24 hour  Intake 1040 ml  Output 200 ml  Net 840 ml    LBM: Last BM Date: 12/12/20 Baseline Weight: Weight: 86.2 kg Most recent weight: Weight: 85.1 kg     Palliative Assessment/Data:   Flowsheet Rows   Flowsheet Row Most Recent Value  Intake Tab   Referral Department Hospitalist  Unit at Time of Referral Med/Surg Unit  Palliative Care Primary Diagnosis Other (Comment)  Date Notified 12/11/20  Palliative Care Type New Palliative care  Reason for referral Clarify Goals of Care  Date of Admission 12/09/20  Date first seen by Palliative Care 12/12/20  # of days Palliative referral response time 1 Day(s)  # of days IP prior to Palliative referral 2  Clinical Assessment   Palliative Performance Scale Score 50%  Pain Max last 24 hours Not able to report  Pain Min Last 24 hours Not able to report  Dyspnea Max Last 24 Hours Not able to report  Dyspnea Min Last 24 hours Not able to report  Psychosocial & Spiritual Assessment   Palliative Care Outcomes       Time In: 1020 Time Out: 1130 Time Total: 70 minutes  Greater than 50%  of this time was spent counseling and coordinating care related to the above assessment and plan.  Signed by: Drue Novel, NP   Please contact Palliative Medicine Team phone at [phone] for questions and concerns.  For individual provider: See Shea Evans

## 2020-12-13 NOTE — Care Management Important Message (Signed)
Important Message  Patient Details  Name: Brittney Carroll MRN: [id number] Date of Birth: February 09, 1946   Medicare Important Message Given:  Yes     Tommy Medal 12/13/2020, 11:45 AM

## 2020-12-13 NOTE — Progress Notes (Signed)
Subjective: Last BM was yesterday morning. Not sure if she had rectal bleeding. BMs range from every other day to every 3 days for the last year. Stools are hard at times. She has been taking a stool softener daily for about 1 month with no improvement.  Notes some improvement with bran cereal.  No abdominal pain. No significant history of GERD. Occasional indigestion. No nausea or vomiting. Tolerating diet well.   States she has changed her mind and is planning to pursue further evaluation of breast mass once placed in SNF for rehab as they will be able to help with transportation.    Objective: Vital signs in last 24 hours: Temp:  [97.4 F (36.3 C)-98.7 F (37.1 C)] 98.1 F (36.7 C) (12/28 0738) Pulse Rate:  [83-103] 87 (12/28 0738) Resp:  [16-25] 16 (12/28 0738) BP: (138-166)/(62-85) 163/65 (12/28 0738) SpO2:  [88 %-97 %] 96 % (12/28 0738) Last BM Date: 12/12/20 General:   Alert and oriented, pleasant, well developed, well nourished, no acute distress.  Head:  Normocephalic and atraumatic. Eyes:  No icterus, sclera clear. Conjuctiva pink.  Abdomen:  Bowel sounds present, soft, non-tender, non-distended. No HSM or hernias noted. No rebound or guarding. No masses appreciated  Msk:  Symmetrical without gross deformities. Normal posture. Extremities:  Without edema. Neurologic:  Alert and  oriented x4;  grossly normal neurologically. Psych:  Normal mood and affect.  Intake/Output from previous day: 12/27 0701 - 12/28 0700 In: 1040 [P.O.:240; I.V.:800] Out: 200 [Urine:200] Intake/Output this shift: No intake/output data recorded.  Lab Results: Recent Labs    12/11/20 1221 12/11/20 2325 12/12/20 0535 12/13/20 0522  WBC 12.2*  --  11.5* 10.8*  HGB 7.7* 8.8* 8.9* 8.7*  HCT 26.0* 29.4* 30.5* 29.3*  PLT 366  --  393 365   Assessment: 74 year old female who presented to the emergency room reporting 6 months of melena, rectal bleeding, and profound anemia on recent labs  with PCP.  Hemoglobin 6.2 on admission.  She admitted to taking 2 aspirin every 4 hours due to intractable hip and shoulder pain as she had not been able to keep follow-up appointment with PCP to obtain narcotic prescription.  Iron studies this admission confirmed IDA.  She received 2 units PRBCs with posttransfusion hemoglobin 8.8.  Underwent EGD and colonoscopy 12/27.  EGD with gastritis s/p biopsied, nonbleeding duodenal ulcers with no stigmata of bleeding, and duodenitis.  Colonoscopy with small [place] pancolonic diverticula, clotted blood in the transverse colon with no active bleeding, hemorrhoids.  Notably, prep was not ideal.  Today, hemoglobin is stable at 8.7.  No overt GI bleeding today.  No abdominal pain, nausea, or vomiting.    Melena secondary to duodenal ulcers, gastritis, and duodenitis in the setting of daily aspirin.  Rectal bleeding may have been hemorrhoidal and possibly diverticular with clotted blood noted in transverse colon- likely influenced by daily aspirin.   Constipation: Patient reports history of constipation x1 year. Not on a daily bowel regimen. Will start MiraLAX 17 g daily.   Plan: 1.  Continue Protonix 40 mg twice daily x8 weeks, then decrease to once daily. 2.  Avoid all NSAIDs moving forward.  Counseled extensively on this. 3.  Monitor H/H. 4.  Monitor for ongoing overt GI bleeding. 5.  Follow-up on pathology.  Treat H. pylori if biopsies are positive. 6.  Start MiraLAX 17 g daily for constipation. 7.  She will need follow-up with Dr. Laural Golden. Consider repeat colonoscopy in 6-12 months due to  poor prep.    LOS: 4 days    12/13/2020, 9:52 AM   Aliene Altes, PA-C [facility]

## 2020-12-13 NOTE — Progress Notes (Signed)
PROGRESS NOTE   Brittney Carroll  NFA:[id number] DOB: 02/03/1946 DOA: 12/09/2020 PCP: Celene Squibb, MD   Chief Complaint  Patient presents with  . Abnormal Lab    Brief Admission History:  This unfortunate 74 year old female with left breast cancer diverticulosis recurrent rectal bleeding chronic pelvic pain chronic hip pain presented to the emergency department complaining of 2 to 3 days of bright red blood per rectum and associated with weakness and abnormal labs from PCP reported as hemoglobin less than 7.  Unfortunately she has been treating her chronic hip pain with aspirin every 4 hours because she did not have transportation to continue to see her pain management specialist and has no longer been taking opioids for pain relief.  She had not sought diagnosis or treatment of the left breast mass and at this time has decided not to pursue further treatment.  Assessment & Plan:   Active Problems:   GI bleed   Goals of care, counseling/discussion   Palliative care by specialist   Symptomatic anemia   1. Recurrent GI bleeding-treated supportively.  GI consultation requested and planning EGD/colon 12/27 with findings of gastritis, duodenal ulcers and duodenitis.  Colonoscopy with findings of old blood in transverse colon,  Internal hemorrhoids, diverticula.  Patient is adamant she does not want any intra-abdominal surgeries.  Pt advised to avoid NSAIDs.   2. Left breast cancer-I asked for a surgery consult to see if patient would be willing to have a biopsy done while she is in the hospital unfortunately she had initially declined any further work-up or treatment.  On 12/27 she changed her mind and wanted to pursue workup.  I contacted IR and was told that patient needed to follow the breast mass protocol starting with mammogram first.  [facility].   I have asked for palliative medicine consultation for goals of care  discussions. 3. Chronic hip pain-suspect avascular necrosis of hips.  I am restarting opioid therapy for pain management. 4. Essential hypertension-monitor blood pressure and treating with home lisinopril, added amlodipine.   DVT prophylaxis: SCD Code Status: Goals of care  Family Communication: Son present at bedside 12/25, 12/28 Disposition: SNF   Status is: Inpatient  Remains inpatient appropriate because:Inpatient level of care appropriate due to severity of illness  Dispo: The patient is from: Home              Anticipated d/c is to: Home              Anticipated d/c date is: 1 day              Patient currently is not medically stable to d/c.  Consultants:   GI  Surgery  Palliative medicine  Procedures:     Antimicrobials:     Subjective: Patient reports she wants to go forward with tissue biopsy of breast mass and agreeable to SNF rehab.    Objective: Vitals:   12/12/20 1954 12/12/20 2007 12/13/20 0455 12/13/20 0738  BP: 138/62  (!) 150/78 (!) 163/65  Pulse: 98  92 87  Resp: 18  18 16   Temp: 98.6 F (37 C)  98.7 F (37.1 C) 98.1 F (36.7 C)  TempSrc: Oral  Oral Oral  SpO2: (!) 89% (!) 88% 97% 96%  Weight:      Height:        Intake/Output Summary (Last 24 hours) at 12/13/2020 1438 Last data filed at 12/13/2020 0500 Gross per 24  hour  Intake 240 ml  Output 200 ml  Net 40 ml   Filed Weights   12/09/20 1121 12/09/20 1904 12/10/20 0521  Weight: 86.2 kg 89.7 kg 85.1 kg    Examination:  General exam: Appears calm and comfortable. Appears pale.  Respiratory system: Clear to auscultation. Respiratory effort normal. Cardiovascular system: normal S1 & S2 heard. No JVD, murmurs, rubs, gallops or clicks. No pedal edema. Gastrointestinal system: Abdomen is nondistended, soft and nontender. No organomegaly or masses felt. Normal bowel sounds heard. Central nervous system: Alert and oriented. No focal neurological deficits. Extremities: severe bilateral  hip pain and discomfort, Symmetric 5 x 5 power. Skin: No rashes, lesions or ulcers.  Psychiatry: Mood & affect appropriate.   Data Reviewed: I have personally reviewed following labs and imaging studies  CBC: Recent Labs  Lab 12/09/20 1220 12/10/20 0710 12/11/20 1221 12/11/20 2325 12/12/20 0535 12/13/20 0522  WBC 10.4 9.1 12.2*  --  11.5* 10.8*  NEUTROABS 6.4  --   --   --   --   --   HGB 6.2* 7.4* 7.7* 8.8* 8.9* 8.7*  HCT 20.9* 25.1* 26.0* 29.4* 30.5* 29.3*  MCV 76.8* 78.9* 79.3*  --  82.0 81.8  PLT 419* 371 366  --  393 053    Basic Metabolic Panel: Recent Labs  Lab 12/09/20 1220  NA 137  K 3.7  CL 105  CO2 24  GLUCOSE 228*  BUN 19  CREATININE 0.79  CALCIUM 9.3    GFR: Estimated Creatinine Clearance: 67.8 mL/min (by C-G formula based on SCr of 0.79 mg/dL).  Liver Function Tests: Recent Labs  Lab 12/09/20 1220  AST 14*  ALT 12  ALKPHOS 83  BILITOT 0.3  PROT 6.8  ALBUMIN 3.5    CBG: No results for input(s): GLUCAP in the last 168 hours.  Recent Results (from the past 240 hour(s))  Resp Panel by RT-PCR (Flu A&B, Covid) Nasopharyngeal Swab     Status: None   Collection Time: 12/09/20 12:53 PM   Specimen: Nasopharyngeal Swab; Nasopharyngeal(NP) swabs in vial transport medium  Result Value Ref Range Status   SARS Coronavirus 2 by RT PCR NEGATIVE NEGATIVE Final    Comment: (NOTE) SARS-CoV-2 target nucleic acids are NOT DETECTED.  The SARS-CoV-2 RNA is generally detectable in upper respiratory specimens during the acute phase of infection. The lowest concentration of SARS-CoV-2 viral copies this assay can detect is 138 copies/mL. A negative result does not preclude SARS-Cov-2 infection and should not be used as the sole basis for treatment or other patient management decisions. A negative result may occur with  improper specimen collection/handling, submission of specimen other than nasopharyngeal swab, presence of viral mutation(s) within the areas  targeted by this assay, and inadequate number of viral copies(<138 copies/mL). A negative result must be combined with clinical observations, patient history, and epidemiological information. The expected result is Negative.  Fact Sheet for Patients:  EntrepreneurPulse.com.au  Fact Sheet for Healthcare Providers:  IncredibleEmployment.be  This test is no t yet approved or cleared by the [place] FDA and  has been authorized for detection and/or diagnosis of SARS-CoV-2 by FDA under an Emergency Use Authorization (EUA). This EUA will remain  in effect (meaning this test can be used) for the duration of the COVID-19 declaration under Section 564(b)(1) of the Act, 21 U.S.C.section 360bbb-3(b)(1), unless the authorization is terminated  or revoked sooner.       Influenza A by PCR NEGATIVE NEGATIVE Final   Influenza B  by PCR NEGATIVE NEGATIVE Final    Comment: (NOTE) The Xpert Xpress SARS-CoV-2/FLU/RSV plus assay is intended as an aid in the diagnosis of influenza from Nasopharyngeal swab specimens and should not be used as a sole basis for treatment. Nasal washings and aspirates are unacceptable for Xpert Xpress SARS-CoV-2/FLU/RSV testing.  Fact Sheet for Patients: EntrepreneurPulse.com.au  Fact Sheet for Healthcare Providers: IncredibleEmployment.be  This test is not yet approved or cleared by the [place] FDA and has been authorized for detection and/or diagnosis of SARS-CoV-2 by FDA under an Emergency Use Authorization (EUA). This EUA will remain in effect (meaning this test can be used) for the duration of the COVID-19 declaration under Section 564(b)(1) of the Act, 21 U.S.C. section 360bbb-3(b)(1), unless the authorization is terminated or revoked.  Performed at [facility], [address] [place], [place] [address]      Radiology Studies: No results found. Scheduled Meds: . sodium  chloride   Intravenous Once  . amLODipine  5 mg Oral Daily  . [START ON 12/14/2020] ferrous sulfate  325 mg Oral Q breakfast  . lisinopril  10 mg Oral Daily  . pantoprazole  40 mg Oral BID  . polyethylene glycol  17 g Oral Daily   Continuous Infusions: . lactated ringers       LOS: 4 days   Time spent: 35 minutes   Brittney Racanelli Wynetta Emery, MD How to contact the [facility] Attending or Consulting provider [place] or covering provider during after hours [place], for this patient?  1. Check the care team in [facility] and look for a) attending/consulting [facility] provider listed and b) the [facility] team listed 2. Log into [email or web address] and use [place]'s universal password to access. If you do not have the password, please contact the hospital operator. 3. Locate the [facility] provider you are looking for under [facility] and page to a number that you can be directly reached. 4. If you still have difficulty reaching the provider, please page the [facility] (Director on Call) for the Hospitalists listed on amion for assistance.  12/13/2020, 2:38 PM

## 2020-12-13 NOTE — TOC Progression Note (Signed)
Transition of Care [place]) - Progression Note    Patient Details  Name: BAYLI QUESINBERRY MRN: [id number] Date of Birth: 10/25/1946  [facility] CM/SW Contact  Ihor Gully, LCSW Phone Number: 12/13/2020, 3:34 PM  Clinical Narrative:    Staff at [facility] advised that patient's insurance is not in network. Patient advised.     Expected Discharge Plan: [place] Barriers to Discharge: Continued Medical Work up  Expected Discharge Plan and Services Expected Discharge Plan: [place] In-house Referral: NA   Post Acute Care Choice: NA Living arrangements for the past 2 months: Single Family Home                 DME Arranged: N/A DME Agency: NA       HH Arranged: NA HH Agency: NA         Social Determinants of Health (SDOH) Interventions    Readmission Risk Interventions Readmission Risk Prevention Plan 12/12/2020  Post Dischage Appt Complete  Medication Screening Complete  Transportation Screening Complete  Some recent data might be hidden

## 2020-12-14 ENCOUNTER — Other Ambulatory Visit: Payer: Self-pay

## 2020-12-14 ENCOUNTER — Telehealth: Payer: Self-pay | Admitting: Gastroenterology

## 2020-12-14 DIAGNOSIS — C50512 Malignant neoplasm of lower-outer quadrant of left female breast: Secondary | ICD-10-CM | POA: Diagnosis not present

## 2020-12-14 DIAGNOSIS — Z803 Family history of malignant neoplasm of breast: Secondary | ICD-10-CM | POA: Diagnosis not present

## 2020-12-14 DIAGNOSIS — K5900 Constipation, unspecified: Secondary | ICD-10-CM | POA: Diagnosis not present

## 2020-12-14 DIAGNOSIS — Z87891 Personal history of nicotine dependence: Secondary | ICD-10-CM | POA: Diagnosis not present

## 2020-12-14 DIAGNOSIS — N63 Unspecified lump in unspecified breast: Secondary | ICD-10-CM

## 2020-12-14 DIAGNOSIS — R110 Nausea: Secondary | ICD-10-CM | POA: Diagnosis not present

## 2020-12-14 DIAGNOSIS — K5790 Diverticulosis of intestine, part unspecified, without perforation or abscess without bleeding: Secondary | ICD-10-CM | POA: Diagnosis not present

## 2020-12-14 DIAGNOSIS — M25551 Pain in right hip: Secondary | ICD-10-CM | POA: Diagnosis not present

## 2020-12-14 DIAGNOSIS — M6281 Muscle weakness (generalized): Secondary | ICD-10-CM | POA: Diagnosis not present

## 2020-12-14 DIAGNOSIS — K922 Gastrointestinal hemorrhage, unspecified: Secondary | ICD-10-CM

## 2020-12-14 DIAGNOSIS — C50412 Malignant neoplasm of upper-outer quadrant of left female breast: Secondary | ICD-10-CM | POA: Diagnosis not present

## 2020-12-14 DIAGNOSIS — R42 Dizziness and giddiness: Secondary | ICD-10-CM | POA: Diagnosis not present

## 2020-12-14 DIAGNOSIS — R262 Difficulty in walking, not elsewhere classified: Secondary | ICD-10-CM | POA: Diagnosis not present

## 2020-12-14 DIAGNOSIS — Z7189 Other specified counseling: Secondary | ICD-10-CM | POA: Diagnosis not present

## 2020-12-14 DIAGNOSIS — M2550 Pain in unspecified joint: Secondary | ICD-10-CM | POA: Diagnosis not present

## 2020-12-14 DIAGNOSIS — K648 Other hemorrhoids: Secondary | ICD-10-CM | POA: Diagnosis not present

## 2020-12-14 DIAGNOSIS — K2901 Acute gastritis with bleeding: Secondary | ICD-10-CM

## 2020-12-14 DIAGNOSIS — Z170 Estrogen receptor positive status [ER+]: Secondary | ICD-10-CM | POA: Diagnosis not present

## 2020-12-14 DIAGNOSIS — U071 COVID-19: Secondary | ICD-10-CM | POA: Diagnosis not present

## 2020-12-14 DIAGNOSIS — D649 Anemia, unspecified: Secondary | ICD-10-CM | POA: Diagnosis not present

## 2020-12-14 DIAGNOSIS — R051 Acute cough: Secondary | ICD-10-CM | POA: Diagnosis not present

## 2020-12-14 DIAGNOSIS — I10 Essential (primary) hypertension: Secondary | ICD-10-CM | POA: Diagnosis not present

## 2020-12-14 DIAGNOSIS — I700 Atherosclerosis of aorta: Secondary | ICD-10-CM | POA: Diagnosis not present

## 2020-12-14 DIAGNOSIS — R109 Unspecified abdominal pain: Secondary | ICD-10-CM | POA: Diagnosis not present

## 2020-12-14 DIAGNOSIS — C50912 Malignant neoplasm of unspecified site of left female breast: Secondary | ICD-10-CM | POA: Diagnosis not present

## 2020-12-14 DIAGNOSIS — G479 Sleep disorder, unspecified: Secondary | ICD-10-CM | POA: Diagnosis not present

## 2020-12-14 DIAGNOSIS — R200 Anesthesia of skin: Secondary | ICD-10-CM | POA: Diagnosis not present

## 2020-12-14 DIAGNOSIS — M25552 Pain in left hip: Secondary | ICD-10-CM | POA: Diagnosis not present

## 2020-12-14 DIAGNOSIS — Z79899 Other long term (current) drug therapy: Secondary | ICD-10-CM | POA: Diagnosis not present

## 2020-12-14 DIAGNOSIS — M25559 Pain in unspecified hip: Secondary | ICD-10-CM | POA: Diagnosis not present

## 2020-12-14 DIAGNOSIS — K5731 Diverticulosis of large intestine without perforation or abscess with bleeding: Secondary | ICD-10-CM | POA: Diagnosis not present

## 2020-12-14 DIAGNOSIS — D500 Iron deficiency anemia secondary to blood loss (chronic): Secondary | ICD-10-CM | POA: Diagnosis not present

## 2020-12-14 LAB — CBC
HCT: 30.6 % — ABNORMAL LOW (ref 36.0–46.0)
Hemoglobin: 8.8 g/dL — ABNORMAL LOW (ref 12.0–15.0)
MCH: 23.8 pg — ABNORMAL LOW (ref 26.0–34.0)
MCHC: 28.8 g/dL — ABNORMAL LOW (ref 30.0–36.0)
MCV: 82.7 fL (ref 80.0–100.0)
Platelets: 398 10*3/uL (ref 150–400)
RBC: 3.7 MIL/uL — ABNORMAL LOW (ref 3.87–5.11)
RDW: 16.6 % — ABNORMAL HIGH (ref 11.5–15.5)
WBC: 10 10*3/uL (ref 4.0–10.5)
nRBC: 0 % (ref 0.0–0.2)

## 2020-12-14 LAB — SURGICAL PATHOLOGY

## 2020-12-14 MED ORDER — OXYCODONE HCL 5 MG PO TABS: 5.0000 mg | ORAL_TABLET | ORAL | 0 refills | Status: DC | PRN

## 2020-12-14 MED ORDER — FERROUS SULFATE 325 (65 FE) MG PO TABS: 325.0000 mg | ORAL_TABLET | Freq: Every day | ORAL | 3 refills | Status: DC

## 2020-12-14 MED ORDER — AMLODIPINE BESYLATE 5 MG PO TABS: 5.0000 mg | ORAL_TABLET | Freq: Every day | ORAL | Status: DC

## 2020-12-14 MED ORDER — PANTOPRAZOLE SODIUM 40 MG PO TBEC
40.0000 mg | DELAYED_RELEASE_TABLET | Freq: Two times a day (BID) | ORAL | Status: DC
  Administered 2020-12-14: 40 mg via ORAL
  Filled 2020-12-14: qty 1

## 2020-12-14 MED ORDER — POLYETHYLENE GLYCOL 3350 17 G PO PACK: 17.0000 g | PACK | Freq: Every day | ORAL | 0 refills | Status: DC

## 2020-12-14 MED ORDER — PANTOPRAZOLE SODIUM 40 MG PO TBEC: 40.0000 mg | DELAYED_RELEASE_TABLET | Freq: Two times a day (BID) | ORAL | Status: DC

## 2020-12-14 NOTE — Discharge Summary (Signed)
Physician Discharge Summary  Brittney Carroll OVZ:[id number] DOB: January 31, 1946 DOA: 12/09/2020  PCP: Celene Squibb, MD  Admit date: 12/09/2020 Discharge date: 12/14/2020  Admitted From: Home Disposition:  SNF  Recommendations for Outpatient Follow-up:  1. Follow up with PCP in 1-2 weeks 2. Please obtain BMP/CBC in one week 3. Please take patient to oncology appointment with Dr. Delton Coombes on 12/29/20 at [place]    Discharge Condition: Stable CODE STATUS: DNR Diet recommendation: Heart Healthy   Brief/Interim Summary: 74 year old female with left breast cancer diverticulosis recurrent rectal bleeding chronic pelvic pain chronic hip pain presented to the emergency department complaining of 2 to 3 days of bright red blood per rectum and associated with weakness and abnormal labs from PCP reported as hemoglobin less than 7.  Unfortunately she has been treating her chronic hip pain with aspirin every 4 hours because she did not have transportation to continue to see her pain management specialist and has no longer been taking opioids for pain relief.  She had not sought diagnosis or treatment of the left breast mass and at this time has decided not to pursue further treatment  Discharge Diagnoses:  1. Recurrent GI bleeding--presented with Hgb 6.2.  Iron studies consistent with IDA and she was started on po ferrous sulfate. She received 2 units PRBC during the hospitalization. GI consultation requested and planning.  EGD/colonoscopy on 12/27 completed this admission with gastritis, non-bleeding duodenal ulcers, duodenitis, colonoscopy with pancolonic diverticula, clotted blood in transverse colon without active bleeding but prep not ideal Colonoscopy with findings of old blood in transverse colon,  Internal hemorrhoids, diverticula.  Patient is adamant she does not want any intra-abdominal surgeries.  Pt advised to avoid NSAIDs.   GI will arrange for outpatient followup  2. Left breast cancer-I asked for  a surgery consult to see if patient would be willing to have a biopsy done while she is in the hospital unfortunately she had initially declined any further work-up or treatment.  On 12/27 she changed her mind and wanted to pursue workup.  I contacted IR and was told that patient needed to follow the breast mass protocol starting with mammogram first.  [facility].   I have asked for palliative medicine consultation for goals of care discussions and patient changed to DNR.  Appointment set up at [facility] for 12/29/20 at 8AM 3. Chronic hip pain-suspect avascular necrosis of hips.  I am restarting opioid therapy for pain management.  D/c with oxycodone 5 mg prn pain 4. Essential hypertension-monitor blood pressure and treating with home olmesartain, added amlodipine.    Discharge Instructions  Discharge Instructions    Ambulatory referral to Breast Clinic   Complete by: As directed    Ambulatory referral to Hematology / Oncology   Complete by: As directed      Allergies as of 12/14/2020      Reactions   Atenolol Other (See Comments)   Makes BP drop suddenly and feels lifeless      Medication List    STOP taking these medications   lisinopril 10 MG tablet Commonly known as: ZESTRIL     TAKE these medications   amLODipine 5 MG tablet Commonly known as: NORVASC Take 1 tablet (5 mg total) by mouth daily. Start taking on: December 15, 2020   ferrous sulfate 325 (65 FE) MG tablet Take 1 tablet (325 mg total) by mouth daily with breakfast. Start taking on: December 15, 2020   olmesartan 40 MG tablet Commonly known as: BENICAR Take 40 mg by mouth daily.   oxyCODONE 5 MG immediate release tablet Commonly known as: Oxy IR/ROXICODONE Take 1 tablet (5 mg total) by mouth every 3 (three) hours as needed for moderate pain.   pantoprazole 40 MG tablet Commonly known as: PROTONIX Take 1 tablet (40 mg total) by mouth 2  (two) times daily before a meal.   polyethylene glycol 17 g packet Commonly known as: MIRALAX / GLYCOLAX Take 17 g by mouth daily. Start taking on: December 15, 2020       Contact information for follow-up providers    Celene Squibb, MD. Schedule an appointment as soon as possible for a visit in 2 week(s).   Specialty: Internal Medicine Contact information: [address] [place] [address] [phone]        Derek Jack, MD On 12/29/2020.   Specialty: Hematology Why: at 8:00 am. Patient can be accompanied by only one person. Contact information: [place] [address] [phone]            Contact information for after-discharge care    [facility] .   Service: Skilled Nursing Contact information: [place] [phone]                 Allergies  Allergen Reactions  . Atenolol Other (See Comments)    Makes BP drop suddenly and feels lifeless    Consultations:  General surgery  IR   Procedures/Studies: CT Abdomen Pelvis W Contrast  Result Date: 12/09/2020 CLINICAL DATA:  Abdominal pain. Nonlocalized. Low hemoglobin. History of vaginal cancer breast cancer. Hysterectomy and appendectomy EXAM: CT ABDOMEN AND PELVIS WITH CONTRAST TECHNIQUE: Multidetector CT imaging of the abdomen and pelvis was performed using the standard protocol following bolus administration of intravenous contrast. CONTRAST:  100 mL Omnipaque COMPARISON:  CT abdomen 04/24/2007 FINDINGS: Lower chest: Lung bases are clear. Large mass within the LEFT breast measures 4.6 x 6.2 cm. Hepatobiliary: No focal hepatic lesion. No biliary duct dilatation. Common bile duct is normal. Pancreas: Pancreas is normal. No ductal dilatation. No pancreatic inflammation. Spleen: Normal spleen Adrenals/urinary tract: Adrenal glands normal. There is cortical thinning and scarring in the medial  LEFT kidney. No hydronephrosis. Ureters and bladder normal. Stomach/Bowel: Stomach, duodenum small-bowel normal. There multiple diverticula of the ascending, transverse and descending colon without acute inflammation. Rectum normal. No bowel obstruction or inflammation. Vascular/Lymphatic: Abdominal aorta is normal caliber with atherosclerotic calcification. There is no retroperitoneal or periportal lymphadenopathy. No pelvic lymphadenopathy. Reproductive: Post hysterectomy.  Adnexa unremarkable Other: No free fluid. Musculoskeletal: No aggressive osseous lesion. IMPRESSION: 1. No acute findings in the abdomen pelvis. 2. Large 6 cm LEFT breast mass consistent with breast carcinoma. 3. Extensive pancolonic diverticulosis without evidence acute diverticulitis. 4.  Aortic Atherosclerosis (ICD10-I70.0). Electronically Signed   By: Suzy Bouchard M.D.   On: 12/09/2020 15:23         Discharge Exam: Vitals:   12/14/20 0454 12/14/20 1055  BP: (!) 144/94   Pulse: 85 (!) 105  Resp: 19   Temp: 98.6 F (37 C)   SpO2: 95% 98%   Vitals:   12/13/20 1438 12/13/20 2114 12/14/20 0454 12/14/20 1055  BP: (!) 143/62 (!) 149/62 (!) 144/94   Pulse: 91 85 85 (!) 105  Resp: 20 17 19    Temp: 98.3 F (36.8 C) 97.7 F (36.5 C)  98.6 F (37 C)   TempSrc: Oral  Oral   SpO2: 92% 94% 95% 98%  Weight:      Height:        General: Pt is alert, awake, not in acute distress Cardiovascular: RRR, S1/S2 +, no rubs, no gallops Respiratory: CTA bilaterally, no wheezing, no rhonchi Abdominal: Soft, NT, ND, bowel sounds + Extremities: no edema, no cyanosis   The results of significant diagnostics from this hospitalization (including imaging, microbiology, ancillary and laboratory) are listed below for reference.    Significant Diagnostic Studies: CT Abdomen Pelvis W Contrast  Result Date: 12/09/2020 CLINICAL DATA:  Abdominal pain. Nonlocalized. Low hemoglobin. History of vaginal cancer breast cancer.  Hysterectomy and appendectomy EXAM: CT ABDOMEN AND PELVIS WITH CONTRAST TECHNIQUE: Multidetector CT imaging of the abdomen and pelvis was performed using the standard protocol following bolus administration of intravenous contrast. CONTRAST:  100 mL Omnipaque COMPARISON:  CT abdomen 04/24/2007 FINDINGS: Lower chest: Lung bases are clear. Large mass within the LEFT breast measures 4.6 x 6.2 cm. Hepatobiliary: No focal hepatic lesion. No biliary duct dilatation. Common bile duct is normal. Pancreas: Pancreas is normal. No ductal dilatation. No pancreatic inflammation. Spleen: Normal spleen Adrenals/urinary tract: Adrenal glands normal. There is cortical thinning and scarring in the medial LEFT kidney. No hydronephrosis. Ureters and bladder normal. Stomach/Bowel: Stomach, duodenum small-bowel normal. There multiple diverticula of the ascending, transverse and descending colon without acute inflammation. Rectum normal. No bowel obstruction or inflammation. Vascular/Lymphatic: Abdominal aorta is normal caliber with atherosclerotic calcification. There is no retroperitoneal or periportal lymphadenopathy. No pelvic lymphadenopathy. Reproductive: Post hysterectomy.  Adnexa unremarkable Other: No free fluid. Musculoskeletal: No aggressive osseous lesion. IMPRESSION: 1. No acute findings in the abdomen pelvis. 2. Large 6 cm LEFT breast mass consistent with breast carcinoma. 3. Extensive pancolonic diverticulosis without evidence acute diverticulitis. 4.  Aortic Atherosclerosis (ICD10-I70.0). Electronically Signed   By: Suzy Bouchard M.D.   On: 12/09/2020 15:23     Microbiology: Recent Results (from the past 240 hour(s))  Resp Panel by RT-PCR (Flu A&B, Covid) Nasopharyngeal Swab     Status: None   Collection Time: 12/09/20 12:53 PM   Specimen: Nasopharyngeal Swab; Nasopharyngeal(NP) swabs in vial transport medium  Result Value Ref Range Status   SARS Coronavirus 2 by RT PCR NEGATIVE NEGATIVE Final    Comment:  (NOTE) SARS-CoV-2 target nucleic acids are NOT DETECTED.  The SARS-CoV-2 RNA is generally detectable in upper respiratory specimens during the acute phase of infection. The lowest concentration of SARS-CoV-2 viral copies this assay can detect is 138 copies/mL. A negative result does not preclude SARS-Cov-2 infection and should not be used as the sole basis for treatment or other patient management decisions. A negative result may occur with  improper specimen collection/handling, submission of specimen other than nasopharyngeal swab, presence of viral mutation(s) within the areas targeted by this assay, and inadequate number of viral copies(<138 copies/mL). A negative result must be combined with clinical observations, patient history, and epidemiological information. The expected result is Negative.  Fact Sheet for Patients:  EntrepreneurPulse.com.au  Fact Sheet for Healthcare Providers:  IncredibleEmployment.be  This test is no t yet approved or cleared by the [place] FDA and  has been authorized for detection and/or diagnosis of SARS-CoV-2 by FDA under an Emergency Use Authorization (EUA). This EUA will remain  in effect (meaning this test can be used) for the duration of the COVID-19 declaration under Section 564(b)(1) of the Act, 21 U.S.C.section 360bbb-3(b)(1), unless the authorization is  terminated  or revoked sooner.       Influenza A by PCR NEGATIVE NEGATIVE Final   Influenza B by PCR NEGATIVE NEGATIVE Final    Comment: (NOTE) The Xpert Xpress SARS-CoV-2/FLU/RSV plus assay is intended as an aid in the diagnosis of influenza from Nasopharyngeal swab specimens and should not be used as a sole basis for treatment. Nasal washings and aspirates are unacceptable for Xpert Xpress SARS-CoV-2/FLU/RSV testing.  Fact Sheet for Patients: EntrepreneurPulse.com.au  Fact Sheet for Healthcare  Providers: IncredibleEmployment.be  This test is not yet approved or cleared by the [place] FDA and has been authorized for detection and/or diagnosis of SARS-CoV-2 by FDA under an Emergency Use Authorization (EUA). This EUA will remain in effect (meaning this test can be used) for the duration of the COVID-19 declaration under Section 564(b)(1) of the Act, 21 U.S.C. section 360bbb-3(b)(1), unless the authorization is terminated or revoked.  Performed at [facility], [address], [place], [place] [address]   SARS CORONAVIRUS 2 (Nisreen Guise 6-24 HRS) Nasopharyngeal Nasopharyngeal Swab     Status: None   Collection Time: 12/13/20 11:51 AM   Specimen: Nasopharyngeal Swab  Result Value Ref Range Status   SARS Coronavirus 2 NEGATIVE NEGATIVE Final    Comment: (NOTE) SARS-CoV-2 target nucleic acids are NOT DETECTED.  The SARS-CoV-2 RNA is generally detectable in upper and lower respiratory specimens during the acute phase of infection. Negative results do not preclude SARS-CoV-2 infection, do not rule out co-infections with other pathogens, and should not be used as the sole basis for treatment or other patient management decisions. Negative results must be combined with clinical observations, patient history, and epidemiological information. The expected result is Negative.  Fact Sheet for Patients: SugarRoll.be  Fact Sheet for Healthcare Providers: https://www.woods-mathews.com/  This test is not yet approved or cleared by the [place] FDA and  has been authorized for detection and/or diagnosis of SARS-CoV-2 by FDA under an Emergency Use Authorization (EUA). This EUA will remain  in effect (meaning this test can be used) for the duration of the COVID-19 declaration under Se ction 564(b)(1) of the Act, 21 U.S.C. section 360bbb-3(b)(1), unless the authorization is terminated or revoked sooner.  Performed at [facility], [place] [address] [place], [place] [address]      Labs: Basic Metabolic Panel: Recent Labs  Lab 12/09/20 1220  NA 137  K 3.7  CL 105  CO2 24  GLUCOSE 228*  BUN 19  CREATININE 0.79  CALCIUM 9.3   Liver Function Tests: Recent Labs  Lab 12/09/20 1220  AST 14*  ALT 12  ALKPHOS 83  BILITOT 0.3  PROT 6.8  ALBUMIN 3.5   No results for input(s): LIPASE, AMYLASE in the last 168 hours. No results for input(s): AMMONIA in the last 168 hours. CBC: Recent Labs  Lab 12/09/20 1220 12/10/20 0710 12/11/20 1221 12/11/20 2325 12/12/20 0535 12/13/20 0522 12/14/20 0455  WBC 10.4 9.1 12.2*  --  11.5* 10.8* 10.0  NEUTROABS 6.4  --   --   --   --   --   --   HGB 6.2* 7.4* 7.7* 8.8* 8.9* 8.7* 8.8*  HCT 20.9* 25.1* 26.0* 29.4* 30.5* 29.3* 30.6*  MCV 76.8* 78.9* 79.3*  --  82.0 81.8 82.7  PLT 419* 371 366  --  393 365 398   Cardiac Enzymes: No results for input(s): CKTOTAL, CKMB, CKMBINDEX, TROPONINI in the last 168 hours. BNP: Invalid input(s): POCBNP CBG: No results for input(s): GLUCAP  in the last 168 hours.  Time coordinating discharge:  36 minutes  Signed:  Orson Eva, DO [facility] Pager: [phone] 12/14/2020, 11:26 AM

## 2020-12-14 NOTE — Progress Notes (Signed)
Physical Therapy Treatment Patient Details Name: Brittney Carroll MRN: [id number] DOB: 10-15-1946 Today's Date: 12/14/2020    History of Present Illness Brittney Carroll is a 74 y.o. female with medical history significant of diverticulosis, HTN, left breast CA, presented with recurrent rectal bleeding and feeling weakness. Patient has had multiple rectal bleeding since summer 2021, usually last for 2 to 3 days with, frank red blood per rectum large quantity and then resolved by its own. No abdominal pain. Rectal bleeding issue tend to come back every once a while, as her estimation once in between 2 to 4 weeks, she did not seek any medical advice until last week she went to see her PCP, and today's blood work showed hemoglobin less than 7.    PT Comments    Pt pleasant and willing to participate with therapy today.  Pt limited by pain Bil hips, was pre-medicated prior therapy for pain control.  Pt limited by pain and weakness required increased time and some assistance with bed mobility and transfers.  Pt manual moved LE to EOB with UE, required mod A with LE back to bed at EOS.  Tolerated well with seated LE strengthening exercises, min cueing to improve control and full ROM with movements for maximal strengthening benefits.  Pt c/o back itchy during seated exercises, therapist examined back with rash noted.  Added lotion to address itch with reports of relief, RN aware.  Decreased distance with gait training this session due to fatigue and pain.  Min A and no LOB during gait training with use of RW.  EOS pt left in bed with call bell within reach and RN entered room.  Follow Up Recommendations  SNF     Equipment Recommendations  None recommended by PT    Recommendations for Other Services       Precautions / Restrictions Precautions Precautions: Fall    Mobility  Bed Mobility Overal bed mobility: Modified Independent Bed Mobility: Supine to Sit     Supine to sit: Min guard;Min  assist Sit to supine: Mod assist   General bed mobility comments: slow labored movements with UE moving Bil LE to EOB, required mod A sit to supine; limited by extreme weakness and pain with movement  Transfers Overall transfer level: Needs assistance Equipment used: Rolling walker (2 wheeled)   Sit to Stand: Min assist;From elevated surface         General transfer comment: elevated bed height, cueing to push from bed vs pull on walker, increased time, labored movements  Ambulation/Gait Ambulation/Gait assistance: Min assist Gait Distance (Feet): 16 Feet Assistive device: Rolling walker (2 wheeled) Gait Pattern/deviations: Decreased step length - right;Decreased step length - left;Decreased stride length Gait velocity: decreased   General Gait Details: slow labored movement limited by pain and c/o fatigue   Stairs             Wheelchair Mobility    Modified Rankin (Stroke Patients Only)       Balance                                            Cognition Arousal/Alertness: Awake/alert Behavior During Therapy: WFL for tasks assessed/performed Overall Cognitive Status: Within Functional Limits for tasks assessed  Exercises General Exercises - Lower Extremity Ankle Circles/Pumps: AROM;Both;10 reps;Seated Long Arc Quad: Strengthening;Both;10 reps;Seated Hip Flexion/Marching: AROM;Strengthening;Both;5 reps;Seated    General Comments        Pertinent Vitals/Pain Pain Assessment: 0-10 Pain Score: 8  Pain Location: bilateral hips Pain Descriptors / Indicators: Sore;Grimacing;Guarding Pain Intervention(s): Premedicated before session;Limited activity within patient's tolerance;Monitored during session;Repositioned    Home Living                      Prior Function            PT Goals (current goals can now be found in the care plan section)      Frequency    Min  3X/week      PT Plan Current plan remains appropriate    Co-evaluation              AM-PAC PT "6 Clicks" Mobility   Outcome Measure  Help needed turning from your back to your side while in a flat bed without using bedrails?: A Little Help needed moving from lying on your back to sitting on the side of a flat bed without using bedrails?: A Lot Help needed moving to and from a bed to a chair (including a wheelchair)?: A Lot Help needed standing up from a chair using your arms (e.g., wheelchair or bedside chair)?: A Lot Help needed to walk in hospital room?: A Lot Help needed climbing 3-5 steps with a railing? : A Lot 6 Click Score: 13    End of Session Equipment Utilized During Treatment: Gait belt Activity Tolerance: Patient tolerated treatment well;Patient limited by pain;Patient limited by fatigue Patient left: in bed;with call bell/phone within reach;with nursing/sitter in room Nurse Communication: Mobility status PT Visit Diagnosis: Unsteadiness on feet (R26.81);Other abnormalities of gait and mobility (R26.89);Muscle weakness (generalized) (M62.81)     Time: 1030-1103 PT Time Calculation (min) (ACUTE ONLY): 33 min  Charges:  $Therapeutic Exercise: 8-22 mins $Therapeutic Activity: 8-22 mins                     [name], LPTA/CLT; CBIS [phone]   [name] 12/14/2020, 11:02 AM

## 2020-12-14 NOTE — Progress Notes (Signed)
° ° °  Subjective: Notes chronic bilateral groin/joint pain. Feels tired. No N/V. Appetite is fair but not great. No overt GI bleeding. No BM since 12/27.   Objective: Vital signs in last 24 hours: Temp:  [97.7 F (36.5 C)-98.6 F (37 C)] 98.6 F (37 C) (12/29 0454) Pulse Rate:  [85-91] 85 (12/29 0454) Resp:  [17-20] 19 (12/29 0454) BP: (143-149)/(62-94) 144/94 (12/29 0454) SpO2:  [92 %-95 %] 95 % (12/29 0454) Last BM Date: 12/12/20 General:   Alert and oriented, pleasant Head:  Normocephalic and atraumatic. Abdomen:  Bowel sounds present, soft, non-tender, non-distended.  Neurologic:  Alert and  oriented x4 Psych: Normal mood and affect.  Intake/Output from previous day: 12/28 0701 - 12/29 0700 In: -  Out: 900 [Urine:900] Intake/Output this shift: No intake/output data recorded.  Lab Results: Recent Labs    12/12/20 0535 12/13/20 0522 12/14/20 0455  WBC 11.5* 10.8* 10.0  HGB 8.9* 8.7* 8.8*  HCT 30.5* 29.3* 30.6*  PLT 393 365 398    Assessment: 74 year old female with profound anemia, iron studies consistent with IDA, and reports of melena and rectal bleeding,  Hgb 6.2 on admission, s/p 2 units PRBCs with improvement in Hgb appropriately and remaining stable with most recent Hgb 8.8 this morning. EGD/colonoscopy completed this admission with gastritis, non-bleeding duodenal ulcers, duodenitis, colonoscopy with pancolonic diverticula, clotted blood in transverse colon without active bleeding but prep not ideal. Notably, she had been taking aspirin consistently prior to admission for severe arthritic pain.   Constipation: mild. Miralax started yesterday. May need to titrate this as appropriate.   Left breast mass on CT: per documentation, patient has declined further work-up.   No overt GI bleeding and stable from a GI standpoint. Will sign off for now but arrange outpatient close hospital follow-up.   Plan: Will follow-up on path as comes available Change Protonix to  before meals BID Miralax daily; titrate as needed Avoid NSAIDs Iron daily Consider early interval outpatient colonoscopy due to prep Will arrange outpatient follow-up Will sign off; please call with questions or concerns   Annitta Needs, PhD, ANP-BC [facility]     LOS: 5 days    12/14/2020, 8:40 AM

## 2020-12-14 NOTE — TOC Transition Note (Signed)
Transition of Care [facility]) - CM/SW Discharge Note   Patient Details  Name: Brittney Carroll MRN: [id number] Date of Birth: 10-May-1946  [facility]) CM/SW Contact:  Salome Arnt, LCSW Phone Number: 12/14/2020, 1:10 PM   Clinical Narrative:  LCSW discussed SNF bed offer at [facility] with pt this morning and she accepts. Authorization received per admissions at [facility]. COVID negative 12/13/20. Pt's son aware of 14 day quarantine at [facility]. D/C clinicals sent to [facility]. Discussed transportation with pt who is aware that insurance has denied EMS. She has arranged for her son to pick her up this afternoon. RN given number to call report. Pt will go to room B18-1.      Final next level of care: Skilled Nursing Facility Barriers to Discharge: Barriers Resolved   Patient Goals and CMS Choice Patient states their goals for this hospitalization and ongoing recovery are:: SNF CMS Medicare.gov Compare Post Acute Care list provided to:: Patient Choice offered to / list presented to : Patient  Discharge Placement              Patient chooses bed at: Other - please specify in the comment section below: ([facility]) Patient to be transferred to facility by: son Name of family member notified: Hollice Espy- son Patient and family notified of of transfer: 12/14/20  Discharge Plan and Services In-house Referral: NA   Post Acute Care Choice: NA          DME Arranged: N/A DME Agency: NA       HH Arranged: NA HH Agency: NA        Social Determinants of Health (SDOH) Interventions     Readmission Risk Interventions Readmission Risk Prevention Plan 12/12/2020  Post Dischage Appt Complete  Medication Screening Complete  Transportation Screening Complete  Some recent data might be hidden

## 2020-12-14 NOTE — Telephone Encounter (Signed)
Forwarded to [employer]

## 2020-12-14 NOTE — Progress Notes (Signed)
Patient given discharge package to give to SNF

## 2020-12-14 NOTE — Plan of Care (Signed)

## 2020-12-14 NOTE — Telephone Encounter (Signed)
Brittney Carroll,  Patient is inpatient. Please arrange hospital follow-up with Dr. Laural Golden. Thanks!

## 2020-12-14 NOTE — Progress Notes (Signed)
Patient discharged to [facility] called and given to [facility]. Vital signs stable. Family to transport patient to awaiting facility.

## 2020-12-15 DIAGNOSIS — I10 Essential (primary) hypertension: Secondary | ICD-10-CM | POA: Diagnosis not present

## 2020-12-15 DIAGNOSIS — K5731 Diverticulosis of large intestine without perforation or abscess with bleeding: Secondary | ICD-10-CM | POA: Diagnosis not present

## 2020-12-15 DIAGNOSIS — D649 Anemia, unspecified: Secondary | ICD-10-CM | POA: Diagnosis not present

## 2020-12-15 DIAGNOSIS — C50512 Malignant neoplasm of lower-outer quadrant of left female breast: Secondary | ICD-10-CM | POA: Diagnosis not present

## 2020-12-15 DIAGNOSIS — K922 Gastrointestinal hemorrhage, unspecified: Secondary | ICD-10-CM | POA: Diagnosis not present

## 2020-12-15 DIAGNOSIS — M25559 Pain in unspecified hip: Secondary | ICD-10-CM | POA: Diagnosis not present

## 2020-12-15 DIAGNOSIS — Z170 Estrogen receptor positive status [ER+]: Secondary | ICD-10-CM | POA: Diagnosis not present

## 2020-12-19 ENCOUNTER — Encounter (HOSPITAL_COMMUNITY): Payer: Self-pay | Admitting: Internal Medicine

## 2020-12-20 DIAGNOSIS — K922 Gastrointestinal hemorrhage, unspecified: Secondary | ICD-10-CM | POA: Diagnosis not present

## 2020-12-20 DIAGNOSIS — D649 Anemia, unspecified: Secondary | ICD-10-CM | POA: Diagnosis not present

## 2020-12-23 DIAGNOSIS — M25552 Pain in left hip: Secondary | ICD-10-CM | POA: Diagnosis not present

## 2020-12-23 DIAGNOSIS — M25551 Pain in right hip: Secondary | ICD-10-CM | POA: Diagnosis not present

## 2020-12-28 ENCOUNTER — Other Ambulatory Visit (HOSPITAL_COMMUNITY): Payer: Self-pay | Admitting: Internal Medicine

## 2020-12-28 DIAGNOSIS — N63 Unspecified lump in unspecified breast: Secondary | ICD-10-CM

## 2020-12-28 NOTE — Progress Notes (Signed)
[facility] [address], [place] [address]   CLINIC:  Medical Oncology/Hematology  CONSULT NOTE  Patient Care Team: Celene Squibb, MD as PCP - General (Internal Medicine)  CHIEF COMPLAINTS/PURPOSE OF CONSULTATION:  Evaluation of left breast cancer  HISTORY OF PRESENTING ILLNESS:  [name] 75 y.o. female is here because of evaluation of left breast cancer, at the request of Dr. Shanon Brow Tat from [facility]. She came to [facility] on 12/09/2020 after having rectal bleeding and also mentioned the enlarging left breast mass.  She was hospitalized from 12/09/2020 through 12/14/2020 as she had bleeding per rectum.  Hemoglobin at presentation was 6.2.  Iron study showed iron deficiency anemia.  She received 2 units of PRBC during hospitalization.  She had EGD and colonoscopy on 12/12/2020 showing gastritis, nonbleeding duodenal ulcer, duodenitis, diverticulosis, biopsies were benign.  She apparently had breast mass and was offered a surgical consult while inpatient.  Patient refused it.  Today she reports that she has been receiving rehab at [place] facility since discharge from the hospital.  She lives by herself at home.  She does not drive.  She sleeps in a recliner and walks with the help of walker.  She is able to do her day-to-day activities.  She usually has groceries delivered to her house.  She worked for [employer] and also worked as a Quarry manager.  She quit smoking 50 years ago.  Family history significant for sister who died of breast cancer in her 21s.  MEDICAL HISTORY:  Past Medical History:  Diagnosis Date  . Arthritis   . Breast mass, left   . Cancer ([place])    vaginal 2008  . Diabetes mellitus without complication ([place])   . Hyperlipidemia   . Hypertension     SURGICAL HISTORY: Past Surgical History:  Procedure Laterality Date  . ABDOMINAL HYSTERECTOMY    . APPENDECTOMY    . BIOPSY  12/12/2020   Procedure: BIOPSY;  Surgeon: Eloise Harman, DO;  [facility];  Service: Endoscopy;;  . COLONOSCOPY WITH PROPOFOL N/A 12/12/2020   Procedure: COLONOSCOPY WITH PROPOFOL;  Surgeon: Eloise Harman, DO;  [facility];  Service: Endoscopy;  Laterality: N/A;  . ESOPHAGOGASTRODUODENOSCOPY (EGD) WITH PROPOFOL N/A 12/12/2020   Procedure: ESOPHAGOGASTRODUODENOSCOPY (EGD) WITH PROPOFOL;  Surgeon: Eloise Harman, DO;  [facility];  Service: Endoscopy;  Laterality: N/A;  . TONSILLECTOMY      SOCIAL HISTORY: Social History   Socioeconomic History  . Marital status: Divorced    Spouse name: Not on file  . Number of children: Not on file  . Years of education: Not on file  . Highest education level: Not on file  Occupational History  . Not on file  Tobacco Use  . Smoking status: Former Smoker    Packs/day: 1.50    Years: 7.00    Pack years: 10.50    Types: Cigarettes  . Smokeless tobacco: Never Used  . Tobacco comment: quit 50 yrs ago  Vaping Use  . Vaping Use: Never used  Substance and Sexual Activity  . Alcohol use: Not Currently  . Drug use: Never  . Sexual activity: Not on file  Other Topics Concern  . Not on file  Social History Narrative  . Not on file   Social Determinants of Health   Financial Resource Strain: Medium Risk  . Difficulty of Paying Living Expenses: Somewhat hard  Food Insecurity: No Food Insecurity  .  Worried About Charity fundraiser in the Last Year: Never true  . Ran Out of Food in the Last Year: Never true  Transportation Needs: Unmet Transportation Needs  . Lack of Transportation (Medical): Yes  . Lack of Transportation (Non-Medical): Yes  Physical Activity: Inactive  . Days of Exercise per Week: 0 days  . Minutes of Exercise per Session: 0 min  Stress: Stress Concern Present  . Feeling of Stress : To some extent  Social Connections: Socially Isolated  . Frequency of Communication with Friends and Family: Once a week  . Frequency of Social Gatherings with Friends and  Family: Once a week  . Attends Religious Services: 1 to 4 times per year  . Active Member of Clubs or Organizations: No  . Attends Archivist Meetings: Never  . Marital Status: Divorced  Human resources officer Violence: Not At Risk  . Fear of Current or Ex-Partner: No  . Emotionally Abused: No  . Physically Abused: No  . Sexually Abused: No    FAMILY HISTORY: Family History  Problem Relation Age of Onset  . Breast cancer Sister        Deceased in her 6s    ALLERGIES:  is allergic to atenolol.  MEDICATIONS:  Current Outpatient Medications  Medication Sig Dispense Refill  . amLODipine (NORVASC) 5 MG tablet Take 1 tablet (5 mg total) by mouth daily.    . ferrous sulfate 325 (65 FE) MG tablet Take 1 tablet (325 mg total) by mouth daily with breakfast.  3  . olmesartan (BENICAR) 40 MG tablet Take 40 mg by mouth daily.    Marland Kitchen oxyCODONE (OXY IR/ROXICODONE) 5 MG immediate release tablet Take 1 tablet (5 mg total) by mouth every 3 (three) hours as needed for moderate pain. 15 tablet 0  . pantoprazole (PROTONIX) 40 MG tablet Take 1 tablet (40 mg total) by mouth 2 (two) times daily before a meal.    . polyethylene glycol (MIRALAX / GLYCOLAX) 17 g packet Take 17 g by mouth daily. 14 each 0   No current facility-administered medications for this visit.    REVIEW OF SYSTEMS:   Review of Systems  Gastrointestinal: Positive for constipation and nausea.  Musculoskeletal: Positive for arthralgias.  Neurological: Positive for dizziness and numbness.  Psychiatric/Behavioral: Positive for sleep disturbance.  All other systems reviewed and are negative.    PHYSICAL EXAMINATION: ECOG PERFORMANCE STATUS: 2 - Symptomatic, <50% confined to bed  Vitals:   12/29/20 0814  BP: 115/67  Pulse: (!) 118  Resp: 17  Temp: 97.6 F (36.4 C)  SpO2: 98%   Filed Weights   12/29/20 0814  Weight: 195 lb (88.5 kg)   Physical Exam Vitals reviewed.  Constitutional:      Appearance: Normal  appearance.  Cardiovascular:     Rate and Rhythm: Regular rhythm.     Heart sounds: Normal heart sounds.  Pulmonary:     Effort: Pulmonary effort is normal.     Breath sounds: Normal breath sounds.  Abdominal:     Palpations: Abdomen is soft.  Musculoskeletal:        General: No swelling.  Skin:    General: Skin is warm.  Neurological:     Mental Status: She is alert and oriented to person, place, and time.  Psychiatric:        Mood and Affect: Mood normal.        Behavior: Behavior normal.   Left breast has mass in the upper outer quadrant also  involving the whole of the upper quadrant, freely mobile.  There is prominence in the left axillary tail region.  No supraclavicular adenopathy.        LABORATORY DATA:  I have reviewed the data as listed CBC Latest Ref Rng & Units 12/14/2020 12/13/2020 12/12/2020  WBC 4.0 - 10.5 K/uL 10.0 10.8(H) 11.5(H)  Hemoglobin 12.0 - 15.0 g/dL 8.8(L) 8.7(L) 8.9(L)  Hematocrit 36.0 - 46.0 % 30.6(L) 29.3(L) 30.5(L)  Platelets 150 - 400 K/uL 398 365 393   CMP Latest Ref Rng & Units 12/09/2020  Glucose 70 - 99 mg/dL 228(H)  BUN 8 - 23 mg/dL 19  Creatinine 0.44 - 1.00 mg/dL 0.79  Sodium 135 - 145 mmol/L 137  Potassium 3.5 - 5.1 mmol/L 3.7  Chloride 98 - 111 mmol/L 105  CO2 22 - 32 mmol/L 24  Calcium 8.9 - 10.3 mg/dL 9.3  Total Protein 6.5 - 8.1 g/dL 6.8  Total Bilirubin 0.3 - 1.2 mg/dL 0.3  Alkaline Phos 38 - 126 U/L 83  AST 15 - 41 U/L 14(L)  ALT 0 - 44 U/L 12    RADIOGRAPHIC STUDIES: I have personally reviewed the radiological images as listed and agreed with the findings in the report. CT Abdomen Pelvis W Contrast  Result Date: 12/09/2020 CLINICAL DATA:  Abdominal pain. Nonlocalized. Low hemoglobin. History of vaginal cancer breast cancer. Hysterectomy and appendectomy EXAM: CT ABDOMEN AND PELVIS WITH CONTRAST TECHNIQUE: Multidetector CT imaging of the abdomen and pelvis was performed using the standard protocol following bolus  administration of intravenous contrast. CONTRAST:  100 mL Omnipaque COMPARISON:  CT abdomen 04/24/2007 FINDINGS: Lower chest: Lung bases are clear. Large mass within the LEFT breast measures 4.6 x 6.2 cm. Hepatobiliary: No focal hepatic lesion. No biliary duct dilatation. Common bile duct is normal. Pancreas: Pancreas is normal. No ductal dilatation. No pancreatic inflammation. Spleen: Normal spleen Adrenals/urinary tract: Adrenal glands normal. There is cortical thinning and scarring in the medial LEFT kidney. No hydronephrosis. Ureters and bladder normal. Stomach/Bowel: Stomach, duodenum small-bowel normal. There multiple diverticula of the ascending, transverse and descending colon without acute inflammation. Rectum normal. No bowel obstruction or inflammation. Vascular/Lymphatic: Abdominal aorta is normal caliber with atherosclerotic calcification. There is no retroperitoneal or periportal lymphadenopathy. No pelvic lymphadenopathy. Reproductive: Post hysterectomy.  Adnexa unremarkable Other: No free fluid. Musculoskeletal: No aggressive osseous lesion. IMPRESSION: 1. No acute findings in the abdomen pelvis. 2. Large 6 cm LEFT breast mass consistent with breast carcinoma. 3. Extensive pancolonic diverticulosis without evidence acute diverticulitis. 4.  Aortic Atherosclerosis (ICD10-I70.0). Electronically Signed   By: Suzy Bouchard M.D.   On: 12/09/2020 15:23    ASSESSMENT:  1.  Left breast mass in upper outer quadrant: - Presentation to the ER on 12/09/2020 with bleeding per rectum. - CTAP with contrast on 12/09/2020 shows large mass in the left breast measuring 4.6 x 6.2 cm.  No other clear evidence of metastatic disease. - EGD with gastritis, nonbleeding duodenal ulcers with no stigmata of bleeding, duodenitis.  Stomach biopsy on 12/12/2020 shows mild chronic gastritis, reactive gastropathy. - Colonoscopy on 12/12/2020 with nonbleeding internal hemorrhoids, diverticulosis in the entire colon, blood  in the transverse colon. -She reports having GYN related cancer, underwent TAH in 2008 by Dr. Fermin Schwab at [facility].  I do not have medical records to review. - Reports growing left breast mass over the last 1 year.  Denies any weight loss.  2.  Social/family history: - She lives by herself.  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 - Sister died of breast cancer in her 47s.   PLAN:  1.  Left breast mass in upper outer quadrant: - She reports noticing the left breast mass which has been growing over the past 1 year. - She lives by herself and has difficulty with transportation as she does not drive. - She has bilateral hip arthritis and also reported shoulder pains which are new. - I have recommended mammogram and biopsy of the left breast mass to confirm diagnosis and to know the receptor status. - I have also recommended staging CT of the chest and bone scan. - I have also recommended surgical consultation with Drs. Bridges/Jenkins. - Based on her personal and family history, recommended genetic testing.  Patient would like to do as minimal as possible for her cancer. - We will see her back after the biopsy and scans.  2.  Nausea: - She reports nausea in the mornings. - Nausea only started since she was started on oxycodone for her hip and shoulder pains. - We will start her on standing dose of Compazine in the morning.  She could use Compazine 10 mg every 6 hours as needed.  3.  Hypertension: - Continue Benicar 40 mg daily and Norvasc 5 mg daily.  4.  Normocytic anemia: - Continue iron tablet daily.   All questions were answered. The patient knows to call the clinic with any problems, questions or concerns.   Derek Jack, MD, 12/29/20 7:20 PM  [place] [phone]   I, Milinda Antis, am acting as a scribe for Dr. Sanda Linger.  I, Derek Jack MD, have reviewed the above documentation for accuracy and  completeness, and I agree with the above.

## 2020-12-29 ENCOUNTER — Inpatient Hospital Stay (HOSPITAL_COMMUNITY): Payer: PPO | Attending: Hematology | Admitting: Hematology

## 2020-12-29 ENCOUNTER — Other Ambulatory Visit: Payer: Self-pay

## 2020-12-29 VITALS — BP 115/67 | HR 118 | Temp 97.6°F | Resp 17 | Wt 195.0 lb

## 2020-12-29 DIAGNOSIS — I10 Essential (primary) hypertension: Secondary | ICD-10-CM | POA: Diagnosis not present

## 2020-12-29 DIAGNOSIS — K5790 Diverticulosis of intestine, part unspecified, without perforation or abscess without bleeding: Secondary | ICD-10-CM | POA: Diagnosis not present

## 2020-12-29 DIAGNOSIS — Z79899 Other long term (current) drug therapy: Secondary | ICD-10-CM | POA: Insufficient documentation

## 2020-12-29 DIAGNOSIS — I700 Atherosclerosis of aorta: Secondary | ICD-10-CM

## 2020-12-29 DIAGNOSIS — R200 Anesthesia of skin: Secondary | ICD-10-CM | POA: Diagnosis not present

## 2020-12-29 DIAGNOSIS — R109 Unspecified abdominal pain: Secondary | ICD-10-CM | POA: Insufficient documentation

## 2020-12-29 DIAGNOSIS — Z87891 Personal history of nicotine dependence: Secondary | ICD-10-CM | POA: Diagnosis not present

## 2020-12-29 DIAGNOSIS — D649 Anemia, unspecified: Secondary | ICD-10-CM | POA: Insufficient documentation

## 2020-12-29 DIAGNOSIS — R42 Dizziness and giddiness: Secondary | ICD-10-CM

## 2020-12-29 DIAGNOSIS — Z803 Family history of malignant neoplasm of breast: Secondary | ICD-10-CM

## 2020-12-29 DIAGNOSIS — R110 Nausea: Secondary | ICD-10-CM | POA: Diagnosis not present

## 2020-12-29 DIAGNOSIS — K5900 Constipation, unspecified: Secondary | ICD-10-CM | POA: Insufficient documentation

## 2020-12-29 DIAGNOSIS — K648 Other hemorrhoids: Secondary | ICD-10-CM

## 2020-12-29 DIAGNOSIS — C50412 Malignant neoplasm of upper-outer quadrant of left female breast: Secondary | ICD-10-CM | POA: Diagnosis not present

## 2020-12-29 DIAGNOSIS — M2550 Pain in unspecified joint: Secondary | ICD-10-CM | POA: Diagnosis not present

## 2020-12-29 DIAGNOSIS — G479 Sleep disorder, unspecified: Secondary | ICD-10-CM | POA: Insufficient documentation

## 2020-12-29 NOTE — Patient Instructions (Addendum)
[place] at [facility] Discharge Instructions  You were seen and examined today by Dr. Delton Coombes. Dr. Delton Coombes is a medical oncologist, meaning he specializes in the management of cancer diagnoses with medication. Dr. Delton Coombes discussed your past medical history, family history of cancer and the events that led to you being here today.  A left breast mass was noted on your recent CT scan, this is cancer. There was no other findings suspicious of cancer spread in your abdomen and pelvis. Dr. Delton Coombes has recommended a CT scan of your chest to ensure that there is nothing concerning of cancer in your chest outside of your breast. Dr. Delton Coombes also recommended a bone scan, this will help drive treatment options. Dr. Delton Coombes has also discussed genetic testing to determine if there is a genetic link to your cancer. This is not necessary for your treatment but could be beneficial especially for your children. If you would like to proceed with genetic testing, we can do that at any time, please let us at the [facility].  Proceed with your mammogram on Tuesday. A tissue diagnosis to identify the specific cancer that you have is obtained through biopsy and will be required to continue further treatment.   We will see you back in about 2 weeks.    Thank you for choosing [place] at [facility] to provide your oncology and hematology care.  To afford each patient quality time with our provider, please arrive at least 15 minutes before your scheduled appointment time.   If you have a lab appointment with the [facility] please come in thru the Main Entrance and check in at the main information desk.  You need to re-schedule your appointment should you arrive 10 or more minutes late.  We strive to give you quality time with our providers, and arriving late affects you and other patients whose appointments are after yours.  Also, if you no show  three or more times for appointments you may be dismissed from the clinic at the providers discretion.     Again, thank you for choosing [facility]  Our hope is that these requests will decrease the amount of time that you wait before being seen by our physicians.       _____________________________________________________________  Should you have questions after your visit to [facility], please contact our office at [phone] and follow the prompts.  Our office hours are 8:00 a.m. and 4:30 p.m. Monday - Friday.  Please note that voicemails left after 4:00 p.m. may not be returned until the following business day.  We are closed weekends and major holidays.  You do have access to a nurse 24-7, just call the main number to the clinic [phone] and do not press any options, hold on the line and a nurse will answer the phone.    For prescription refill requests, have your pharmacy contact our office and allow 72 hours.    Due to Covid, you will need to wear a mask upon entering the hospital. If you do not have a mask, a mask will be given to you at the Main Entrance upon arrival. For doctor visits, patients may have 1 support person age 20 or older with them. For treatment visits, patients can not have anyone with them due to social distancing guidelines and our immunocompromised population.

## 2021-01-03 ENCOUNTER — Ambulatory Visit (HOSPITAL_COMMUNITY): Payer: PPO

## 2021-01-03 ENCOUNTER — Encounter (HOSPITAL_COMMUNITY): Payer: PPO

## 2021-01-05 ENCOUNTER — Other Ambulatory Visit (HOSPITAL_COMMUNITY): Payer: PPO

## 2021-01-05 DIAGNOSIS — U071 COVID-19: Secondary | ICD-10-CM | POA: Diagnosis not present

## 2021-01-05 DIAGNOSIS — Z170 Estrogen receptor positive status [ER+]: Secondary | ICD-10-CM | POA: Diagnosis not present

## 2021-01-05 DIAGNOSIS — D649 Anemia, unspecified: Secondary | ICD-10-CM | POA: Diagnosis not present

## 2021-01-05 DIAGNOSIS — K922 Gastrointestinal hemorrhage, unspecified: Secondary | ICD-10-CM | POA: Diagnosis not present

## 2021-01-05 DIAGNOSIS — M25559 Pain in unspecified hip: Secondary | ICD-10-CM | POA: Diagnosis not present

## 2021-01-05 DIAGNOSIS — I10 Essential (primary) hypertension: Secondary | ICD-10-CM | POA: Diagnosis not present

## 2021-01-05 DIAGNOSIS — K5731 Diverticulosis of large intestine without perforation or abscess with bleeding: Secondary | ICD-10-CM | POA: Diagnosis not present

## 2021-01-05 DIAGNOSIS — C50512 Malignant neoplasm of lower-outer quadrant of left female breast: Secondary | ICD-10-CM | POA: Diagnosis not present

## 2021-01-12 ENCOUNTER — Ambulatory Visit: Payer: PPO | Admitting: General Surgery

## 2021-01-14 DIAGNOSIS — K922 Gastrointestinal hemorrhage, unspecified: Secondary | ICD-10-CM | POA: Diagnosis not present

## 2021-01-14 DIAGNOSIS — D500 Iron deficiency anemia secondary to blood loss (chronic): Secondary | ICD-10-CM | POA: Diagnosis not present

## 2021-01-16 DIAGNOSIS — U071 COVID-19: Secondary | ICD-10-CM | POA: Diagnosis not present

## 2021-01-16 DIAGNOSIS — C50512 Malignant neoplasm of lower-outer quadrant of left female breast: Secondary | ICD-10-CM | POA: Diagnosis not present

## 2021-01-16 DIAGNOSIS — M25559 Pain in unspecified hip: Secondary | ICD-10-CM | POA: Diagnosis not present

## 2021-01-16 DIAGNOSIS — Z170 Estrogen receptor positive status [ER+]: Secondary | ICD-10-CM | POA: Diagnosis not present

## 2021-01-16 DIAGNOSIS — D649 Anemia, unspecified: Secondary | ICD-10-CM | POA: Diagnosis not present

## 2021-01-16 DIAGNOSIS — K5731 Diverticulosis of large intestine without perforation or abscess with bleeding: Secondary | ICD-10-CM | POA: Diagnosis not present

## 2021-01-16 DIAGNOSIS — K922 Gastrointestinal hemorrhage, unspecified: Secondary | ICD-10-CM | POA: Diagnosis not present

## 2021-01-16 DIAGNOSIS — I10 Essential (primary) hypertension: Secondary | ICD-10-CM | POA: Diagnosis not present

## 2021-01-17 ENCOUNTER — Ambulatory Visit (INDEPENDENT_AMBULATORY_CARE_PROVIDER_SITE_OTHER): Payer: PPO | Admitting: Internal Medicine

## 2021-01-17 ENCOUNTER — Other Ambulatory Visit: Payer: Self-pay

## 2021-01-17 ENCOUNTER — Encounter (HOSPITAL_COMMUNITY): Payer: Self-pay | Admitting: Emergency Medicine

## 2021-01-17 ENCOUNTER — Emergency Department (HOSPITAL_COMMUNITY): Payer: PPO

## 2021-01-17 ENCOUNTER — Emergency Department (HOSPITAL_COMMUNITY)
Admission: EM | Admit: 2021-01-17 | Discharge: 2021-01-17 | Disposition: A | Discharge status: Home / Self Care | Payer: PPO | Attending: Emergency Medicine | Admitting: Emergency Medicine

## 2021-01-17 DIAGNOSIS — Z79899 Other long term (current) drug therapy: Secondary | ICD-10-CM | POA: Insufficient documentation

## 2021-01-17 DIAGNOSIS — Z7401 Bed confinement status: Secondary | ICD-10-CM | POA: Diagnosis not present

## 2021-01-17 DIAGNOSIS — E119 Type 2 diabetes mellitus without complications: Secondary | ICD-10-CM | POA: Insufficient documentation

## 2021-01-17 DIAGNOSIS — Z8544 Personal history of malignant neoplasm of other female genital organs: Secondary | ICD-10-CM | POA: Insufficient documentation

## 2021-01-17 DIAGNOSIS — I10 Essential (primary) hypertension: Secondary | ICD-10-CM | POA: Insufficient documentation

## 2021-01-17 DIAGNOSIS — Z87891 Personal history of nicotine dependence: Secondary | ICD-10-CM | POA: Diagnosis not present

## 2021-01-17 DIAGNOSIS — M25551 Pain in right hip: Secondary | ICD-10-CM | POA: Insufficient documentation

## 2021-01-17 DIAGNOSIS — M25519 Pain in unspecified shoulder: Secondary | ICD-10-CM | POA: Diagnosis not present

## 2021-01-17 DIAGNOSIS — G8929 Other chronic pain: Secondary | ICD-10-CM | POA: Insufficient documentation

## 2021-01-17 DIAGNOSIS — M25559 Pain in unspecified hip: Secondary | ICD-10-CM | POA: Diagnosis not present

## 2021-01-17 DIAGNOSIS — M25552 Pain in left hip: Secondary | ICD-10-CM | POA: Insufficient documentation

## 2021-01-17 DIAGNOSIS — R000 Tachycardia, unspecified: Secondary | ICD-10-CM | POA: Diagnosis not present

## 2021-01-17 DIAGNOSIS — M79604 Pain in right leg: Secondary | ICD-10-CM | POA: Diagnosis present

## 2021-01-17 DIAGNOSIS — M545 Low back pain, unspecified: Secondary | ICD-10-CM | POA: Diagnosis not present

## 2021-01-17 DIAGNOSIS — R279 Unspecified lack of coordination: Secondary | ICD-10-CM | POA: Diagnosis not present

## 2021-01-17 LAB — BASIC METABOLIC PANEL
Anion gap: 11 (ref 5–15)
BUN: 18 mg/dL (ref 8–23)
CO2: 23 mmol/L (ref 22–32)
Calcium: 9.5 mg/dL (ref 8.9–10.3)
Chloride: 102 mmol/L (ref 98–111)
Creatinine, Ser: 0.96 mg/dL (ref 0.44–1.00)
GFR, Estimated: >60 mL/min (ref >60)
Glucose, Bld: 145 mg/dL — ABNORMAL HIGH (ref 70–99)
Potassium: 4.3 mmol/L (ref 3.5–5.1)
Sodium: 136 mmol/L (ref 135–145)

## 2021-01-17 LAB — CBC WITH DIFFERENTIAL/PLATELET
Abs Immature Granulocytes: 0.05 10*3/uL (ref 0.00–0.07)
Basophils Absolute: 0.1 10*3/uL (ref 0.0–0.1)
Basophils Relative: 0 %
Eosinophils Absolute: 0.2 10*3/uL (ref 0.0–0.5)
Eosinophils Relative: 1 %
HCT: 41.2 % (ref 36.0–46.0)
Hemoglobin: 12.2 g/dL (ref 12.0–15.0)
Immature Granulocytes: 0 %
Lymphocytes Relative: 13 %
Lymphs Abs: 1.8 10*3/uL (ref 0.7–4.0)
MCH: 24.3 pg — ABNORMAL LOW (ref 26.0–34.0)
MCHC: 29.6 g/dL — ABNORMAL LOW (ref 30.0–36.0)
MCV: 81.9 fL (ref 80.0–100.0)
Monocytes Absolute: 1.5 10*3/uL — ABNORMAL HIGH (ref 0.1–1.0)
Monocytes Relative: 11 %
Neutro Abs: 10.3 10*3/uL — ABNORMAL HIGH (ref 1.7–7.7)
Neutrophils Relative %: 75 %
Platelets: 311 10*3/uL (ref 150–400)
RBC: 5.03 MIL/uL (ref 3.87–5.11)
RDW: 19 % — ABNORMAL HIGH (ref 11.5–15.5)
WBC: 13.9 10*3/uL — ABNORMAL HIGH (ref 4.0–10.5)
nRBC: 0 % (ref 0.0–0.2)

## 2021-01-17 MED ORDER — ONDANSETRON HCL 4 MG/2ML IJ SOLN
4.0000 mg | Freq: Once | INTRAMUSCULAR | Status: AC
  Administered 2021-01-17: 4 mg via INTRAVENOUS
  Filled 2021-01-17: qty 2

## 2021-01-17 MED ORDER — OXYCODONE-ACETAMINOPHEN 5-325 MG PO TABS: 1.0000 | ORAL_TABLET | ORAL | 0 refills | Status: DC | PRN

## 2021-01-17 MED ORDER — HYDROMORPHONE HCL 1 MG/ML IJ SOLN
1.0000 mg | Freq: Once | INTRAMUSCULAR | Status: AC
  Administered 2021-01-17: 1 mg via INTRAVENOUS
  Filled 2021-01-17: qty 1

## 2021-01-17 MED ORDER — SODIUM CHLORIDE 0.9 % IV BOLUS
1000.0000 mL | Freq: Once | INTRAVENOUS | Status: AC
  Administered 2021-01-17: 1000 mL via INTRAVENOUS

## 2021-01-17 NOTE — ED Notes (Signed)
O2 dropped to 87% RA.  2L/[place] applied - O2 now 99% Will cont to monitor

## 2021-01-17 NOTE — ED Provider Notes (Signed)
[facility] EMERGENCY DEPARTMENT Provider Note   CSN: [id number] Arrival date & time: 01/17/21  1535     History Chief Complaint  Patient presents with  . Pain    Brittney Carroll is a 75 y.o. female with a history of arthritis, diabetes, hypertension, hyperlipidemia and a left breast mass which has been present for approximately 1 year but is just now undergoing evaluation and staging for suspected probable breast cancer presenting for evaluation of bilateral groin, leg and arm pain.  She actually has a history of chronic hip pain and had been under the care of a pain specialist, when this care ended she was using ibuprofen which resulted in GI bleed requiring admission 2 months ago.  She was discharged from here on December 29 2 a local nursing facility with prescription for oxycodone 5 mg 3 times daily which she states is not controlling her chronic pain.  She denies any injuries or falls.  She suspects she needs surgery for her chronic hip pain but has not undergone evaluation by orthopedics nor has she been told that she has a surgical problem with her hips.   She denies fevers or chills, nausea or vomiting, pain is worsened with movement.  She denies any falls.   HPI     Past Medical History:  Diagnosis Date  . Arthritis   . Breast mass, left   . Cancer ([place])    vaginal 2008  . Diabetes mellitus without complication ([place])   . Hyperlipidemia   . Hypertension     Patient Active Problem List   Diagnosis Date Noted  . Breast mass in female   . Symptomatic anemia   . Goals of care, counseling/discussion   . Palliative care by specialist   . GI bleed 12/09/2020    Past Surgical History:  Procedure Laterality Date  . ABDOMINAL HYSTERECTOMY    . APPENDECTOMY    . BIOPSY  12/12/2020   Procedure: BIOPSY;  Surgeon: Eloise Harman, DO;  Location: [facility] END[facility] SU[facility];  Service: Endoscopy;;  . COLONOSCOPY WITH PROPOFOL N/A 12/12/2020   Procedure: COLONOSCOPY WITH PROPOFOL;  Surgeon:  Eloise Harman, DO;  Location: [facility] END[facility] SU[facility];  Service: Endoscopy;  Laterality: N/A;  . ESOPHAGOGASTRODUODENOSCOPY (EGD) WITH PROPOFOL N/A 12/12/2020   Procedure: ESOPHAGOGASTRODUODENOSCOPY (EGD) WITH PROPOFOL;  Surgeon: Eloise Harman, DO;  Location: [facility] END[facility] SUITE;  Service: Endoscopy;  Laterality: N/A;  . TONSILLECTOMY       OB History   No obstetric history on file.     Family History  Problem Relation Age of Onset  . Breast cancer Sister        Deceased in her 55s    Social History   Tobacco Use  . Smoking status: Former Smoker    Packs/day: 1.50    Years: 7.00    Pack years: 10.50    Types: Cigarettes  . Smokeless tobacco: Never Used  . Tobacco comment: quit 50 yrs ago  Vaping Use  . Vaping Use: Never used  Substance Use Topics  . Alcohol use: Not Currently  . Drug use: Never    Home Medications Prior to Admission medications   Medication Sig Start Date End Date Taking? Authorizing Provider  oxyCODONE-acetaminophen (PERCOCET/ROXICET) 5-325 MG tablet Take 1-2 tablets by mouth every 4 (four) hours as needed. 01/17/21  Yes Nedra Mcinnis, Almyra Free, PA-C  amLODipine (NORVASC) 5 MG tablet Take 1 tablet (5 mg total) by mouth daily. 12/15/20   Orson Eva, MD  ferrous sulfate  325 (65 FE) MG tablet Take 1 tablet (325 mg total) by mouth daily with breakfast. 12/15/20   Tat, Shanon Brow, MD  olmesartan (BENICAR) 40 MG tablet Take 40 mg by mouth daily. 07/12/20   [provider]  pantoprazole (PROTONIX) 40 MG tablet Take 1 tablet (40 mg total) by mouth 2 (two) times daily before a meal. 12/14/20   Tat, Shanon Brow, MD  polyethylene glycol (MIRALAX / GLYCOLAX) 17 g packet Take 17 g by mouth daily. 12/15/20   Orson Eva, MD    Allergies    Atenolol  Review of Systems   Review of Systems  Constitutional: Negative for chills and fever.  Cardiovascular: Negative.   Gastrointestinal: Negative.   Musculoskeletal: Positive for arthralgias and joint swelling. Negative for myalgias.   Neurological: Negative for weakness and numbness.  All other systems reviewed and are negative.   Physical Exam Updated Vital Signs BP 115/60   Pulse (!) 120   Temp 98.7 F (37.1 C) (Oral)   Resp 10   SpO2 93%   Physical Exam Vitals and nursing note reviewed.  Constitutional:      Appearance: She is well-developed and well-nourished.  HENT:     Head: Normocephalic and atraumatic.  Eyes:     Conjunctiva/sclera: Conjunctivae normal.  Cardiovascular:     Rate and Rhythm: Normal rate and regular rhythm.     Pulses: Intact distal pulses.     Heart sounds: Normal heart sounds.     Comments: Pulses equal bilaterally Pulmonary:     Effort: Pulmonary effort is normal.     Breath sounds: Normal breath sounds. No wheezing.  Abdominal:     General: Bowel sounds are normal.     Palpations: Abdomen is soft.     Tenderness: There is no abdominal tenderness.  Musculoskeletal:        General: Tenderness present. No swelling, deformity or signs of injury.     Cervical back: Normal range of motion.     Right hip: No deformity. Decreased range of motion.     Left hip: No deformity. Decreased range of motion.     Right lower leg: No edema.     Left lower leg: No edema.     Comments: Pain with attempts at flexing bilateral hips either active or passive.  Also pain increased with internal/external rotation.  Thighs and calves are soft and nontender, no erythema, edema, rash, nontender over trochanteric bursa's.  Although she endorses generalized pain in her arms, I cannot reproduce this pain with active or passive movements.  Skin:    General: Skin is warm and dry.  Neurological:     Mental Status: She is alert.     Sensory: No sensory deficit.     Deep Tendon Reflexes: Strength normal. Reflexes normal.  Psychiatric:        Mood and Affect: Mood and affect normal.     ED Results / Procedures / Treatments   Labs (all labs ordered are listed, but only abnormal results are  displayed) Labs Reviewed  CBC WITH DIFFERENTIAL/PLATELET - Abnormal; Notable for the following components:      Result Value   WBC 13.9 (*)    MCH 24.3 (*)    MCHC 29.6 (*)    RDW 19.0 (*)    Neutro Abs 10.3 (*)    Monocytes Absolute 1.5 (*)    All other components within normal limits  BASIC METABOLIC PANEL    EKG EKG Interpretation  Date/Time:  Tuesday January 17 2021 16:30:07 EST Ventricular Rate:  118 PR Interval:    QRS Duration: 73 QT Interval:  278 QTC Calculation: 390 R Axis:   -45 Text Interpretation: Sinus tachycardia Abnormal R-wave progression, late transition Inferior infarct, old Lateral leads are also involved Confirmed by Fredia Sorrow [phone]) on 01/17/2021 6:44:47 PM   Radiology DG Lumbar Spine Complete  Result Date: 01/17/2021 CLINICAL DATA:  Acute on chronic pain. New breast mass. Concern for bony metastatic disease. EXAM: LUMBAR SPINE - COMPLETE 4+ VIEW COMPARISON:  Reformats from abdominopelvic CT 12/09/2020 FINDINGS: Lateral view is limited by significant rotation. Slight levo scoliotic curvature. Vertebral body heights are normal. No acute fracture. Mild diffuse disc space narrowing and endplate spurring. Lower lumbar facet hypertrophy. No evidence of focal bone lesion or bony destruction. Retroperitoneal surgical clips are seen. IMPRESSION: 1. Multilevel degenerative change throughout the lumbar spine. 2. No radiographic evidence of focal bone lesion or bony metastatic disease. Electronically Signed   By: Keith Rake M.D.   On: 01/17/2021 18:10   DG Hips Bilat W or Wo Pelvis 3-4 Views  Result Date: 01/17/2021 CLINICAL DATA:  Acute on chronic pain. New breast mass with concern for metastatic disease. EXAM: DG HIP (WITH OR WITHOUT PELVIS) 3-4V BILAT COMPARISON:  Abdominopelvic CT reformats 12/09/2020 FINDINGS: Advanced bilateral hip joint space narrowing, subchondral cystic change and flattening of the femoral heads, left greater than right. An element of  underlying avascular necrosis is considered. No acute fracture. No evidence of focal bone lesion. Pubic symphysis and sacroiliac joints are congruent. Pubic rami are intact. Degenerative change in the lumbosacral junction. IMPRESSION: 1. Advanced bilateral hip joint space narrowing, subchondral cystic change and flattening of the femoral heads, left greater than right. This may be related to advanced osteoarthritis, an element of underlying avascular necrosis is considered. Findings are unchanged from abdominal CT reformats 5 weeks ago. 2. No radiographic evidence of focal bone lesion. Electronically Signed   By: Keith Rake M.D.   On: 01/17/2021 18:08    Procedures Procedures   Medications Ordered in ED Medications  HYDROmorphone (DILAUDID) injection 1 mg (has no administration in time range)  sodium chloride 0.9 % bolus 1,000 mL (1,000 mLs Intravenous New Bag/Given 01/17/21 1755)  ondansetron (ZOFRAN) injection 4 mg (4 mg Intravenous Given 01/17/21 1756)  HYDROmorphone (DILAUDID) injection 1 mg (1 mg Intravenous Given 01/17/21 1756)    ED Course  I have reviewed the triage vital signs and the nursing notes.  Pertinent labs & imaging results that were available during my care of the patient were reviewed by me and considered in my medical decision making (see chart for details).    MDM Rules/Calculators/A&P                          Imaging reviewed and discussed with patient.  She does have advanced degenerative disease in her bilateral hips, also cannot rule out possibility of avascular necrosis.  She may need further evaluation by orthopedics, however she currently is undergoing staging of suspected breast cancer, which takes precedence at this time.  There is no evidence of bony metastasis on today's imaging.  She was given Dilaudid IV x2 here with improving pain symptoms.  There is no evidence of an infectious process, pain is bilateral, bilateral septic arthritis highly unlikely.  We will  increase her pain medication for as needed use, plan follow-up with Dr. Delton Coombes of oncology.  Also given a referral to Dr. Aline Brochure for further  evaluation of her hip x-ray findings.  Pt discussed with Dr Rogene Houston prior to dc home. Final Clinical Impression(s) / ED Diagnoses Final diagnoses:  Pain of both hip joints    Rx / DC Orders ED Discharge Orders         Ordered    oxyCODONE-acetaminophen (PERCOCET/ROXICET) 5-325 MG tablet  Every 4 hours PRN        01/17/21 1911           Landis Martins 01/17/21 1915    Fredia Sorrow, MD 01/22/21 [id number]

## 2021-01-17 NOTE — ED Triage Notes (Signed)
Pt from [facility] home with C/O of bilateral groin pain, leg pain, and bilateral arm pain. Pt reports this pain has been going on for months but has gotten worse today.

## 2021-01-17 NOTE — Discharge Instructions (Signed)
Your x-rays show that you do have moderate arthritis in your hips and may require further evaluation by an orthopedist.  I have given you information for Dr. Aline Brochure who is our on-call orthopedist today.  Keep your appointments with Dr. Delton Coombes including your upcoming imaging test for further evaluation of your breast mass.  I have prescribed you additional oxycodone with increased dosing if needed for better pain relief.

## 2021-01-19 DIAGNOSIS — K5731 Diverticulosis of large intestine without perforation or abscess with bleeding: Secondary | ICD-10-CM | POA: Diagnosis not present

## 2021-01-19 DIAGNOSIS — I10 Essential (primary) hypertension: Secondary | ICD-10-CM | POA: Diagnosis not present

## 2021-01-19 DIAGNOSIS — D649 Anemia, unspecified: Secondary | ICD-10-CM | POA: Diagnosis not present

## 2021-01-19 DIAGNOSIS — C50512 Malignant neoplasm of lower-outer quadrant of left female breast: Secondary | ICD-10-CM | POA: Diagnosis not present

## 2021-01-19 DIAGNOSIS — K922 Gastrointestinal hemorrhage, unspecified: Secondary | ICD-10-CM | POA: Diagnosis not present

## 2021-01-19 DIAGNOSIS — M25559 Pain in unspecified hip: Secondary | ICD-10-CM | POA: Diagnosis not present

## 2021-01-23 ENCOUNTER — Encounter (HOSPITAL_COMMUNITY)
Admission: RE | Admit: 2021-01-23 | Discharge: 2021-01-23 | Disposition: A | Discharge status: Home / Self Care | Payer: PPO | Source: Ambulatory Visit | Attending: Hematology | Admitting: Hematology

## 2021-01-23 ENCOUNTER — Ambulatory Visit (HOSPITAL_COMMUNITY)
Admission: RE | Admit: 2021-01-23 | Discharge: 2021-01-23 | Disposition: A | Discharge status: Home / Self Care | Payer: PPO | Source: Ambulatory Visit | Attending: Hematology | Admitting: Hematology

## 2021-01-23 ENCOUNTER — Inpatient Hospital Stay (HOSPITAL_COMMUNITY): Payer: PPO | Attending: Hematology | Admitting: Hematology

## 2021-01-23 ENCOUNTER — Other Ambulatory Visit: Payer: Self-pay

## 2021-01-23 ENCOUNTER — Other Ambulatory Visit (HOSPITAL_COMMUNITY): Payer: Self-pay | Admitting: Hematology

## 2021-01-23 ENCOUNTER — Encounter (HOSPITAL_COMMUNITY): Payer: Self-pay

## 2021-01-23 VITALS — BP 107/61 | HR 117 | Temp 96.1°F | Resp 23

## 2021-01-23 DIAGNOSIS — M2578 Osteophyte, vertebrae: Secondary | ICD-10-CM | POA: Diagnosis not present

## 2021-01-23 DIAGNOSIS — M2550 Pain in unspecified joint: Secondary | ICD-10-CM | POA: Diagnosis not present

## 2021-01-23 DIAGNOSIS — K269 Duodenal ulcer, unspecified as acute or chronic, without hemorrhage or perforation: Secondary | ICD-10-CM | POA: Insufficient documentation

## 2021-01-23 DIAGNOSIS — K5730 Diverticulosis of large intestine without perforation or abscess without bleeding: Secondary | ICD-10-CM | POA: Insufficient documentation

## 2021-01-23 DIAGNOSIS — Z7901 Long term (current) use of anticoagulants: Secondary | ICD-10-CM | POA: Diagnosis not present

## 2021-01-23 DIAGNOSIS — R32 Unspecified urinary incontinence: Secondary | ICD-10-CM | POA: Diagnosis not present

## 2021-01-23 DIAGNOSIS — M25551 Pain in right hip: Secondary | ICD-10-CM | POA: Diagnosis not present

## 2021-01-23 DIAGNOSIS — K5731 Diverticulosis of large intestine without perforation or abscess with bleeding: Secondary | ICD-10-CM | POA: Diagnosis not present

## 2021-01-23 DIAGNOSIS — R5383 Other fatigue: Secondary | ICD-10-CM | POA: Diagnosis not present

## 2021-01-23 DIAGNOSIS — R42 Dizziness and giddiness: Secondary | ICD-10-CM | POA: Insufficient documentation

## 2021-01-23 DIAGNOSIS — I2699 Other pulmonary embolism without acute cor pulmonale: Secondary | ICD-10-CM

## 2021-01-23 DIAGNOSIS — M25552 Pain in left hip: Secondary | ICD-10-CM | POA: Insufficient documentation

## 2021-01-23 DIAGNOSIS — R102 Pelvic and perineal pain: Secondary | ICD-10-CM | POA: Insufficient documentation

## 2021-01-23 DIAGNOSIS — D649 Anemia, unspecified: Secondary | ICD-10-CM | POA: Diagnosis not present

## 2021-01-23 DIAGNOSIS — R7989 Other specified abnormal findings of blood chemistry: Secondary | ICD-10-CM | POA: Diagnosis not present

## 2021-01-23 DIAGNOSIS — Z79899 Other long term (current) drug therapy: Secondary | ICD-10-CM | POA: Diagnosis not present

## 2021-01-23 DIAGNOSIS — Z8719 Personal history of other diseases of the digestive system: Secondary | ICD-10-CM | POA: Insufficient documentation

## 2021-01-23 DIAGNOSIS — I10 Essential (primary) hypertension: Secondary | ICD-10-CM | POA: Diagnosis not present

## 2021-01-23 DIAGNOSIS — K922 Gastrointestinal hemorrhage, unspecified: Secondary | ICD-10-CM | POA: Diagnosis not present

## 2021-01-23 DIAGNOSIS — C50412 Malignant neoplasm of upper-outer quadrant of left female breast: Secondary | ICD-10-CM

## 2021-01-23 DIAGNOSIS — M19012 Primary osteoarthritis, left shoulder: Secondary | ICD-10-CM | POA: Diagnosis not present

## 2021-01-23 DIAGNOSIS — R0602 Shortness of breath: Secondary | ICD-10-CM | POA: Insufficient documentation

## 2021-01-23 DIAGNOSIS — K2950 Unspecified chronic gastritis without bleeding: Secondary | ICD-10-CM | POA: Insufficient documentation

## 2021-01-23 DIAGNOSIS — M25519 Pain in unspecified shoulder: Secondary | ICD-10-CM | POA: Insufficient documentation

## 2021-01-23 DIAGNOSIS — Z8616 Personal history of COVID-19: Secondary | ICD-10-CM | POA: Diagnosis not present

## 2021-01-23 DIAGNOSIS — K319 Disease of stomach and duodenum, unspecified: Secondary | ICD-10-CM | POA: Diagnosis not present

## 2021-01-23 DIAGNOSIS — C50512 Malignant neoplasm of lower-outer quadrant of left female breast: Secondary | ICD-10-CM | POA: Diagnosis not present

## 2021-01-23 DIAGNOSIS — C3490 Malignant neoplasm of unspecified part of unspecified bronchus or lung: Secondary | ICD-10-CM | POA: Diagnosis not present

## 2021-01-23 DIAGNOSIS — C50912 Malignant neoplasm of unspecified site of left female breast: Secondary | ICD-10-CM | POA: Diagnosis not present

## 2021-01-23 MED ORDER — IOHEXOL 300 MG/ML  SOLN
75.0000 mL | Freq: Once | INTRAMUSCULAR | Status: AC | PRN
  Administered 2021-01-23: 75 mL via INTRAVENOUS

## 2021-01-23 MED ORDER — TECHNETIUM TC 99M MEDRONATE IV KIT
20.0000 | PACK | Freq: Once | INTRAVENOUS | Status: AC | PRN
  Administered 2021-01-23: 19 via INTRAVENOUS

## 2021-01-23 MED ORDER — ENOXAPARIN SODIUM 120 MG/0.8ML ~~LOC~~ SOLN
120.0000 mg | Freq: Once | SUBCUTANEOUS | Status: AC
  Administered 2021-01-23: 120 mg via SUBCUTANEOUS
  Filled 2021-01-23: qty 0.8

## 2021-01-23 MED ORDER — APIXABAN (ELIQUIS) VTE STARTER PACK (10MG AND 5MG): ORAL_TABLET | ORAL | 0 refills | Status: DC

## 2021-01-23 MED ORDER — HYDROMORPHONE HCL 2 MG PO TABS
2.0000 mg | ORAL_TABLET | Freq: Once | ORAL | Status: AC
  Administered 2021-01-23: 2 mg via ORAL
  Filled 2021-01-23: qty 1

## 2021-01-23 MED ORDER — HYDROMORPHONE HCL 2 MG PO TABS: 2.0000 mg | ORAL_TABLET | ORAL | 0 refills | Status: DC | PRN

## 2021-01-23 NOTE — Patient Instructions (Signed)
[place] at [facility] Discharge Instructions  You were seen today by Dr. Delton Coombes. He went over your recent results and scans. You will be started on Lovenox for your pulmonary embolism (blood clot in the lungs). Dr. Delton Coombes will see you back on February 9th for follow up.   Thank you for choosing [place] at [facility] to provide your oncology and hematology care.  To afford each patient quality time with our provider, please arrive at least 15 minutes before your scheduled appointment time.   If you have a lab appointment with the [facility] please come in thru the Main Entrance and check in at the main information desk  You need to re-schedule your appointment should you arrive 10 or more minutes late.  We strive to give you quality time with our providers, and arriving late affects you and other patients whose appointments are after yours.  Also, if you no show three or more times for appointments you may be dismissed from the clinic at the providers discretion.     Again, thank you for choosing [facility].  Our hope is that these requests will decrease the amount of time that you wait before being seen by our physicians.       _____________________________________________________________  Should you have questions after your visit to [facility], please contact our office at [phone] between the hours of 8:00 a.m. and 4:30 p.m.  Voicemails left after 4:00 p.m. will not be returned until the following business day.  For prescription refill requests, have your pharmacy contact our office and allow 72 hours.    Cancer Center Support Programs:   > Cancer Support Group  2nd Tuesday of the month 1pm-2pm, Journey Room

## 2021-01-23 NOTE — Progress Notes (Signed)
[name] [address] [place] [address]   Patient Care Team: Celene Squibb, MD as PCP - General (Internal Medicine)  SUMMARY OF ONCOLOGIC HISTORY: Oncology History   No history exists.    CHIEF COMPLIANT: Incidental pulmonary embolism on CT scan.   INTERVAL HISTORY: [name] is a 75 y.o. female here today for follow up of her left breast cancer. Her last visit was on 12/29/2020.   Today she is accompanied by her driver and she reports feeling poorly. She complains of having pain all over her body, with the worst pain in her groin area; she has pain in both hips and shoulders and hurt with moving. She is taking Percocet 5/325 and she took her last dose this AM, but it is not helping her pain much. She reports having constant nausea with occasional vomiting and SOB even at rest. She denies ever having any brain bleeds or recent hematochezia or hematuria. Her appetite is decreased and her energy levels are depleted.  She currently lives at [facility]. She wants to go to hospice since she does not want to go back to [facility]. She reports that she tried to work with PT in the facility, but is adamant about continuing it due to pain.  REVIEW OF SYSTEMS:   Review of Systems  Constitutional: Positive for appetite change (25%) and fatigue (depleted).  Respiratory: Positive for shortness of breath (at rest).   Gastrointestinal: Positive for nausea (constant) and vomiting. Negative for blood in stool.  Genitourinary: Positive for bladder incontinence and pelvic pain (9/10 groin pain). Negative for hematuria.   Musculoskeletal: Positive for arthralgias (bilat hips & shoulders).  Neurological: Positive for dizziness.  All other systems reviewed and are negative.   I have reviewed the past medical history, past surgical history, social history and family history with the patient and they are unchanged from previous note.   ALLERGIES:   is allergic to  atenolol.   MEDICATIONS:  Current Outpatient Medications  Medication Sig Dispense Refill  . amLODipine (NORVASC) 5 MG tablet Take 1 tablet (5 mg total) by mouth daily.    . APIXABAN (ELIQUIS) VTE STARTER PACK (10MG  AND 5MG ) Take as directed on package: start with two-5mg  tablets twice daily for 7 days. On day 8, switch to one-5mg  tablet twice daily. 1 tablet 0  . ferrous sulfate 325 (65 FE) MG tablet Take 1 tablet (325 mg total) by mouth daily with breakfast.  3  . HYDROmorphone (DILAUDID) 2 MG tablet Take 1 tablet (2 mg total) by mouth every 4 (four) hours as needed for severe pain. 60 tablet 0  . olmesartan (BENICAR) 40 MG tablet Take 40 mg by mouth daily.    Marland Kitchen oxyCODONE-acetaminophen (PERCOCET/ROXICET) 5-325 MG tablet Take 1-2 tablets by mouth every 4 (four) hours as needed. 30 tablet 0  . pantoprazole (PROTONIX) 40 MG tablet Take 1 tablet (40 mg total) by mouth 2 (two) times daily before a meal.    . polyethylene glycol (MIRALAX / GLYCOLAX) 17 g packet Take 17 g by mouth daily. 14 each 0   Current Facility-Administered Medications  Medication Dose Route Frequency Provider Last Rate Last Admin  . enoxaparin (LOVENOX) injection 120 mg  120 mg Subcutaneous Once Derek Jack, MD      . HYDROmorphone (DILAUDID) tablet 2 mg  2 mg Oral Once Derek Jack, MD         PHYSICAL EXAMINATION: Performance status (ECOG): 2 - Symptomatic, <50% confined  to bed  Vitals:   01/23/21 1117  BP: 107/61  Pulse: (!) 117  Resp: (!) 23  Temp: (!) 96.1 F (35.6 C)  SpO2: 98%   Wt Readings from Last 3 Encounters:  12/29/20 195 lb (88.5 kg)  12/10/20 187 lb 9.8 oz (85.1 kg)  10/18/15 200 lb (90.7 kg)   Physical Exam Vitals reviewed.  Constitutional:      General: She is not in acute distress.    Appearance: Normal appearance. She is not ill-appearing, toxic-appearing or diaphoretic.     Comments: In wheelchair  Neurological:     General: No focal deficit present.     Mental  Status: She is alert and oriented to person, place, and time.  Psychiatric:        Mood and Affect: Mood normal.        Behavior: Behavior normal.     Breast Exam Chaperone: Milinda Antis, MD     LABORATORY DATA:  I have reviewed the data as listed CMP Latest Ref Rng & Units 01/17/2021 12/09/2020  Glucose 70 - 99 mg/dL 145(H) 228(H)  BUN 8 - 23 mg/dL 18 19  Creatinine 0.44 - 1.00 mg/dL 0.96 0.79  Sodium 135 - 145 mmol/L 136 137  Potassium 3.5 - 5.1 mmol/L 4.3 3.7  Chloride 98 - 111 mmol/L 102 105  CO2 22 - 32 mmol/L 23 24  Calcium 8.9 - 10.3 mg/dL 9.5 9.3  Total Protein 6.5 - 8.1 g/dL - 6.8  Total Bilirubin 0.3 - 1.2 mg/dL - 0.3  Alkaline Phos 38 - 126 U/L - 83  AST 15 - 41 U/L - 14(L)  ALT 0 - 44 U/L - 12   No results found for: DUK025 Lab Results  Component Value Date   WBC 13.9 (H) 01/17/2021   HGB 12.2 01/17/2021   HCT 41.2 01/17/2021   MCV 81.9 01/17/2021   PLT 311 01/17/2021   NEUTROABS 10.3 (H) 01/17/2021    ASSESSMENT:  1.  Left breast mass in upper outer quadrant: - Presentation to the ER on 12/09/2020 with bleeding per rectum. - CTAP with contrast on 12/09/2020 shows large mass in the left breast measuring 4.6 x 6.2 cm.  No other clear evidence of metastatic disease. - EGD with gastritis, nonbleeding duodenal ulcers with no stigmata of bleeding, duodenitis.  Stomach biopsy on 12/12/2020 shows mild chronic gastritis, reactive gastropathy. - Colonoscopy on 12/12/2020 with nonbleeding internal hemorrhoids, diverticulosis in the entire colon, blood in the transverse colon. -She reports having GYN related cancer, underwent TAH in 2008 by Dr. Fermin Schwab at [facility].  I do not have medical records to review. - Reports growing left breast mass over the last 1 year.  Denies any weight loss.  2.  Social/family history: - She lives by herself.  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 - Sister died of breast cancer in her 64s.   PLAN:  1.  Left  breast mass in upper outer quadrant: - She is currently undergoing staging work-up with CT CAP and bone scan which will be done later today. -I have also recommended genetic testing which she was not interested in. -She also has appointment with general surgery.  She has a breast biopsy also scheduled.  2.  Nausea: - Continue Compazine every 6 hours as needed.  3.  Hypertension: -Continue Benicar 40 mg daily and Norvasc 5 mg daily.  4.  Generalized body pains: -She reports that Percocet given at the nursing home is not  helping. -We will start her on Dilaudid 2 mg every 4 hours as needed.  5.  Pulmonary embolism: -CT scan done today showed incidental pulmonary embolism with large volume right main pulmonary artery extending into upper, middle and lower lobes. -She does not report any pleuritic chest pain.  Oxygen saturations are 98%. -She has tachycardia for the last 1 month.  She is immobile and has breast cancer, possible etiology for pulmonary embolism. -Recommend anticoagulation as outpatient as she is stable. -We have given Lovenox 1.5 mg/kg dose today.  We have given prescription for Eliquis which will be started tomorrow morning.     Orders Placed This Encounter  Procedures  . Ambulatory Referral to Palliative Care    Referral Priority:   Routine    Referral Type:   Consultation    Referral Reason:   Symptom Managment    Number of Visits Requested:   1   The patient has a good understanding of the overall plan. she agrees with it. she will call with any problems that may develop before the next visit here.    Derek Jack, MD [place] [phone]   I, Milinda Antis, am acting as a scribe for Dr. Sanda Linger.  I, Derek Jack MD, have reviewed the above documentation for accuracy and completeness, and I agree with the above.

## 2021-01-24 ENCOUNTER — Other Ambulatory Visit (HOSPITAL_COMMUNITY): Payer: Self-pay | Admitting: Internal Medicine

## 2021-01-24 ENCOUNTER — Other Ambulatory Visit (HOSPITAL_COMMUNITY): Payer: PPO

## 2021-01-24 ENCOUNTER — Ambulatory Visit (HOSPITAL_COMMUNITY)
Admission: RE | Admit: 2021-01-24 | Discharge: 2021-01-24 | Disposition: A | Discharge status: Home / Self Care | Payer: PPO | Source: Ambulatory Visit | Attending: Internal Medicine | Admitting: Internal Medicine

## 2021-01-24 ENCOUNTER — Telehealth (HOSPITAL_COMMUNITY): Payer: Self-pay | Admitting: Surgery

## 2021-01-24 DIAGNOSIS — N63 Unspecified lump in unspecified breast: Secondary | ICD-10-CM

## 2021-01-24 DIAGNOSIS — N6320 Unspecified lump in the left breast, unspecified quadrant: Secondary | ICD-10-CM | POA: Diagnosis present

## 2021-01-24 DIAGNOSIS — R928 Other abnormal and inconclusive findings on diagnostic imaging of breast: Secondary | ICD-10-CM

## 2021-01-24 DIAGNOSIS — R921 Mammographic calcification found on diagnostic imaging of breast: Secondary | ICD-10-CM | POA: Diagnosis not present

## 2021-01-24 DIAGNOSIS — R920 Mammographic microcalcification found on diagnostic imaging of breast: Secondary | ICD-10-CM | POA: Diagnosis not present

## 2021-01-24 DIAGNOSIS — N6321 Unspecified lump in the left breast, upper outer quadrant: Secondary | ICD-10-CM | POA: Diagnosis not present

## 2021-01-24 NOTE — Telephone Encounter (Signed)
The nurse from [facility] had called yesterday to clarify the pt's pain medications.  Dr. Delton Coombes had prescribed Dilaudid yesterday and d/c the pt's Percocet.  However, the nurse from [facility] stated that the pt was also taking Tramadol 50 mg BID.  I notified Dr. Delton Coombes who ordered for the Tramadol to be d/c.  I called [place] and spoke to April, and gave the verbal order to d/c the Tramadol.

## 2021-01-25 ENCOUNTER — Ambulatory Visit (HOSPITAL_COMMUNITY): Payer: PPO | Admitting: Hematology

## 2021-01-25 ENCOUNTER — Encounter (INDEPENDENT_AMBULATORY_CARE_PROVIDER_SITE_OTHER): Payer: Self-pay | Admitting: Internal Medicine

## 2021-01-25 ENCOUNTER — Telehealth (INDEPENDENT_AMBULATORY_CARE_PROVIDER_SITE_OTHER): Payer: Self-pay | Admitting: *Deleted

## 2021-01-25 ENCOUNTER — Ambulatory Visit (INDEPENDENT_AMBULATORY_CARE_PROVIDER_SITE_OTHER): Payer: PPO | Admitting: Internal Medicine

## 2021-01-25 NOTE — Telephone Encounter (Signed)
Patient was a no show for her appointment today 01/25/2021 with Dr.Rehman.

## 2021-01-26 DIAGNOSIS — I10 Essential (primary) hypertension: Secondary | ICD-10-CM | POA: Diagnosis not present

## 2021-01-26 DIAGNOSIS — M25559 Pain in unspecified hip: Secondary | ICD-10-CM | POA: Diagnosis not present

## 2021-01-26 DIAGNOSIS — C50512 Malignant neoplasm of lower-outer quadrant of left female breast: Secondary | ICD-10-CM | POA: Diagnosis not present

## 2021-01-26 DIAGNOSIS — K922 Gastrointestinal hemorrhage, unspecified: Secondary | ICD-10-CM | POA: Diagnosis not present

## 2021-01-26 DIAGNOSIS — D649 Anemia, unspecified: Secondary | ICD-10-CM | POA: Diagnosis not present

## 2021-01-26 DIAGNOSIS — Z170 Estrogen receptor positive status [ER+]: Secondary | ICD-10-CM | POA: Diagnosis not present

## 2021-01-26 DIAGNOSIS — R52 Pain, unspecified: Secondary | ICD-10-CM | POA: Diagnosis not present

## 2021-01-30 ENCOUNTER — Ambulatory Visit: Payer: PPO | Admitting: Orthopedic Surgery

## 2021-01-31 ENCOUNTER — Ambulatory Visit: Payer: PPO | Admitting: General Surgery

## 2021-01-31 ENCOUNTER — Ambulatory Visit (HOSPITAL_COMMUNITY): Payer: PPO | Admitting: Hematology

## 2021-02-02 ENCOUNTER — Ambulatory Visit (INDEPENDENT_AMBULATORY_CARE_PROVIDER_SITE_OTHER): Payer: PPO | Admitting: Internal Medicine

## 2021-02-02 DIAGNOSIS — Z170 Estrogen receptor positive status [ER+]: Secondary | ICD-10-CM | POA: Diagnosis not present

## 2021-02-02 DIAGNOSIS — K5731 Diverticulosis of large intestine without perforation or abscess with bleeding: Secondary | ICD-10-CM | POA: Diagnosis not present

## 2021-02-02 DIAGNOSIS — C50512 Malignant neoplasm of lower-outer quadrant of left female breast: Secondary | ICD-10-CM | POA: Diagnosis not present

## 2021-02-02 DIAGNOSIS — M25559 Pain in unspecified hip: Secondary | ICD-10-CM | POA: Diagnosis not present

## 2021-02-02 DIAGNOSIS — K922 Gastrointestinal hemorrhage, unspecified: Secondary | ICD-10-CM | POA: Diagnosis not present

## 2021-02-02 DIAGNOSIS — Z79899 Other long term (current) drug therapy: Secondary | ICD-10-CM | POA: Diagnosis not present

## 2021-02-06 DIAGNOSIS — Z79899 Other long term (current) drug therapy: Secondary | ICD-10-CM | POA: Diagnosis not present

## 2021-02-06 DIAGNOSIS — C50512 Malignant neoplasm of lower-outer quadrant of left female breast: Secondary | ICD-10-CM | POA: Diagnosis not present

## 2021-02-06 DIAGNOSIS — Z170 Estrogen receptor positive status [ER+]: Secondary | ICD-10-CM | POA: Diagnosis not present

## 2021-02-06 DIAGNOSIS — G893 Neoplasm related pain (acute) (chronic): Secondary | ICD-10-CM | POA: Diagnosis not present

## 2021-02-06 DIAGNOSIS — I10 Essential (primary) hypertension: Secondary | ICD-10-CM | POA: Diagnosis not present

## 2021-02-07 ENCOUNTER — Other Ambulatory Visit (HOSPITAL_COMMUNITY): Payer: PPO

## 2021-02-13 ENCOUNTER — Inpatient Hospital Stay (HOSPITAL_BASED_OUTPATIENT_CLINIC_OR_DEPARTMENT_OTHER): Payer: PPO | Admitting: Hematology

## 2021-02-13 ENCOUNTER — Other Ambulatory Visit: Payer: Self-pay

## 2021-02-13 DIAGNOSIS — I2699 Other pulmonary embolism without acute cor pulmonale: Secondary | ICD-10-CM

## 2021-02-13 DIAGNOSIS — C50412 Malignant neoplasm of upper-outer quadrant of left female breast: Secondary | ICD-10-CM

## 2021-02-13 NOTE — Progress Notes (Signed)
Virtual Visit via Telephone Note  I connected with [name] on 02/13/21 at  4:15 PM EST by telephone and verified that I am speaking with the correct person using two identifiers.  Location: Patient: At [facility] Provider: In the office   I discussed the limitations, risks, security and privacy concerns of performing an evaluation and management service by telephone and the availability of in person appointments. I also discussed with the patient that there may be a patient responsible charge related to this service. The patient expressed understanding and agreed to proceed.   History of Present Illness: She is seen for her left breast cancer in our office.  She had a history of GYN related cancer underwent TAH in 2008 by Dr. Fermin Schwab at [facility].  She reports having left breast mass over last 1 year.   Observations/Objective: I have initially connected with her daughter [name] at [phone] and we had a teleconference with the patient at [facility].  She is taking blood thinner without any bleeding issues.  She is also taking methocarbamol for her body aches.  She is rarely requiring Dilaudid.  Assessment and Plan:  1.  Left breast mass in the upper outer quadrant: -Mammogram on 01/24/2021 showed large mass with associated microcalcifications in the upper outer left breast measuring 7.5 cm with associated skin involvement with marked thickening.  No abnormalities in the right breast.  There is an abnormal left axillary lymph node measuring 1.5 x 1 x 1.6 cm. -I have again explained to her that she needs to have a biopsy and see Dr. Arnoldo Morale for surgical resection. -We also discussed staging bone scan which was negative for metastatic disease.  CT scan showed subcentimeter bilateral lower lobe lung nodules which are not definitive for cancer. -I will talk to her again after the biopsy to discuss results.  2.  Pulmonary embolism: -Staging CT CAP for breast cancer  showed incidental large volume right main pulmonary artery pulmonary embolism with no right heart strain. -She started on Eliquis.  She is tolerating it very well.  No bleeding issues reported.   Follow Up Instructions: Phone visit on 02/24/2021.   I discussed the assessment and treatment plan with the patient. The patient was provided an opportunity to ask questions and all were answered. The patient agreed with the plan and demonstrated an understanding of the instructions.   The patient was advised to call back or seek an in-person evaluation if the symptoms worsen or if the condition fails to improve as anticipated.  I provided 25 minutes of non-face-to-face time during this encounter.   Derek Jack, MD

## 2021-02-14 ENCOUNTER — Ambulatory Visit (HOSPITAL_COMMUNITY)
Admission: RE | Admit: 2021-02-14 | Discharge: 2021-02-14 | Disposition: A | Discharge status: Home / Self Care | Payer: PPO | Source: Ambulatory Visit | Attending: Internal Medicine | Admitting: Internal Medicine

## 2021-02-14 ENCOUNTER — Encounter (HOSPITAL_COMMUNITY): Payer: Self-pay

## 2021-02-14 ENCOUNTER — Other Ambulatory Visit (HOSPITAL_COMMUNITY): Payer: Self-pay | Admitting: Internal Medicine

## 2021-02-14 DIAGNOSIS — C50412 Malignant neoplasm of upper-outer quadrant of left female breast: Secondary | ICD-10-CM | POA: Diagnosis not present

## 2021-02-14 DIAGNOSIS — C773 Secondary and unspecified malignant neoplasm of axilla and upper limb lymph nodes: Secondary | ICD-10-CM | POA: Diagnosis not present

## 2021-02-14 DIAGNOSIS — K922 Gastrointestinal hemorrhage, unspecified: Secondary | ICD-10-CM | POA: Diagnosis not present

## 2021-02-14 DIAGNOSIS — R928 Other abnormal and inconclusive findings on diagnostic imaging of breast: Secondary | ICD-10-CM

## 2021-02-14 DIAGNOSIS — I10 Essential (primary) hypertension: Secondary | ICD-10-CM | POA: Diagnosis not present

## 2021-02-14 DIAGNOSIS — Z170 Estrogen receptor positive status [ER+]: Secondary | ICD-10-CM | POA: Diagnosis not present

## 2021-02-14 DIAGNOSIS — M25559 Pain in unspecified hip: Secondary | ICD-10-CM | POA: Diagnosis not present

## 2021-02-14 MED ORDER — LIDOCAINE-EPINEPHRINE (PF) 1 %-1:200000 IJ SOLN
INTRAMUSCULAR | Status: AC
  Administered 2021-02-14: 30 mL
  Filled 2021-02-14: qty 30

## 2021-02-14 MED ORDER — LIDOCAINE HCL (PF) 2 % IJ SOLN
INTRAMUSCULAR | Status: AC
  Administered 2021-02-14: 20 mL
  Filled 2021-02-14: qty 20

## 2021-02-14 NOTE — Discharge Instructions (Signed)
Breast Biopsy, Care After These instructions give you information about caring for yourself after your procedure. Your doctor may also give you more specific instructions. Call your doctor if you have any problems or questions after your procedure. What can I expect after the procedure? After your procedure, it is common to have:  Bruising on your breast.  Numbness, tingling, or pain near your biopsy site. Follow these instructions at home: Medicines  Take over-the-counter and prescription medicines only as told by your doctor.  Do not drive for 24 hours if you were given a medicine to help you relax (sedative) during your procedure.  Do not drink alcohol while taking pain medicine.  Do not drive or use heavy machinery while taking prescription pain medicine. Biopsy site care  Follow instructions from your doctor about how to take care of your cut from surgery (incision) or your puncture area. Make sure you: ? Wash your hands with soap and water before you change your bandage (dressing). If you cannot use soap and water, use hand sanitizer. ? Change your bandage as told by your doctor. ? Leave stitches (sutures), skin glue, or skin tape (adhesive strips) in place. They may need to stay in place for 2 weeks or longer. If tape strips get loose and curl up, you may trim the loose edges. Do not remove tape strips completely unless your doctor says it is okay.  If you have stitches, keep them dry when you take a bath or a shower.  Check your cut or puncture area every day for signs of infection. Check for: ? Redness, swelling, or pain. ? Fluid or blood. ? Warmth. ? Pus or a bad smell.  Protect the biopsy area. Do not let the area get bumped.      Activity  If you had a cut during your procedure, avoid activities that could pull your cut open. These include: ? Stretching. ? Reaching over your head. ? Exercise. ? Sports. ? Lifting anything that weighs more than 3 lb (1.4  kg).  Return to your normal activities as told by your doctor. Ask your doctor what activities are safe for you. Managing pain, stiffness, and swelling If told, put ice on the biopsy site to relieve swelling:  Put ice in a plastic bag.  Place a towel between your skin and the bag.  Leave the ice on for 20 minutes, 2-3 times a day. General instructions  Continue your normal diet.  Wear a good support bra for as long as told by your doctor.  Get checked for extra fluid around your lymph nodes (lymphedema) as often as told by your doctor.  Keep all follow-up visits as told by your doctor. This is important. Contact a doctor if:  You notice any of the following at the biopsy site: ? More redness, swelling, or pain. ? More fluid or blood coming from the site. ? The site feels warm to the touch. ? Pus or a bad smell coming from the site. ? The site breaks open after the stitches or skin tape strips have been removed.  You have a rash.  You have a fever. Get help right away if:  You have more bleeding from the biopsy site. Get help right away if bleeding is more than a small spot.  You have trouble breathing.  You have red streaks around the biopsy site. Summary  After your procedure, it is common to have bruising, numbness, tingling, or pain near the biopsy site.  Do not  drive or use heavy machinery while taking prescription pain medicine.  Wear a good support bra for as long as told by your doctor.  If you had a cut during your procedure, avoid activities that may pull the cut open. Ask your doctor what activities are safe for you. This information is not intended to replace advice given to you by your health care provider. Make sure you discuss any questions you have with your health care provider. Document Revised: 08/15/2020 Document Reviewed: 05/22/2018 Elsevier Patient Education  [place].

## 2021-02-14 NOTE — Sedation Documentation (Signed)
PT tolerated 2 sites of left breast biopsies this am well and left with family via wheelchair at this time. PT verbalized understanding of discharge instructions. Biopsies prepared for pathology and sent to the lab at this time.

## 2021-02-16 DIAGNOSIS — C50512 Malignant neoplasm of lower-outer quadrant of left female breast: Secondary | ICD-10-CM | POA: Diagnosis not present

## 2021-02-16 DIAGNOSIS — L539 Erythematous condition, unspecified: Secondary | ICD-10-CM | POA: Diagnosis not present

## 2021-02-16 DIAGNOSIS — R52 Pain, unspecified: Secondary | ICD-10-CM | POA: Diagnosis not present

## 2021-02-17 LAB — SURGICAL PATHOLOGY

## 2021-02-20 DIAGNOSIS — R52 Pain, unspecified: Secondary | ICD-10-CM | POA: Diagnosis not present

## 2021-02-20 DIAGNOSIS — G893 Neoplasm related pain (acute) (chronic): Secondary | ICD-10-CM | POA: Diagnosis not present

## 2021-02-20 DIAGNOSIS — C50512 Malignant neoplasm of lower-outer quadrant of left female breast: Secondary | ICD-10-CM | POA: Diagnosis not present

## 2021-02-20 DIAGNOSIS — M25559 Pain in unspecified hip: Secondary | ICD-10-CM | POA: Diagnosis not present

## 2021-02-20 DIAGNOSIS — Z170 Estrogen receptor positive status [ER+]: Secondary | ICD-10-CM | POA: Diagnosis not present

## 2021-02-21 ENCOUNTER — Ambulatory Visit: Payer: PPO | Admitting: General Surgery

## 2021-02-24 ENCOUNTER — Other Ambulatory Visit: Payer: Self-pay

## 2021-02-24 ENCOUNTER — Inpatient Hospital Stay (HOSPITAL_COMMUNITY): Payer: PPO | Attending: Hematology | Admitting: Hematology

## 2021-02-24 DIAGNOSIS — C50412 Malignant neoplasm of upper-outer quadrant of left female breast: Secondary | ICD-10-CM

## 2021-02-24 DIAGNOSIS — Z170 Estrogen receptor positive status [ER+]: Secondary | ICD-10-CM | POA: Diagnosis not present

## 2021-02-24 NOTE — Progress Notes (Signed)
Virtual Visit via Telephone Note  I connected with Brittney Carroll on 02/24/21 at 10:00 AM EST by telephone and verified that I am speaking with the correct person using two identifiers.  Location: Patient: In the nursing home Provider: At home   I discussed the limitations, risks, security and privacy concerns of performing an evaluation and management service by telephone and the availability of in person appointments. I also discussed with the patient that there may be a patient responsible charge related to this service. The patient expressed understanding and agreed to proceed.   History of Present Illness: She is seen in our office for left breast mass and lymphadenopathy in the axilla.  She had work-up including a CT CAP which showed multiple lung nodules less than 5 mm in size.  Bone scan is also negative.  She had refused surgical intervention.   Observations/Objective: I have contacted her daughter Brittney Carroll who in turn included her  sister Brittney Carroll.  We had a prolonged discussion about her diagnosis.  After 15 minutes I called Brittney Carroll back who put her mother on speaker phone at the nursing home.  Patient reports that she has been doing reasonably well.  Her pain is more or less well controlled at this time.  Assessment and Plan:  1.  Stage III (T3N1) left breast IDC, ER/PR positive, HER-2 negative: -We discussed biopsy report from 02/14/2021 which showed invasive ductal carcinoma, grade 3.  Lymph node biopsy was also positive for metastatic carcinoma. -We discussed the best possible curative treatment option is surgical resection with mastectomy and lymph node removal. -She is not interested in surgery at this time.  She has missed appointment with Dr. Arnoldo Morale on 02/21/2021. -We discussed other options including starting her on aromatase centimeter anastrozole to downsize the tumor. -We did discuss the side effects including but not limited to hot flashes, musculoskeletal symptoms, decreased  bone mineral density among others. -Patient and her daughter Brittney Carroll would like to talk about it over the weekend. -Brittney Carroll was instructed to call our office if her mother wants to start taking the pill so that we can send a prescription. -I have also told her that she needs frequent monitoring for tolerability and response which means she needs to come to office visits. -First follow-up after starting anastrozole would be 6 weeks with labs and exam.   Follow Up Instructions: To be decided based on her response on Monday or Tuesday.   I discussed the assessment and treatment plan with the patient. The patient was provided an opportunity to ask questions and all were answered. The patient agreed with the plan and demonstrated an understanding of the instructions.   The patient was advised to call back or seek an in-person evaluation if the symptoms worsen or if the condition fails to improve as anticipated.  I provided 30 minutes of non-face-to-face time during this encounter.   Derek Jack, MD

## 2021-03-13 DIAGNOSIS — C50512 Malignant neoplasm of lower-outer quadrant of left female breast: Secondary | ICD-10-CM | POA: Diagnosis not present

## 2021-03-13 DIAGNOSIS — G893 Neoplasm related pain (acute) (chronic): Secondary | ICD-10-CM | POA: Diagnosis not present

## 2021-03-13 DIAGNOSIS — I10 Essential (primary) hypertension: Secondary | ICD-10-CM | POA: Diagnosis not present

## 2021-03-13 DIAGNOSIS — K5731 Diverticulosis of large intestine without perforation or abscess with bleeding: Secondary | ICD-10-CM | POA: Diagnosis not present

## 2021-03-23 DIAGNOSIS — F4321 Adjustment disorder with depressed mood: Secondary | ICD-10-CM | POA: Diagnosis not present

## 2021-03-23 DIAGNOSIS — F330 Major depressive disorder, recurrent, mild: Secondary | ICD-10-CM | POA: Diagnosis not present

## 2021-03-30 DIAGNOSIS — F4321 Adjustment disorder with depressed mood: Secondary | ICD-10-CM | POA: Diagnosis not present

## 2021-04-04 DIAGNOSIS — F4321 Adjustment disorder with depressed mood: Secondary | ICD-10-CM | POA: Diagnosis not present

## 2021-04-10 ENCOUNTER — Emergency Department (HOSPITAL_COMMUNITY)
Admission: EM | Admit: 2021-04-10 | Discharge: 2021-04-11 | Disposition: A | Discharge status: Home / Self Care | Attending: Emergency Medicine | Admitting: Emergency Medicine

## 2021-04-10 ENCOUNTER — Other Ambulatory Visit: Payer: Self-pay

## 2021-04-10 ENCOUNTER — Encounter (HOSPITAL_COMMUNITY): Payer: Self-pay | Admitting: Emergency Medicine

## 2021-04-10 DIAGNOSIS — R0902 Hypoxemia: Secondary | ICD-10-CM | POA: Diagnosis not present

## 2021-04-10 DIAGNOSIS — Z7901 Long term (current) use of anticoagulants: Secondary | ICD-10-CM | POA: Diagnosis not present

## 2021-04-10 DIAGNOSIS — E119 Type 2 diabetes mellitus without complications: Secondary | ICD-10-CM | POA: Insufficient documentation

## 2021-04-10 DIAGNOSIS — Z8544 Personal history of malignant neoplasm of other female genital organs: Secondary | ICD-10-CM | POA: Insufficient documentation

## 2021-04-10 DIAGNOSIS — Z87891 Personal history of nicotine dependence: Secondary | ICD-10-CM | POA: Insufficient documentation

## 2021-04-10 DIAGNOSIS — I10 Essential (primary) hypertension: Secondary | ICD-10-CM | POA: Diagnosis not present

## 2021-04-10 DIAGNOSIS — K5641 Fecal impaction: Secondary | ICD-10-CM | POA: Diagnosis not present

## 2021-04-10 DIAGNOSIS — R404 Transient alteration of awareness: Secondary | ICD-10-CM | POA: Diagnosis not present

## 2021-04-10 DIAGNOSIS — R52 Pain, unspecified: Secondary | ICD-10-CM | POA: Diagnosis not present

## 2021-04-10 NOTE — ED Triage Notes (Signed)
Per nursing home pt is constipated with fecal impaction. Pt denies any pain at this time.

## 2021-04-11 ENCOUNTER — Other Ambulatory Visit (HOSPITAL_COMMUNITY): Payer: Self-pay

## 2021-04-11 DIAGNOSIS — D649 Anemia, unspecified: Secondary | ICD-10-CM

## 2021-04-11 DIAGNOSIS — K5641 Fecal impaction: Secondary | ICD-10-CM | POA: Diagnosis not present

## 2021-04-11 DIAGNOSIS — C50412 Malignant neoplasm of upper-outer quadrant of left female breast: Secondary | ICD-10-CM

## 2021-04-11 MED ORDER — SORBITOL 70 % SOLN
960.0000 mL | TOPICAL_OIL | Freq: Once | ORAL | Status: AC
  Administered 2021-04-11: 960 mL via RECTAL
  Filled 2021-04-11: qty 473

## 2021-04-11 MED ORDER — ONDANSETRON 8 MG PO TBDP
8.0000 mg | ORAL_TABLET | Freq: Once | ORAL | Status: AC
  Administered 2021-04-11: 8 mg via ORAL
  Filled 2021-04-11: qty 1

## 2021-04-11 MED ORDER — LIDOCAINE HCL URETHRAL/MUCOSAL 2 % EX GEL
1.0000 "application " | Freq: Once | CUTANEOUS | Status: AC
  Administered 2021-04-11: 1 via TOPICAL
  Filled 2021-04-11: qty 10

## 2021-04-11 MED ORDER — LIDOCAINE 4 % EX CREA
TOPICAL_CREAM | CUTANEOUS | Status: DC | PRN
  Filled 2021-04-11: qty 5

## 2021-04-11 NOTE — ED Provider Notes (Addendum)
[facility] DEPT Provider Note: Brittney Spurling, MD, FACEP  CSN: [id number] MRN: [id number] ARRIVAL: 04/10/21 at 2055 ROOM: [place]  Fecal Impaction   HISTORY OF PRESENT ILLNESS  04/11/21 1:14 AM Brittney Carroll is a 75 y.o. female hospice patient who has not had a bowel movement in 4 days.  She reportedly has a fecal impaction which her hospice nurse was unable to remove.  Attempting to remove the feces was very painful.  She is also had an enema and laxatives without relief.  She is on narcotics which may be contributing to her symptoms.  She is having some mild right lower quadrant abdominal pain with this.  She is not vomiting.   Past Medical History:  Diagnosis Date  . Arthritis   . Breast mass, left   . Cancer ([place])    vaginal 2008  . Diabetes mellitus without complication ([place])   . Hyperlipidemia   . Hypertension     Past Surgical History:  Procedure Laterality Date  . ABDOMINAL HYSTERECTOMY    . APPENDECTOMY    . BIOPSY  12/12/2020   Procedure: BIOPSY;  Surgeon: Eloise Harman, DO;  Location: [facility];  Service: Endoscopy;;  . COLONOSCOPY WITH PROPOFOL N/A 12/12/2020   Procedure: COLONOSCOPY WITH PROPOFOL;  Surgeon: Eloise Harman, DO;  Location: [facility];  Service: Endoscopy;  Laterality: N/A;  . ESOPHAGOGASTRODUODENOSCOPY (EGD) WITH PROPOFOL N/A 12/12/2020   Procedure: ESOPHAGOGASTRODUODENOSCOPY (EGD) WITH PROPOFOL;  Surgeon: Eloise Harman, DO;  Location: [facility];  Service: Endoscopy;  Laterality: N/A;  . TONSILLECTOMY      Family History  Problem Relation Age of Onset  . Breast cancer Sister        Deceased in her 37s    Social History   Tobacco Use  . Smoking status: Former Smoker    Packs/day: 1.50    Years: 7.00    Pack years: 10.50    Types: Cigarettes  . Smokeless tobacco: Never Used  . Tobacco comment: quit 50 yrs ago  Vaping Use  . Vaping Use: Never used  Substance Use Topics  . Alcohol use:  Not Currently  . Drug use: Never    Prior to Admission medications   Medication Sig Start Date End Date Taking? Authorizing Provider  amLODipine (NORVASC) 5 MG tablet Take 1 tablet (5 mg total) by mouth daily. 12/15/20   Orson Eva, MD  APIXABAN Arne [place]) VTE STARTER PACK (10MG  AND 5MG ) Take as directed on package: start with two-5mg  tablets twice daily for 7 days. On day 8, switch to one-5mg  tablet twice daily. 01/23/21   Derek Jack, MD  ferrous sulfate 325 (65 FE) MG tablet Take 1 tablet (325 mg total) by mouth daily with breakfast. 12/15/20   Tat, Shanon Brow, MD  HYDROmorphone (DILAUDID) 2 MG tablet Take 1 tablet (2 mg total) by mouth every 4 (four) hours as needed for severe pain. 01/23/21   Derek Jack, MD  olmesartan (BENICAR) 40 MG tablet Take 40 mg by mouth daily. 07/12/20   [provider]  oxyCODONE-acetaminophen (PERCOCET/ROXICET) 5-325 MG tablet Take 1-2 tablets by mouth every 4 (four) hours as needed. 01/17/21   Evalee Jefferson, PA-C  pantoprazole (PROTONIX) 40 MG tablet Take 1 tablet (40 mg total) by mouth 2 (two) times daily before a meal. 12/14/20   Tat, Shanon Brow, MD  polyethylene glycol (MIRALAX / GLYCOLAX) 17 g packet Take 17 g by mouth daily. 12/15/20   Orson Eva, MD  Allergies Atenolol   REVIEW OF SYSTEMS  Negative except as noted here or in the History of Present Illness.   PHYSICAL EXAMINATION  Initial Vital Signs Blood pressure 139/73, pulse 93, temperature 98.8 F (37.1 C), temperature source Oral, resp. rate 19, height 5\' 6"  (1.676 m), weight 89 kg, SpO2 97 %.  Examination General: Well-developed, well-nourished female in no acute distress; appearance consistent with age of record HENT: normocephalic; atraumatic Eyes: pupils equal, round and reactive to light; extraocular muscles intact Neck: supple Heart: regular rate and rhythm Lungs: clear to auscultation bilaterally Abdomen: soft; nondistended; mild right lower quadrant tenderness; bowel  sounds present Rectal: Fecal impaction; significant tenderness on DRE Extremities: No deformity; full range of motion; pulses normal Neurologic: Awake, alert and oriented; motor function intact in all extremities and symmetric; no facial droop Skin: Warm and dry Psychiatric: Tearful   RESULTS  Summary of this visit's results, reviewed and interpreted by myself:   EKG Interpretation  Date/Time:    Ventricular Rate:    PR Interval:    QRS Duration:   QT Interval:    QTC Calculation:   R Axis:     Text Interpretation:        Laboratory Studies: No results found for this or any previous visit (from the past 24 hour(s)). Imaging Studies: No results found.  ED COURSE and MDM  Nursing notes, initial and subsequent vitals signs, including pulse oximetry, reviewed and interpreted by myself.  Vitals:   04/10/21 2230 04/10/21 2330 04/11/21 0030 04/11/21 0200  BP: 115/74 138/72 139/73 (!) 143/79  Pulse: 98 92 93 98  Resp: 18 17 19 18   Temp:      TempSrc:      SpO2: 96% 98% 97% 98%  Weight:      Height:       Medications  lidocaine (LMX) 4 % cream (has no administration in time range)  sorbitol, milk of mag, mineral oil, glycerin (SMOG) enema (960 mLs Rectal Given 04/11/21 0234)  lidocaine (XYLOCAINE) 2 % jelly 1 application (1 application Topical Given 04/11/21 0235)    3:16 AM Patient would not tolerate an attempt at manual disimpaction by myself.  Patient would also not tolerate an attempted enema.  This was despite application of lidocaine to the perianal area.  Patient's nurse, Loma Sousa, has been able to assist the patient in passing about 10 hard pieces of feces.  If symptoms worsen or persist, she may need for her PCP to arrange disimpaction under sedation or anesthesia.  PROCEDURES  Procedures   ED DIAGNOSES     ICD-10-CM   1. Fecal impaction in rectum ([place])  K56.41        [name], Jenny Reichmann, MD 04/11/21 3159    Shanon Rosser, MD 04/11/21 [phone]

## 2021-04-11 NOTE — ED Notes (Signed)
Report given to [facility]

## 2021-04-11 NOTE — Discharge Instructions (Addendum)
Your PCP may need to arrange for a surgeon or gastroenterologist to perform a disimpaction under sedation.  This is not a procedure we are equipped to do in the emergency department.

## 2021-04-11 NOTE — ED Notes (Signed)
Pt was able to pass a large amount of stool.

## 2021-04-11 NOTE — ED Notes (Signed)
Called [facility] for transport at this time Brittney Carroll

## 2021-04-11 NOTE — ED Notes (Signed)
Attempted enema on pt and was able to get about 3/4 of SMOG in before pt made [place] take it out saying "take it out I can not do this anymore". Tube taken out and pt passed a small amount of solid stool. Pt placed on bed pan and told to bare down. Will continue to monitor patient.

## 2021-04-12 ENCOUNTER — Inpatient Hospital Stay (HOSPITAL_COMMUNITY): Payer: PPO

## 2021-04-12 ENCOUNTER — Inpatient Hospital Stay (HOSPITAL_COMMUNITY): Payer: PPO | Admitting: Hematology

## 2021-04-13 DIAGNOSIS — N390 Urinary tract infection, site not specified: Secondary | ICD-10-CM | POA: Diagnosis not present

## 2021-04-13 DIAGNOSIS — R319 Hematuria, unspecified: Secondary | ICD-10-CM | POA: Diagnosis not present

## 2021-04-13 DIAGNOSIS — K5731 Diverticulosis of large intestine without perforation or abscess with bleeding: Secondary | ICD-10-CM | POA: Diagnosis not present

## 2021-04-13 DIAGNOSIS — C50512 Malignant neoplasm of lower-outer quadrant of left female breast: Secondary | ICD-10-CM | POA: Diagnosis not present

## 2021-04-26 DIAGNOSIS — F330 Major depressive disorder, recurrent, mild: Secondary | ICD-10-CM | POA: Diagnosis not present

## 2021-05-09 DIAGNOSIS — D649 Anemia, unspecified: Secondary | ICD-10-CM | POA: Diagnosis not present

## 2021-05-09 DIAGNOSIS — C50919 Malignant neoplasm of unspecified site of unspecified female breast: Secondary | ICD-10-CM | POA: Diagnosis not present

## 2021-05-09 DIAGNOSIS — K5900 Constipation, unspecified: Secondary | ICD-10-CM | POA: Diagnosis not present

## 2021-05-11 DIAGNOSIS — R52 Pain, unspecified: Secondary | ICD-10-CM | POA: Diagnosis not present

## 2021-05-11 DIAGNOSIS — C50919 Malignant neoplasm of unspecified site of unspecified female breast: Secondary | ICD-10-CM | POA: Diagnosis not present

## 2021-05-11 DIAGNOSIS — C50512 Malignant neoplasm of lower-outer quadrant of left female breast: Secondary | ICD-10-CM | POA: Diagnosis not present

## 2021-05-11 DIAGNOSIS — G893 Neoplasm related pain (acute) (chronic): Secondary | ICD-10-CM | POA: Diagnosis not present

## 2021-05-11 DIAGNOSIS — Z79899 Other long term (current) drug therapy: Secondary | ICD-10-CM | POA: Diagnosis not present

## 2021-05-11 DIAGNOSIS — Z170 Estrogen receptor positive status [ER+]: Secondary | ICD-10-CM | POA: Diagnosis not present

## 2021-05-17 ENCOUNTER — Encounter (HOSPITAL_COMMUNITY): Payer: Self-pay

## 2021-05-17 NOTE — Progress Notes (Signed)
Multiple calls received from [facility] requesting call back regarding this patient. Numerous attempts have been made to contact [place] but unable to do so.

## 2021-05-22 ENCOUNTER — Ambulatory Visit (HOSPITAL_COMMUNITY): Payer: PPO | Admitting: Hematology

## 2021-05-24 NOTE — Progress Notes (Signed)
[facility] [address] Brittney Carroll   Patient Care Team: Celene Squibb, MD as PCP - General (Internal Medicine)  SUMMARY OF ONCOLOGIC HISTORY: Oncology History  Breast cancer of upper-outer quadrant of left female breast ([place])  02/24/2021 Initial Diagnosis   Breast cancer of upper-outer quadrant of left female breast ([place])   02/24/2021 Cancer Staging   Staging form: Breast, AJCC 8th Edition - Clinical stage from 02/24/2021: Stage IIIA (cT3, cN1(f), cM0, G3, ER+, PR+, HER2-) - Signed by Derek Jack, MD on 02/24/2021 Histopathologic type: Infiltrating duct carcinoma, NOS Stage prefix: Initial diagnosis Method of lymph node assessment: Core biopsy Histologic grading system: 3 grade system     CHIEF COMPLIANT:  Follow-up for left breat cancer   INTERVAL HISTORY: Brittney Carroll is a 75 y.o. female here today for follow up of her left breast cancer. Her last visit was on 01/23/2021.   Today she reports feeling well. She expresses interest in bilateral mastectomy. She denies any issues with bleeding, but reports groin and hip pain and nausea. She denies developing any new pains.   REVIEW OF SYSTEMS:   Review of Systems  Constitutional:  Positive for fatigue (depleted). Negative for appetite change.  Gastrointestinal:  Positive for constipation, nausea and vomiting.  Musculoskeletal:  Positive for arthralgias (10/10 hips, groin, arms, and legs).  Psychiatric/Behavioral:  Positive for sleep disturbance.   All other systems reviewed and are negative.  I have reviewed the past medical history, past surgical history, social history and family history with the patient and they are unchanged from previous note.   ALLERGIES:   is allergic to atenolol.   MEDICATIONS:  Current Outpatient Medications  Medication Sig Dispense Refill   amLODipine (NORVASC) 5 MG tablet Take 1 tablet (5 mg total) by mouth daily.     APIXABAN (ELIQUIS) VTE STARTER PACK  (10MG AND 5MG) Take as directed on package: start with two-83m tablets twice daily for 7 days. On day 8, switch to one-5109mtablet twice daily. 1 tablet 0   ferrous sulfate 325 (65 FE) MG tablet Take 1 tablet (325 mg total) by mouth daily with breakfast.  3   HYDROmorphone (DILAUDID) 2 MG tablet Take 1 tablet (2 mg total) by mouth every 4 (four) hours as needed for severe pain. 60 tablet 0   olmesartan (BENICAR) 40 MG tablet Take 40 mg by mouth daily.     oxyCODONE-acetaminophen (PERCOCET/ROXICET) 5-325 MG tablet Take 1-2 tablets by mouth every 4 (four) hours as needed. 30 tablet 0   pantoprazole (PROTONIX) 40 MG tablet Take 1 tablet (40 mg total) by mouth 2 (two) times daily before a meal.     polyethylene glycol (MIRALAX / GLYCOLAX) 17 g packet Take 17 g by mouth daily. 14 each 0   No current facility-administered medications for this visit.     PHYSICAL EXAMINATION: Performance status (ECOG): 2 - Symptomatic, <50% confined to bed  There were no vitals filed for this visit. Wt Readings from Last 3 Encounters:  04/10/21 196 lb 3.4 oz (89 kg)  12/29/20 195 lb (88.5 kg)  12/10/20 187 lb 9.8 oz (85.1 kg)   Physical Exam Vitals reviewed.  Constitutional:      Appearance: Normal appearance.     Comments: In wheelchair  Cardiovascular:     Rate and Rhythm: Normal rate and regular rhythm.     Pulses: Normal pulses.     Heart sounds: Normal heart sounds.  Pulmonary:  Effort: Pulmonary effort is normal.     Breath sounds: Normal breath sounds.  Chest:  Breasts:    Right: Normal. No mass, tenderness, axillary adenopathy or supraclavicular adenopathy.     Left: Mass (UOQ 10 cm extending into the skin; hard and freely mobile) and axillary adenopathy (1 cm lymph node palpable) present. No tenderness or supraclavicular adenopathy.  Lymphadenopathy:     Cervical: No cervical adenopathy.     Right cervical: No superficial cervical adenopathy.    Left cervical: No superficial cervical  adenopathy.     Upper Body:     Right upper body: No supraclavicular, axillary or pectoral adenopathy.     Left upper body: Axillary adenopathy (1 cm lymph node palpable) present. No supraclavicular or pectoral adenopathy.  Neurological:     General: No focal deficit present.     Mental Status: She is alert and oriented to person, place, and time.  Psychiatric:        Mood and Affect: Mood normal.        Behavior: Behavior normal.    Breast Exam Chaperone: Thana Ates     LABORATORY DATA:  I have reviewed the data as listed CMP Latest Ref Rng & Units 01/17/2021 12/09/2020  Glucose 70 - 99 mg/dL 145(H) 228(H)  BUN 8 - 23 mg/dL 18 19  Creatinine 0.44 - 1.00 mg/dL 0.96 0.79  Sodium 135 - 145 mmol/L 136 137  Potassium 3.5 - 5.1 mmol/L 4.3 3.7  Chloride 98 - 111 mmol/L 102 105  CO2 22 - 32 mmol/L 23 24  Calcium 8.9 - 10.3 mg/dL 9.5 9.3  Total Protein 6.5 - 8.1 g/dL - 6.8  Total Bilirubin 0.3 - 1.2 mg/dL - 0.3  Alkaline Phos 38 - 126 U/L - 83  AST 15 - 41 U/L - 14(L)  ALT 0 - 44 U/L - 12   No results found for: [id number] Lab Results  Component Value Date   WBC 13.9 (H) 01/17/2021   HGB 12.2 01/17/2021   HCT 41.2 01/17/2021   MCV 81.9 01/17/2021   PLT 311 01/17/2021   NEUTROABS 10.3 (H) 01/17/2021    ASSESSMENT:  1.  Left breast mass in upper outer quadrant: - Presentation to the ER on 12/09/2020 with bleeding per rectum. - CTAP with contrast on 12/09/2020 shows large mass in the left breast measuring 4.6 x 6.2 cm.  No other clear evidence of metastatic disease. - EGD with gastritis, nonbleeding duodenal ulcers with no stigmata of bleeding, duodenitis.  Stomach biopsy on 12/12/2020 shows mild chronic gastritis, reactive gastropathy. - Colonoscopy on 12/12/2020 with nonbleeding internal hemorrhoids, diverticulosis in the entire colon, blood in the transverse colon. -She reports having GYN related cancer, underwent TAH in 2008 by Dr. Fermin Schwab at [facility].  I do not  have medical records to review. - Reports growing left breast mass over the last 1 year.  Denies any weight loss. - Left breast biopsy on 02/14/2021 with invasive ductal carcinoma, grade 3.  Left axillary lymph node biopsy consistent with metastatic carcinoma.  ER 70% positive, PR 80% positive, Ki-67 25%, HER2 negative by FISH.   2.  Social/family history: - She lives by herself.  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 - Sister died of breast cancer in her 55s.  PLAN:  1.  T3N1 ER positive infiltrating ductal carcinoma of the left breast: - Last scans including bone scan from 01/23/2021 did not show any evidence of skeletal metastasis. - CT [facility] from  12/09/2020 was negative for any metastatic disease. - CT chest from 01/23/2021 shows small bilateral lung nodules at the bases although difficult to evaluate. - Physical examination today reveals about a 1 cm left breast upper outer quadrant mass, invading into the skin with no ulceration.  There is palpable lymph node in the left axillary region but freely mobile.  Mass is also freely mobile.  No supraclavicular adenopathy.  No mass in the other breast. - She is finally agreeable for left mastectomy and lymph node biopsy. - I have recommended her to start on anastrozole in the interim.  We will make a referral to Dr. Arnoldo Morale for surgical resection. - We discussed side effects of anastrozole in detail.  I plan to see her back in 2 months for follow-up.   2.  Nausea: - She has intermittent nausea.  Continue Compazine every 6 hours as needed.   3.  Hypertension: - Continue Benicar and Norvasc daily.   4.  Generalized body pains: - She is not taking any narcotics due to constipation.   5.  Pulmonary embolism: - Continue Eliquis twice daily.  No bleeding issues reported.  Breast Cancer therapy associated bone loss: I have recommended calcium, Vitamin D and weight bearing exercises.  Orders placed this encounter:  No orders of the defined  types were placed in this encounter.   The patient has a good understanding of the overall plan. She agrees with it. She will call with any problems that may develop before the next visit here.  Derek Jack, MD [place] [phone]   I, Thana Ates, am acting as a scribe for Dr. Derek Jack.  I, Derek Jack MD, have reviewed the above documentation for accuracy and completeness, and I agree with the above.

## 2021-05-25 ENCOUNTER — Inpatient Hospital Stay (HOSPITAL_COMMUNITY): Attending: Hematology | Admitting: Hematology

## 2021-05-25 ENCOUNTER — Other Ambulatory Visit (HOSPITAL_COMMUNITY): Payer: Self-pay | Admitting: *Deleted

## 2021-05-25 ENCOUNTER — Other Ambulatory Visit: Payer: Self-pay

## 2021-05-25 VITALS — BP 113/66 | HR 125 | Temp 97.4°F | Resp 16

## 2021-05-25 DIAGNOSIS — R52 Pain, unspecified: Secondary | ICD-10-CM | POA: Diagnosis not present

## 2021-05-25 DIAGNOSIS — M2550 Pain in unspecified joint: Secondary | ICD-10-CM | POA: Diagnosis not present

## 2021-05-25 DIAGNOSIS — Z79899 Other long term (current) drug therapy: Secondary | ICD-10-CM | POA: Diagnosis not present

## 2021-05-25 DIAGNOSIS — R5383 Other fatigue: Secondary | ICD-10-CM | POA: Diagnosis not present

## 2021-05-25 DIAGNOSIS — K5900 Constipation, unspecified: Secondary | ICD-10-CM | POA: Diagnosis not present

## 2021-05-25 DIAGNOSIS — I10 Essential (primary) hypertension: Secondary | ICD-10-CM | POA: Diagnosis not present

## 2021-05-25 DIAGNOSIS — Z170 Estrogen receptor positive status [ER+]: Secondary | ICD-10-CM

## 2021-05-25 DIAGNOSIS — I2699 Other pulmonary embolism without acute cor pulmonale: Secondary | ICD-10-CM

## 2021-05-25 DIAGNOSIS — G479 Sleep disorder, unspecified: Secondary | ICD-10-CM | POA: Insufficient documentation

## 2021-05-25 DIAGNOSIS — C50412 Malignant neoplasm of upper-outer quadrant of left female breast: Secondary | ICD-10-CM | POA: Insufficient documentation

## 2021-05-25 DIAGNOSIS — R112 Nausea with vomiting, unspecified: Secondary | ICD-10-CM | POA: Insufficient documentation

## 2021-05-25 DIAGNOSIS — C773 Secondary and unspecified malignant neoplasm of axilla and upper limb lymph nodes: Secondary | ICD-10-CM | POA: Insufficient documentation

## 2021-05-25 MED ORDER — ANASTROZOLE 1 MG PO TABS: 1.0000 mg | ORAL_TABLET | Freq: Every day | ORAL | 3 refills | Status: DC

## 2021-05-25 MED ORDER — VITAMIN D 25 MCG (1000 UNIT) PO TABS: 1000.0000 [IU] | ORAL_TABLET | Freq: Every day | ORAL | 3 refills | Status: DC

## 2021-05-25 NOTE — Patient Instructions (Addendum)
[place] at [facility] Discharge Instructions  You were seen today by Dr. Delton Coombes. He went over your recent results. You will be referred to Dr. Arnoldo Morale for a surgery consult. Dr. Delton Coombes will see you back in 2 months for labs and follow up.   Thank you for choosing [place] at [facility] to provide your oncology and hematology care.  To afford each patient quality time with our provider, please arrive at least 15 minutes before your scheduled appointment time.   If you have a lab appointment with the [facility] please come in thru the Main Entrance and check in at the main information desk  You need to re-schedule your appointment should you arrive 10 or more minutes late.  We strive to give you quality time with our providers, and arriving late affects you and other patients whose appointments are after yours.  Also, if you no show three or more times for appointments you may be dismissed from the clinic at the providers discretion.     Again, thank you for choosing [facility].  Our hope is that these requests will decrease the amount of time that you wait before being seen by our physicians.       _____________________________________________________________  Should you have questions after your visit to [facility], please contact our office at [phone] between the hours of 8:00 a.m. and 4:30 p.m.  Voicemails left after 4:00 p.m. will not be returned until the following business day.  For prescription refill requests, have your pharmacy contact our office and allow 72 hours.    Cancer Center Support Programs:   > Cancer Support Group  2nd Tuesday of the month 1pm-2pm, Journey Room

## 2021-06-01 ENCOUNTER — Ambulatory Visit: Payer: PPO | Admitting: General Surgery

## 2021-06-01 DIAGNOSIS — C50919 Malignant neoplasm of unspecified site of unspecified female breast: Secondary | ICD-10-CM | POA: Diagnosis not present

## 2021-06-01 DIAGNOSIS — Z79899 Other long term (current) drug therapy: Secondary | ICD-10-CM | POA: Diagnosis not present

## 2021-06-01 DIAGNOSIS — G893 Neoplasm related pain (acute) (chronic): Secondary | ICD-10-CM | POA: Diagnosis not present

## 2021-06-01 DIAGNOSIS — M25559 Pain in unspecified hip: Secondary | ICD-10-CM | POA: Diagnosis not present

## 2021-06-01 DIAGNOSIS — C50512 Malignant neoplasm of lower-outer quadrant of left female breast: Secondary | ICD-10-CM | POA: Diagnosis not present

## 2021-06-01 DIAGNOSIS — R52 Pain, unspecified: Secondary | ICD-10-CM | POA: Diagnosis not present

## 2021-06-01 DIAGNOSIS — Z170 Estrogen receptor positive status [ER+]: Secondary | ICD-10-CM | POA: Diagnosis not present

## 2021-06-08 ENCOUNTER — Encounter: Payer: Self-pay | Admitting: General Surgery

## 2021-06-08 ENCOUNTER — Other Ambulatory Visit: Payer: Self-pay

## 2021-06-08 ENCOUNTER — Ambulatory Visit (INDEPENDENT_AMBULATORY_CARE_PROVIDER_SITE_OTHER): Admitting: General Surgery

## 2021-06-08 VITALS — BP 107/73 | HR 58 | Temp 97.7°F | Resp 12 | Ht 66.0 in | Wt 196.0 lb

## 2021-06-08 DIAGNOSIS — C50412 Malignant neoplasm of upper-outer quadrant of left female breast: Secondary | ICD-10-CM

## 2021-06-08 DIAGNOSIS — Z170 Estrogen receptor positive status [ER+]: Secondary | ICD-10-CM

## 2021-06-08 NOTE — Progress Notes (Signed)
Brittney Carroll; [id number]; 1945/12/25   HPI Patient is a 75 year old white female with multiple medical problems who was referred to my care by Dr. Nevada Crane and Dr. Delton Coombes of oncology for evaluation and treatment of a left breast carcinoma.  Patient states the mass has been present for approximately 1 year.  It was diagnosed in the hospital in December 2021 when she was being treated for other ailments including anemia.  Apparently she was also diagnosed with a pulmonary embolus at that time and was started on Eliquis.  It has grown significantly since that time and she has had multiple appointments with my office that have been canceled.  She is being followed by Dr. Delton Coombes.  She is currently a resident of [facility] home.  She is wheelchair-bound as her hips hurt and she feels weak.  Work-up reveals an approximately 10 cm invasive ductal carcinoma of the upper, outer quadrant of the left breast with metastasis to a lymph node in the axilla.  It is ER positive.  She has been started on Arimidex.  She has not been referred for radiation therapy.  She states she wants a bilateral mastectomy so she does not have to deal with recurrence of the cancer in the future.  There is no family history of breast cancer. Past Medical History:  Diagnosis Date   Arthritis    Breast mass, left    Cancer [facility])    vaginal 2008   Diabetes mellitus without complication ([place])    Hyperlipidemia    Hypertension     Past Surgical History:  Procedure Laterality Date   ABDOMINAL HYSTERECTOMY     APPENDECTOMY     BIOPSY  12/12/2020   Procedure: BIOPSY;  Surgeon: Eloise Harman, DO;  [facility];  Service: Endoscopy;;   COLONOSCOPY WITH PROPOFOL N/A 12/12/2020   Procedure: COLONOSCOPY WITH PROPOFOL;  Surgeon: Eloise Harman, DO;  [facility];  Service: Endoscopy;  Laterality: N/A;   ESOPHAGOGASTRODUODENOSCOPY (EGD) WITH PROPOFOL N/A 12/12/2020   Procedure: ESOPHAGOGASTRODUODENOSCOPY  (EGD) WITH PROPOFOL;  Surgeon: Eloise Harman, DO;  [facility];  Service: Endoscopy;  Laterality: N/A;   TONSILLECTOMY      Family History  Problem Relation Age of Onset   Breast cancer Sister        Deceased in her 1s    Current Outpatient Medications on File Prior to Visit  Medication Sig Dispense Refill   amLODipine (NORVASC) 5 MG tablet Take 1 tablet (5 mg total) by mouth daily.     anastrozole (ARIMIDEX) 1 MG tablet Take 1 tablet (1 mg total) by mouth daily. 30 tablet 3   APIXABAN (ELIQUIS) VTE STARTER PACK (10MG  AND 5MG ) Take as directed on package: start with two-5mg  tablets twice daily for 7 days. On day 8, switch to one-5mg  tablet twice daily. 1 tablet 0   cholecalciferol (VITAMIN D3) 25 MCG (1000 UNIT) tablet Take 1 tablet (1,000 Units total) by mouth daily. 90 tablet 3   ferrous sulfate 325 (65 FE) MG tablet Take 1 tablet (325 mg total) by mouth daily with breakfast.  3   HYDROmorphone (DILAUDID) 2 MG tablet Take 1 tablet (2 mg total) by mouth every 4 (four) hours as needed for severe pain. 60 tablet 0   LORazepam (ATIVAN) 0.5 MG tablet Take by mouth.     methocarbamol (ROBAXIN) 500 MG tablet Take 2 tablets by mouth 2 (two) times daily.     olmesartan (BENICAR) 40 MG tablet  Take 40 mg by mouth daily.     omeprazole (PRILOSEC) 20 MG capsule Take 1 capsule by mouth 2 (two) times daily.     ondansetron (ZOFRAN) 8 MG tablet Take by mouth.     oxyCODONE (ROXICODONE) 15 MG immediate release tablet Take 15 mg by mouth 4 (four) times daily as needed.     pantoprazole (PROTONIX) 40 MG tablet Take 1 tablet (40 mg total) by mouth 2 (two) times daily before a meal.     polyethylene glycol (MIRALAX / GLYCOLAX) 17 g packet Take 17 g by mouth daily. 14 each 0   prochlorperazine (COMPAZINE) 10 MG tablet Take by mouth.     venlafaxine XR (EFFEXOR-XR) 75 MG 24 hr capsule Take 75 mg by mouth daily.     No current facility-administered medications on file prior to visit.     Allergies  Allergen Reactions   Atenolol Other (See Comments)    Makes BP drop suddenly and feels lifeless    Social History   Substance and Sexual Activity  Alcohol Use Not Currently    Social History   Tobacco Use  Smoking Status Former   Packs/day: 1.50   Years: 7.00   Pack years: 10.50   Types: Cigarettes  Smokeless Tobacco Never  Tobacco Comments   quit 50 yrs ago    Review of Systems  Unable to perform ROS: Other   Objective   Vitals:   06/08/21 1109  BP: 107/73  Pulse: (!) 58  Resp: 12  Temp: 97.7 F (36.5 C)  SpO2: 98%    Physical Exam Vitals reviewed.  Constitutional:      Appearance: Normal appearance. She is normal weight. She is not ill-appearing.     Comments: Wheelchair-bound, somewhat frail in appearance.  Daughter present during examination.  HENT:     Head: Normocephalic and atraumatic.  Cardiovascular:     Rate and Rhythm: Normal rate and regular rhythm.     Heart sounds: Normal heart sounds. No murmur heard.   No friction rub. No gallop.  Pulmonary:     Effort: Pulmonary effort is normal. No respiratory distress.     Breath sounds: Normal breath sounds. No stridor. No wheezing or rales.  Skin:    General: Skin is warm and dry.  Neurological:     Mental Status: She is oriented to person, place, and time.  Breast: Large irregular dominant mass in the upper, outer quadrant of the left breast with thin skin overlying it.  It is somewhat mobile.  A large axillary lymph node is palpable.  There are petechiae of the skin in the left axilla.  Right breast examination unremarkable.  Media Information         Document Information  Photos    06/08/2021 11:22  Attached To:  Office Visit on 06/08/21 with Aviva Signs, MD   Source Information  Aviva Signs, MD  [facility]  Dr. Tomie China notes reviewed Assessment  Large left breast invasive ductal carcinoma, ER positive.  Multiple comorbidities including  history of pulmonary embolus requiring Eliquis treatment, deconditioning, history of anemia secondary to GI bleed. Patient is at increased risk for any surgical intervention.  I told her that I wanted her to get a plastic surgery consultation as I do not think I can do a primary modified radical mastectomy given the size of the tumor and skin involvement.  Given her social situation, radiation therapy for shrinkage is not a viable option.  She would like a prophylactic right  mastectomy, but I told her that that may not be indicated given her age and her comorbidities. Plan  Will refer to Dr. Marla Roe of plastic surgery for her input.  I have tried to temper her expectations as to any surgical results.  Further management is pending that consultation.

## 2021-06-23 ENCOUNTER — Ambulatory Visit (INDEPENDENT_AMBULATORY_CARE_PROVIDER_SITE_OTHER): Payer: PPO | Admitting: Plastic Surgery

## 2021-06-23 ENCOUNTER — Other Ambulatory Visit: Payer: Self-pay

## 2021-06-23 ENCOUNTER — Encounter: Payer: Self-pay | Admitting: Plastic Surgery

## 2021-06-23 VITALS — BP 103/67 | HR 115 | Ht 66.5 in | Wt 134.0 lb

## 2021-06-23 DIAGNOSIS — Z170 Estrogen receptor positive status [ER+]: Secondary | ICD-10-CM

## 2021-06-23 DIAGNOSIS — C50412 Malignant neoplasm of upper-outer quadrant of left female breast: Secondary | ICD-10-CM

## 2021-06-23 DIAGNOSIS — I2699 Other pulmonary embolism without acute cor pulmonale: Secondary | ICD-10-CM

## 2021-06-23 NOTE — Progress Notes (Signed)
We    Patient ID: Brittney Carroll, female    DOB: October 29, 1946, 75 y.o.   MRN: [id number]   Chief Complaint  Patient presents with   Breast Cancer    The patient is a 75 year old female here with her daughter for a consultation for breast reconstruction.  She was diagnosed with left breast cancer and referred to [place] by Dr. Arnoldo Morale.  The diagnosis came while in the hospital for treatment of anemia.  She also has a history of pulmonary embolus and was placed on Eliquis.  She is wheelchair-bound due to hips and hip pain and weakness.  The lesion is thought to be a 10 cm invasive ductal carcinoma of the upper outer quadrant of the left breast with mets to the lymph nodes.  It is estrogen positive.  She was started on Arimidex.  She was thinking about bilateral mastectomies for the fear of recurrence.  Her past medical history is positive for arthritis, hyperlipidemia, hypertension and diabetes.  Her past medical history is listed below and includes a hysterectomy, appendectomy and tonsillectomy.  She is 5 feet 6 inches tall and weighs 134 pounds.   Review of Systems  Constitutional:  Positive for activity change. Negative for appetite change.  Eyes: Negative.   Respiratory: Negative.  Negative for chest tightness and shortness of breath.   Cardiovascular: Negative.   Gastrointestinal: Negative.   Endocrine: Negative.   Genitourinary: Negative.   Musculoskeletal:  Positive for gait problem and joint swelling.  Skin:  Negative for wound.  Hematological:  Bruises/bleeds easily.   Past Medical History:  Diagnosis Date   Arthritis    Breast mass, left    Cancer [facility])    vaginal 2008   Diabetes mellitus without complication ([place])    Hyperlipidemia    Hypertension     Past Surgical History:  Procedure Laterality Date   ABDOMINAL HYSTERECTOMY     APPENDECTOMY     BIOPSY  12/12/2020   Procedure: BIOPSY;  Surgeon: Eloise Harman, DO;  [facility];  Service: Endoscopy;;    COLONOSCOPY WITH PROPOFOL N/A 12/12/2020   Procedure: COLONOSCOPY WITH PROPOFOL;  Surgeon: Eloise Harman, DO;  [facility];  Service: Endoscopy;  Laterality: N/A;   ESOPHAGOGASTRODUODENOSCOPY (EGD) WITH PROPOFOL N/A 12/12/2020   Procedure: ESOPHAGOGASTRODUODENOSCOPY (EGD) WITH PROPOFOL;  Surgeon: Eloise Harman, DO;  [facility];  Service: Endoscopy;  Laterality: N/A;   TONSILLECTOMY        Current Outpatient Medications:    amLODipine (NORVASC) 5 MG tablet, Take 1 tablet (5 mg total) by mouth daily., Disp: , Rfl:    anastrozole (ARIMIDEX) 1 MG tablet, Take 1 tablet (1 mg total) by mouth daily., Disp: 30 tablet, Rfl: 3   APIXABAN (ELIQUIS) VTE STARTER PACK (10MG  AND 5MG ), Take as directed on package: start with two-5mg  tablets twice daily for 7 days. On day 8, switch to one-5mg  tablet twice daily., Disp: 1 tablet, Rfl: 0   cholecalciferol (VITAMIN D3) 25 MCG (1000 UNIT) tablet, Take 1 tablet (1,000 Units total) by mouth daily., Disp: 90 tablet, Rfl: 3   ferrous sulfate 325 (65 FE) MG tablet, Take 1 tablet (325 mg total) by mouth daily with breakfast., Disp: , Rfl: 3   HYDROmorphone (DILAUDID) 2 MG tablet, Take 1 tablet (2 mg total) by mouth every 4 (four) hours as needed for severe pain., Disp: 60 tablet, Rfl: 0   LORazepam (ATIVAN) 0.5 MG tablet, Take by mouth., Disp: , Rfl:  methocarbamol (ROBAXIN) 500 MG tablet, Take 2 tablets by mouth 2 (two) times daily., Disp: , Rfl:    olmesartan (BENICAR) 40 MG tablet, Take 40 mg by mouth daily., Disp: , Rfl:    omeprazole (PRILOSEC) 20 MG capsule, Take 1 capsule by mouth 2 (two) times daily., Disp: , Rfl:    ondansetron (ZOFRAN) 8 MG tablet, Take by mouth., Disp: , Rfl:    oxyCODONE (ROXICODONE) 15 MG immediate release tablet, Take 15 mg by mouth 4 (four) times daily as needed., Disp: , Rfl:    pantoprazole (PROTONIX) 40 MG tablet, Take 1 tablet (40 mg total) by mouth 2 (two) times daily before a meal., Disp: , Rfl:     polyethylene glycol (MIRALAX / GLYCOLAX) 17 g packet, Take 17 g by mouth daily., Disp: 14 each, Rfl: 0   prochlorperazine (COMPAZINE) 10 MG tablet, Take by mouth., Disp: , Rfl:    venlafaxine XR (EFFEXOR-XR) 75 MG 24 hr capsule, Take 75 mg by mouth daily., Disp: , Rfl:    Objective:   Vitals:   06/23/21 0858  BP: 103/67  Pulse: (!) 115  SpO2: 95%    Physical Exam Vitals and nursing note reviewed.  Constitutional:      Appearance: Normal appearance.  HENT:     Head: Normocephalic and atraumatic.  Cardiovascular:     Rate and Rhythm: Normal rate.     Pulses: Normal pulses.  Pulmonary:     Effort: Pulmonary effort is normal.  Abdominal:     General: Abdomen is flat. There is no distension.  Skin:    Capillary Refill: Capillary refill takes less than 2 seconds.  Neurological:     Mental Status: She is alert.  Psychiatric:        Mood and Affect: Mood normal.        Behavior: Behavior normal.        Thought Content: Thought content normal.    Assessment & Plan:  Malignant neoplasm of upper-outer quadrant of left breast in female, estrogen receptor positive ([place])  Acute pulmonary embolism without acute cor pulmonale, unspecified pulmonary embolism type ([place])  A long discussion with the patient and her daughter regarding her treatment.  I also spoke with Dr. Arnoldo Morale about the following information.  There does not appear to be a decreased risk of recurrence by doing bilateral mastectomies.  The current recommendation is for left breast mastectomy.  Due to the location and size Dr. Arnoldo Morale is concerned that it may be difficult to close.  Therefore he was sending the patient to me.  I will work with Dr. Marlou Starks to do coordinated surgery for possible tissue rearrangement and closure.  There was no discussion about recreating breast.  I did tell them about second to nature and the opportunities for implants in the bra.  They seem comfortable with this plan and we will get it all  arranged.  Pictures were obtained of the patient and placed in the chart with the patient's or guardian's permission.   [place], DO

## 2021-07-03 DIAGNOSIS — I10 Essential (primary) hypertension: Secondary | ICD-10-CM | POA: Diagnosis not present

## 2021-07-03 DIAGNOSIS — K922 Gastrointestinal hemorrhage, unspecified: Secondary | ICD-10-CM | POA: Diagnosis not present

## 2021-07-03 DIAGNOSIS — C50919 Malignant neoplasm of unspecified site of unspecified female breast: Secondary | ICD-10-CM | POA: Diagnosis not present

## 2021-07-03 DIAGNOSIS — M25559 Pain in unspecified hip: Secondary | ICD-10-CM | POA: Diagnosis not present

## 2021-07-03 DIAGNOSIS — K5900 Constipation, unspecified: Secondary | ICD-10-CM | POA: Diagnosis not present

## 2021-07-19 ENCOUNTER — Other Ambulatory Visit: Payer: Self-pay

## 2021-07-19 ENCOUNTER — Inpatient Hospital Stay (HOSPITAL_COMMUNITY): Payer: PPO | Attending: Hematology | Admitting: Oncology

## 2021-07-19 VITALS — BP 130/58 | HR 97 | Temp 96.9°F | Resp 18 | Wt 134.8 lb

## 2021-07-19 DIAGNOSIS — Z170 Estrogen receptor positive status [ER+]: Secondary | ICD-10-CM

## 2021-07-19 DIAGNOSIS — C50412 Malignant neoplasm of upper-outer quadrant of left female breast: Secondary | ICD-10-CM

## 2021-07-19 MED ORDER — ONDANSETRON HCL 8 MG PO TABS: 8.0000 mg | ORAL_TABLET | Freq: Three times a day (TID) | ORAL | 0 refills | Status: DC | PRN

## 2021-07-19 NOTE — Progress Notes (Signed)
[facility] [address], [place] [address]   Patient Care Team: Celene Squibb, MD as PCP - General (Internal Medicine)  SUMMARY OF ONCOLOGIC HISTORY: Oncology History  Breast cancer of upper-outer quadrant of left female breast ([place])  02/24/2021 Initial Diagnosis   Breast cancer of upper-outer quadrant of left female breast ([place])    02/24/2021 Cancer Staging   Staging form: Breast, AJCC 8th Edition - Clinical stage from 02/24/2021: Stage IIIA (cT3, cN1(f), cM0, G3, ER+, PR+, HER2-) - Signed by Derek Jack, MD on 02/24/2021  Histopathologic type: Infiltrating duct carcinoma, NOS  Stage prefix: Initial diagnosis  Method of lymph node assessment: Core biopsy  Histologic grading system: 3 grade system      CHIEF COMPLIANT:  Follow-up for left breat cancer  I connected with Brittney Carroll on 07/19/21 at 11:00 AM EDT by video enabled telemedicine visit and verified that I am speaking with the correct person using two identifiers.   I discussed the limitations, risks, security and privacy concerns of performing an evaluation and management service by telemedicine and the availability of in-person appointments. I also discussed with the patient that there may be a patient responsible charge related to this service. The patient expressed understanding and agreed to proceed.   Other persons participating in the visit and their role in the encounter: RN, NP and patient    Patient's location: [place] location: [facility]    INTERVAL HISTORY: Brittney Carroll is a 74 y.o. female here today for follow up of her left breast cancer. Her last visit was on 02/24/2021.  In the interim, she was seen in the emergency room for constipation.  She apparently had a fecal impaction and was unable to get relief at her nursing home.  She tried enema and laxatives with worsening of her symptoms.  Unable to tolerate manual removal.  She was given magnesium citrate  and eventually had a bowel movement.  Today, she reports feeling "bad".  States she has nausea and she feels like "I am going to pass out".  Feels that her anxiety is contributing.  Her blood pressure stable.  She is taking Compazine for nausea.  Has allover pain but this is chronic for her.  No new pains. Has been taking her Arimidex as prescribed. Feels her breast mass has decreased in size.   REVIEW OF SYSTEMS:   Review of Systems  Constitutional:  Positive for fatigue.  Gastrointestinal:  Positive for constipation and nausea.  Neurological:  Positive for dizziness.  Psychiatric/Behavioral:  The patient is nervous/anxious.    I have reviewed the past medical history, past surgical history, social history and family history with the patient and they are unchanged from previous note.   ALLERGIES:   is allergic to atenolol.   MEDICATIONS:  Current Outpatient Medications  Medication Sig Dispense Refill   ondansetron (ZOFRAN) 8 MG tablet Take 1 tablet (8 mg total) by mouth every 8 (eight) hours as needed for nausea or vomiting. 20 tablet 0   amLODipine (NORVASC) 5 MG tablet Take 1 tablet (5 mg total) by mouth daily.     anastrozole (ARIMIDEX) 1 MG tablet Take 1 tablet (1 mg total) by mouth daily. 30 tablet 3   APIXABAN (ELIQUIS) VTE STARTER PACK (10MG AND 5MG) Take as directed on package: start with two-22m tablets twice daily for 7 days. On day 8, switch to one-564mtablet twice daily. 1 tablet 0   cholecalciferol (VITAMIN  D3) 25 MCG (1000 UNIT) tablet Take 1 tablet (1,000 Units total) by mouth daily. 90 tablet 3   ELIQUIS 5 MG TABS tablet Take 5 mg by mouth 2 (two) times daily.     ferrous sulfate 325 (65 FE) MG tablet Take 1 tablet (325 mg total) by mouth daily with breakfast.  3   HYDROmorphone (DILAUDID) 2 MG tablet Take 1 tablet (2 mg total) by mouth every 4 (four) hours as needed for severe pain. 60 tablet 0   LORazepam (ATIVAN) 0.5 MG tablet Take by mouth.     methocarbamol  (ROBAXIN) 500 MG tablet Take 2 tablets by mouth 2 (two) times daily.     olmesartan (BENICAR) 40 MG tablet Take 40 mg by mouth daily.     omeprazole (PRILOSEC) 20 MG capsule Take 1 capsule by mouth 2 (two) times daily.     ondansetron (ZOFRAN) 8 MG tablet Take by mouth.     oxyCODONE (ROXICODONE) 15 MG immediate release tablet Take 15 mg by mouth 4 (four) times daily as needed.     pantoprazole (PROTONIX) 40 MG tablet Take 1 tablet (40 mg total) by mouth 2 (two) times daily before a meal.     polyethylene glycol (MIRALAX / GLYCOLAX) 17 g packet Take 17 g by mouth daily. 14 each 0   prochlorperazine (COMPAZINE) 10 MG tablet Take by mouth.     venlafaxine XR (EFFEXOR-XR) 75 MG 24 hr capsule Take 75 mg by mouth daily.     No current facility-administered medications for this visit.     PHYSICAL EXAMINATION: Performance status (ECOG): 2 - Symptomatic, <50% confined to bed  Vitals:   07/19/21 1000  BP: (!) 130/58  Pulse: 97  Resp: 18  Temp: (!) 96.9 F (36.1 C)  SpO2: 95%   Wt Readings from Last 3 Encounters:  07/19/21 134 lb 12.8 oz (61.1 kg)  06/23/21 134 lb (60.8 kg)  06/08/21 196 lb (88.9 kg)   Physical Exam Constitutional:      Appearance: She is obese.     Comments: In wheelchair  HENT:     Head: Normocephalic.  Pulmonary:     Effort: Pulmonary effort is normal.  Chest:     Comments: Reports decrease in size of breast mass Neurological:     Mental Status: She is alert and oriented to person, place, and time.    Breast Exam Chaperone: Thana Ates     LABORATORY DATA:  I have reviewed the data as listed CMP Latest Ref Rng & Units 01/17/2021 12/09/2020  Glucose 70 - 99 mg/dL 145(H) 228(H)  BUN 8 - 23 mg/dL 18 19  Creatinine 0.44 - 1.00 mg/dL 0.96 0.79  Sodium 135 - 145 mmol/L 136 137  Potassium 3.5 - 5.1 mmol/L 4.3 3.7  Chloride 98 - 111 mmol/L 102 105  CO2 22 - 32 mmol/L 23 24  Calcium 8.9 - 10.3 mg/dL 9.5 9.3  Total Protein 6.5 - 8.1 g/dL - 6.8  Total  Bilirubin 0.3 - 1.2 mg/dL - 0.3  Alkaline Phos 38 - 126 U/L - 83  AST 15 - 41 U/L - 14(L)  ALT 0 - 44 U/L - 12   No results found for: [id number] Lab Results  Component Value Date   WBC 13.9 (H) 01/17/2021   HGB 12.2 01/17/2021   HCT 41.2 01/17/2021   MCV 81.9 01/17/2021   PLT 311 01/17/2021   NEUTROABS 10.3 (H) 01/17/2021    ASSESSMENT:  1.  Left breast mass in  upper outer quadrant: Brittney Carroll initially presented to the ER on 12/09/2020 with bleeding.  Work-up included CT AP with contrast on 12/09/2020 which showed a large mass in the breast.  No other concern of metastatic disease.  Had EGD which showed gastritis, nonbleeding duodenal ulcer, duodenitis with subsequent stomach biopsy showing mild chronic gastritis reactive gastropathy.  Colonoscopy showed nonbleeding internal hemorrhoids diverticulosis in the entire colon with blood in the transverse colon.  Has history of gynecology related cancer and underwent TAH in 2008. Reports growing left breast mass over the last 1 year.  Denies any weight loss. Left breast biopsy on 02/14/2021 with invasive ductal carcinoma, grade Left axillary lymph node biopsy consistent with metastatic carcinoma-ER/PR positive HER2/neu negative.  She had a bone scan on 01/23/2021 did not show any evidence of skeletal metastasis.  Had a CT chest on 01/23/2021 showed a small bilateral lung nodule.  PLAN:  T3N1 ER positive infiltrating ductal carcinoma of the left breast: She was initially reluctant to have a mastectomy but was agreeable at her last visit.  She was started on anastrozole.  She was referred to Dr. Arnoldo Morale for surgical consultation.  Patient requested bilateral mastectomy to decrease her risk for recurrence.  Given her comorbidities and age, Dr. Arnoldo Morale wanted input from plastic surgery.  He was unsure if he could do a primary modified radical mastectomy given the size of the tumor and skin involvement. Dr. Marla Roe was consulted who agreed that bilateral  mastectomies would not decrease her risk for recurrence and only recommended left breast mastectomy. Plan is for surgery on 08/02/21. She will continue anastrozole and see Dr. Rebecca Eaton about 6 weeks post surgery for follow-up with labs.   Nausea: Appears to be chronic. Was taking compazine without relief. Can try Zofran. Nausea was presents prior to starting anastrozole.   Constipation-stable for now.   PE- on eliquis BID. No bleeding reported.    Disposition- RTC in 6 weeks after surgery for follow-up with Dr. Raliegh Ip and labs.   Orders placed this encounter:  No orders of the defined types were placed in this encounter.  I spent 15 minutes dedicated to the care of this patient (face-to-face and non-face-to-face) on the date of the encounter to include what is described in the assessment and plan.  Faythe Casa, NP 07/19/2021 3:05 PM

## 2021-07-24 DIAGNOSIS — M25559 Pain in unspecified hip: Secondary | ICD-10-CM | POA: Diagnosis not present

## 2021-07-24 DIAGNOSIS — Z79899 Other long term (current) drug therapy: Secondary | ICD-10-CM | POA: Diagnosis not present

## 2021-07-24 DIAGNOSIS — Z170 Estrogen receptor positive status [ER+]: Secondary | ICD-10-CM | POA: Diagnosis not present

## 2021-07-24 DIAGNOSIS — G893 Neoplasm related pain (acute) (chronic): Secondary | ICD-10-CM | POA: Diagnosis not present

## 2021-07-24 DIAGNOSIS — C50919 Malignant neoplasm of unspecified site of unspecified female breast: Secondary | ICD-10-CM | POA: Diagnosis not present

## 2021-07-24 DIAGNOSIS — C50512 Malignant neoplasm of lower-outer quadrant of left female breast: Secondary | ICD-10-CM | POA: Diagnosis not present

## 2021-07-24 DIAGNOSIS — I10 Essential (primary) hypertension: Secondary | ICD-10-CM | POA: Diagnosis not present

## 2021-07-24 DIAGNOSIS — R52 Pain, unspecified: Secondary | ICD-10-CM | POA: Diagnosis not present

## 2021-08-02 ENCOUNTER — Other Ambulatory Visit (HOSPITAL_COMMUNITY): Payer: PPO

## 2021-08-02 ENCOUNTER — Ambulatory Visit (HOSPITAL_COMMUNITY): Payer: PPO | Admitting: Hematology

## 2021-08-03 DIAGNOSIS — F330 Major depressive disorder, recurrent, mild: Secondary | ICD-10-CM | POA: Diagnosis not present

## 2021-08-04 ENCOUNTER — Ambulatory Visit: Payer: Self-pay | Admitting: General Surgery

## 2021-08-04 DIAGNOSIS — C50412 Malignant neoplasm of upper-outer quadrant of left female breast: Secondary | ICD-10-CM | POA: Diagnosis not present

## 2021-08-04 DIAGNOSIS — Z170 Estrogen receptor positive status [ER+]: Secondary | ICD-10-CM | POA: Diagnosis not present

## 2021-08-10 DIAGNOSIS — M25559 Pain in unspecified hip: Secondary | ICD-10-CM | POA: Diagnosis not present

## 2021-08-10 DIAGNOSIS — Z79899 Other long term (current) drug therapy: Secondary | ICD-10-CM | POA: Diagnosis not present

## 2021-08-10 DIAGNOSIS — R52 Pain, unspecified: Secondary | ICD-10-CM | POA: Diagnosis not present

## 2021-08-10 DIAGNOSIS — C50512 Malignant neoplasm of lower-outer quadrant of left female breast: Secondary | ICD-10-CM | POA: Diagnosis not present

## 2021-08-10 DIAGNOSIS — G893 Neoplasm related pain (acute) (chronic): Secondary | ICD-10-CM | POA: Diagnosis not present

## 2021-08-10 DIAGNOSIS — C50919 Malignant neoplasm of unspecified site of unspecified female breast: Secondary | ICD-10-CM | POA: Diagnosis not present

## 2021-08-14 DIAGNOSIS — R52 Pain, unspecified: Secondary | ICD-10-CM | POA: Diagnosis not present

## 2021-08-14 DIAGNOSIS — I10 Essential (primary) hypertension: Secondary | ICD-10-CM | POA: Diagnosis not present

## 2021-08-14 DIAGNOSIS — G893 Neoplasm related pain (acute) (chronic): Secondary | ICD-10-CM | POA: Diagnosis not present

## 2021-08-14 DIAGNOSIS — C50919 Malignant neoplasm of unspecified site of unspecified female breast: Secondary | ICD-10-CM | POA: Diagnosis not present

## 2021-08-14 DIAGNOSIS — C50512 Malignant neoplasm of lower-outer quadrant of left female breast: Secondary | ICD-10-CM | POA: Diagnosis not present

## 2021-08-14 DIAGNOSIS — Z79899 Other long term (current) drug therapy: Secondary | ICD-10-CM | POA: Diagnosis not present

## 2021-08-14 DIAGNOSIS — D649 Anemia, unspecified: Secondary | ICD-10-CM | POA: Diagnosis not present

## 2021-08-14 DIAGNOSIS — R4689 Other symptoms and signs involving appearance and behavior: Secondary | ICD-10-CM | POA: Diagnosis not present

## 2021-08-17 ENCOUNTER — Ambulatory Visit (INDEPENDENT_AMBULATORY_CARE_PROVIDER_SITE_OTHER): Payer: PPO | Admitting: Cardiology

## 2021-08-17 ENCOUNTER — Other Ambulatory Visit: Payer: Self-pay

## 2021-08-17 ENCOUNTER — Encounter: Payer: Self-pay | Admitting: Cardiology

## 2021-08-17 VITALS — BP 130/72 | HR 116 | Ht 66.5 in | Wt 137.0 lb

## 2021-08-17 DIAGNOSIS — R0602 Shortness of breath: Secondary | ICD-10-CM | POA: Diagnosis not present

## 2021-08-17 DIAGNOSIS — Z01818 Encounter for other preprocedural examination: Secondary | ICD-10-CM | POA: Diagnosis not present

## 2021-08-17 DIAGNOSIS — I10 Essential (primary) hypertension: Secondary | ICD-10-CM | POA: Diagnosis not present

## 2021-08-17 NOTE — Progress Notes (Signed)
Cardiology Office Note:    Date:  08/17/2021   ID:  Brittney Carroll, DOB 03/12/1946, MRN [id number]  PCP:  Celene Squibb, MD   [facility] Providers Cardiologist:  None     Referring MD: Celene Squibb, MD   Chief Complaint  Patient presents with   Other   New Patient (Initial Visit)    New patient for Cardiac Clearance. Patient c.o SOB. Meds reviewed verbally with patient.    Brittney Carroll is a 75 y.o. female who is being seen today for the evaluation of preop evaluation at the request of Celene Squibb, MD.   History of Present Illness:    Brittney Carroll is a 75 y.o. female with a hx of hypertension, hyperlipidemia, diabetes, PE on Eliquis, recently diagnosed left breast cancer who presents for preop evaluation.  Patient diagnosed with breast cancer about 6 months ago, left mastectomy is being planned.  She has chronic pain in her arms and hips, has shortness of breath with minimal exertion in bed.  She is nonambulatory, lives in a skilled nursing facility, needs assistance with movements.  Denies chest pain, palpitations.  Is on chronic Eliquis due to PE.  She states not being able to lay flat on her back due to hip and back pains.  Past Medical History:  Diagnosis Date   Arthritis    Breast mass, left    Cancer [facility])    vaginal 2008   Diabetes mellitus without complication ([place])    Hyperlipidemia    Hypertension     Past Surgical History:  Procedure Laterality Date   ABDOMINAL HYSTERECTOMY     APPENDECTOMY     BIOPSY  12/12/2020   Procedure: BIOPSY;  Surgeon: Eloise Harman, DO;  [facility];  Service: Endoscopy;;   COLONOSCOPY WITH PROPOFOL N/A 12/12/2020   Procedure: COLONOSCOPY WITH PROPOFOL;  Surgeon: Eloise Harman, DO;  [facility];  Service: Endoscopy;  Laterality: N/A;   ESOPHAGOGASTRODUODENOSCOPY (EGD) WITH PROPOFOL N/A 12/12/2020   Procedure: ESOPHAGOGASTRODUODENOSCOPY (EGD) WITH PROPOFOL;  Surgeon: Eloise Harman, DO;  [facility];  Service: Endoscopy;  Laterality: N/A;   TONSILLECTOMY      Current Medications: Current Meds  Medication Sig   amLODipine (NORVASC) 5 MG tablet Take 1 tablet (5 mg total) by mouth daily.   anastrozole (ARIMIDEX) 1 MG tablet Take 1 tablet (1 mg total) by mouth daily.   cholecalciferol (VITAMIN D3) 25 MCG (1000 UNIT) tablet Take 1 tablet (1,000 Units total) by mouth daily.   ELIQUIS 5 MG TABS tablet Take 5 mg by mouth 2 (two) times daily.   ferrous sulfate 325 (65 FE) MG tablet Take 1 tablet (325 mg total) by mouth daily with breakfast.   HYDROmorphone (DILAUDID) 2 MG tablet Take 1 tablet (2 mg total) by mouth every 4 (four) hours as needed for severe pain.   LORazepam (ATIVAN) 0.5 MG tablet Take by mouth.   methocarbamol (ROBAXIN) 500 MG tablet Take 2 tablets by mouth 2 (two) times daily.   olmesartan (BENICAR) 40 MG tablet Take 40 mg by mouth daily.   omeprazole (PRILOSEC) 20 MG capsule Take 1 capsule by mouth 2 (two) times daily.   ondansetron (ZOFRAN) 8 MG tablet Take by mouth.   ondansetron (ZOFRAN) 8 MG tablet Take 1 tablet (8 mg total) by mouth every 8 (eight) hours as needed for nausea or vomiting.   oxyCODONE (ROXICODONE) 15 MG immediate release tablet Take 15  mg by mouth 4 (four) times daily as needed.   pantoprazole (PROTONIX) 40 MG tablet Take 1 tablet (40 mg total) by mouth 2 (two) times daily before a meal.   polyethylene glycol (MIRALAX / GLYCOLAX) 17 g packet Take 17 g by mouth daily.   prochlorperazine (COMPAZINE) 10 MG tablet Take by mouth.   venlafaxine XR (EFFEXOR-XR) 75 MG 24 hr capsule Take 75 mg by mouth daily.     Allergies:   Atenolol   Social History   Socioeconomic History   Marital status: Divorced    Spouse name: Not on file   Number of children: Not on file   Years of education: Not on file   Highest education level: Not on file  Occupational History   Not on file  Tobacco Use   Smoking status: Former    Packs/day: 1.50    Years:  7.00    Pack years: 10.50    Types: Cigarettes   Smokeless tobacco: Never   Tobacco comments:    quit 50 yrs ago  Vaping Use   Vaping Use: Never used  Substance and Sexual Activity   Alcohol use: Not Currently   Drug use: Never   Sexual activity: Not on file  Other Topics Concern   Not on file  Social History Narrative   Not on file   Social Determinants of Health   Financial Resource Strain: Medium Risk   Difficulty of Paying Living Expenses: Somewhat hard  Food Insecurity: No Food Insecurity   Worried About Charity fundraiser in the Last Year: Never true   Ran Out of Food in the Last Year: Never true  Transportation Needs: Unmet Transportation Needs   Lack of Transportation (Medical): Yes   Lack of Transportation (Non-Medical): Yes  Physical Activity: Inactive   Days of Exercise per Week: 0 days   Minutes of Exercise per Session: 0 min  Stress: Stress Concern Present   Feeling of Stress : To some extent  Social Connections: Socially Isolated   Frequency of Communication with Friends and Family: Once a week   Frequency of Social Gatherings with Friends and Family: Once a week   Attends Religious Services: 1 to 4 times per year   Active Member of [employer] or Organizations: No   Attends Archivist Meetings: Never   Marital Status: Divorced     Family History: The patient's family history includes Breast cancer in her sister.  ROS:   Please see the history of present illness.     All other systems reviewed and are negative.  EKGs/Labs/Other Studies Reviewed:    The following studies were reviewed today:   EKG:  EKG is  ordered today.  The ekg ordered today demonstrates sinus tachycardia, possible old inferior and septal infarct.  Recent Labs: 12/09/2020: ALT 12 01/17/2021: BUN 18; Creatinine, Ser 0.96; Hemoglobin 12.2; Platelets 311; Potassium 4.3; Sodium 136  Recent Lipid Panel No results found for: CHOL, TRIG, HDL, CHOLHDL, VLDL, LDLCALC,  LDLDIRECT   Risk Assessment/Calculations:          Physical Exam:    VS:  BP 130/72 (BP Location: Right Arm, Patient Position: Sitting, Cuff Size: Normal)   Pulse (!) 116   Ht 5' 6.5" (1.689 m)   Wt 137 lb (62.1 kg)   BMI 21.78 kg/m     Wt Readings from Last 3 Encounters:  08/17/21 137 lb (62.1 kg)  07/19/21 134 lb 12.8 oz (61.1 kg)  06/23/21 134 lb (60.8 kg)  GEN: Appears weak, sitting in wheelchair HEENT: Hard of hearing NECK: No JVD; No carotid bruits LYMPHATICS: No lymphadenopathy CARDIAC: RRR, no murmurs, rubs, gallops RESPIRATORY:  Clear to auscultation without rales, wheezing or rhonchi  ABDOMEN: Soft, non-tender, non-distended MUSCULOSKELETAL:  No edema; No deformity  SKIN: Warm and dry NEUROLOGIC:  Alert and oriented x 3 PSYCHIATRIC:  Normal affect   ASSESSMENT:    1. Pre-op evaluation   2. Primary hypertension   3. Shortness of breath    PLAN:    In order of problems listed above:  Preop evaluation prior to possible left mastectomy.  Patient is extremely deconditioned, wheelchair-bound, not able to ambulate or get out of bed without help.  Not able to lay flat on her back due to chronic hip and back pains, as such any form of cardiac stress testing is not possible.  We will plan to obtain echocardiogram to evaluate systolic and diastolic function.  Overall patient is at least moderate  risk for surgical procedure.  Will obtain echocardiogram to evaluate systolic and diastolic function.  If EF is normal, patient will stay at moderate risk.  If EF is reduced, this will elevate her risk to high. Hypertension, controlled continue current BP meds. Shortness of breath with minimal exertion, likely due to deconditioning.  Echocardiogram as above.  Follow-up after echo     Medication Adjustments/Labs and Tests Ordered: Current medicines are reviewed at length with the patient today.  Concerns regarding medicines are outlined above.  Orders Placed This  Encounter  Procedures   EKG 12-Lead   ECHOCARDIOGRAM COMPLETE    No orders of the defined types were placed in this encounter.   Patient Instructions  Medication Instructions:  Your physician recommends that you continue on your current medications as directed. Please refer to the Current Medication list given to you today.  *If you need a refill on your cardiac medications before your next appointment, please call your pharmacy*   Lab Work: None ordered If you have labs (blood work) drawn today and your tests are completely normal, you will receive your results only by: [place] (if you have MyChart) OR A paper copy in the mail If you have any lab test that is abnormal or we need to change your treatment, we will call you to review the results.   Testing/Procedures:  Your physician has requested that you have an echocardiogram. Echocardiography is a painless test that uses sound waves to create images of your heart. It provides your doctor with information about the size and shape of your heart and how well your heart's chambers and valves are working. This procedure takes approximately one hour. There are no restrictions for this procedure.    Follow-Up: At [facility], you and your health needs are our priority.  As part of our continuing mission to provide you with exceptional heart care, we have created designated Provider Care Teams.  These Care Teams include your primary Cardiologist (physician) and Advanced Practice Providers (APPs -  Physician Assistants and Nurse Practitioners) who all work together to provide you with the care you need, when you need it.  We recommend signing up for the patient portal called "MyChart".  Sign up information is provided on this After Visit Summary.  MyChart is used to connect with patients for Virtual Visits (Telemedicine).  Patients are able to view lab/test results, encounter notes, upcoming appointments, etc.  Non-urgent  messages can be sent to your provider as well.   To learn more  about what you can do with MyChart, go to [email or web address].    Your next appointment:   Follow up after Echo   The format for your next appointment:   In Person  Provider:   You may see Dr. Garen Lah or one of the following Advanced Practice Providers on your designated Care Team:   Murray Hodgkins, NP Christell Faith, PA-C Marrianne Mood, PA-C Cadence Deepstep, [place]   Other Instructions    Signed, Kate Sable, MD  08/17/2021 10:15 AM    [place]

## 2021-08-17 NOTE — Patient Instructions (Signed)
Medication Instructions:  Your physician recommends that you continue on your current medications as directed. Please refer to the Current Medication list given to you today.  *If you need a refill on your cardiac medications before your next appointment, please call your pharmacy*   Lab Work: None ordered If you have labs (blood work) drawn today and your tests are completely normal, you will receive your results only by: [place] (if you have MyChart) OR A paper copy in the mail If you have any lab test that is abnormal or we need to change your treatment, we will call you to review the results.   Testing/Procedures:  Your physician has requested that you have an echocardiogram. Echocardiography is a painless test that uses sound waves to create images of your heart. It provides your doctor with information about the size and shape of your heart and how well your heart's chambers and valves are working. This procedure takes approximately one hour. There are no restrictions for this procedure.    Follow-Up: At [facility], you and your health needs are our priority.  As part of our continuing mission to provide you with exceptional heart care, we have created designated Provider Care Teams.  These Care Teams include your primary Cardiologist (physician) and Advanced Practice Providers (APPs -  Physician Assistants and Nurse Practitioners) who all work together to provide you with the care you need, when you need it.  We recommend signing up for the patient portal called "MyChart".  Sign up information is provided on this After Visit Summary.  MyChart is used to connect with patients for Virtual Visits (Telemedicine).  Patients are able to view lab/test results, encounter notes, upcoming appointments, etc.  Non-urgent messages can be sent to your provider as well.   To learn more about what you can do with MyChart, go to [email or web address].    Your next appointment:   Follow  up after Echo   The format for your next appointment:   In Person  Provider:   You may see Dr. Garen Lah or one of the following Advanced Practice Providers on your designated Care Team:   Murray Hodgkins, NP Christell Faith, PA-C Marrianne Mood, PA-C Cadence Kathlen Mody, [place]   Other Instructions

## 2021-08-18 ENCOUNTER — Other Ambulatory Visit: Payer: Self-pay | Admitting: Internal Medicine

## 2021-08-21 DIAGNOSIS — R52 Pain, unspecified: Secondary | ICD-10-CM | POA: Diagnosis not present

## 2021-08-21 DIAGNOSIS — Z170 Estrogen receptor positive status [ER+]: Secondary | ICD-10-CM | POA: Diagnosis not present

## 2021-08-21 DIAGNOSIS — M25559 Pain in unspecified hip: Secondary | ICD-10-CM | POA: Diagnosis not present

## 2021-08-21 DIAGNOSIS — C50512 Malignant neoplasm of lower-outer quadrant of left female breast: Secondary | ICD-10-CM | POA: Diagnosis not present

## 2021-08-21 DIAGNOSIS — G893 Neoplasm related pain (acute) (chronic): Secondary | ICD-10-CM | POA: Diagnosis not present

## 2021-08-21 DIAGNOSIS — C50919 Malignant neoplasm of unspecified site of unspecified female breast: Secondary | ICD-10-CM | POA: Diagnosis not present

## 2021-08-21 DIAGNOSIS — Z79899 Other long term (current) drug therapy: Secondary | ICD-10-CM | POA: Diagnosis not present

## 2021-08-23 ENCOUNTER — Encounter (HOSPITAL_COMMUNITY): Admission: RE | Payer: Self-pay | Source: Home / Self Care

## 2021-08-23 ENCOUNTER — Ambulatory Visit (HOSPITAL_COMMUNITY): Admission: RE | Admit: 2021-08-23 | Payer: PPO | Source: Home / Self Care | Admitting: General Surgery

## 2021-08-23 SURGERY — MODIFIED MASTECTOMY
Anesthesia: General | Site: Breast | Laterality: Left

## 2021-08-29 DIAGNOSIS — D649 Anemia, unspecified: Secondary | ICD-10-CM | POA: Diagnosis not present

## 2021-08-29 DIAGNOSIS — M25559 Pain in unspecified hip: Secondary | ICD-10-CM | POA: Diagnosis not present

## 2021-08-29 DIAGNOSIS — K219 Gastro-esophageal reflux disease without esophagitis: Secondary | ICD-10-CM | POA: Diagnosis not present

## 2021-08-30 DIAGNOSIS — F330 Major depressive disorder, recurrent, mild: Secondary | ICD-10-CM | POA: Diagnosis not present

## 2021-09-05 ENCOUNTER — Ambulatory Visit: Payer: PPO

## 2021-09-06 DIAGNOSIS — D509 Iron deficiency anemia, unspecified: Secondary | ICD-10-CM | POA: Diagnosis not present

## 2021-09-06 DIAGNOSIS — B9620 Unspecified Escherichia coli [E. coli] as the cause of diseases classified elsewhere: Secondary | ICD-10-CM | POA: Diagnosis not present

## 2021-09-06 DIAGNOSIS — N390 Urinary tract infection, site not specified: Secondary | ICD-10-CM | POA: Diagnosis not present

## 2021-09-06 DIAGNOSIS — N184 Chronic kidney disease, stage 4 (severe): Secondary | ICD-10-CM | POA: Diagnosis not present

## 2021-09-06 NOTE — Progress Notes (Signed)
Nurse Verline Lema from [facility] informed that the pt may or may not have her surgery on Fri 09/08/21, and if so they will be contacted and the instructions will be faxed to them. Main number: [phone] fax: [phone].

## 2021-09-06 NOTE — Progress Notes (Signed)
Call returned from Dr. Ethlyn Gallery office, and they were under the impression that the pt was not taking Eliquis at this time. Informed the office that as of yesterday, 09/05/21, her current medication list from [facility] has her taking Eliquis 5mg  twice a day. The office states they will contact the pt, and call back.

## 2021-09-06 NOTE — Progress Notes (Signed)
Call placed to [facility] to attempt a pre-op phone call. Awaiting the nurse to call back.

## 2021-09-06 NOTE — Progress Notes (Signed)
Nurse Verline Lema from [facility] returned the call, and states that she was unaware that the pt was having surgery. She states the pt is AAOx4. Verline Lema also confirmed that the pt was still taking Eliquis, and that she had already received a dose today at 0900. Verline Lema made aware that Dr. Ethlyn Gallery office will be notified, and then she will be notified if the pt will still have surgery on Fri 09/08/21 as planned.

## 2021-09-06 NOTE — Progress Notes (Signed)
Brittney Carroll from Dr. Ethlyn Gallery office made aware that the pt was still taking Eliquis and had received a dose today. Brittney Carroll went on to say that this pt should have not been put on the surgery list due to her not being cleared by cardiology, due to her needing an Echo, which was not performed yet. Furthermore, Brittney Carroll states she will inform Dr. Marlou Starks in the morning to get the final word.

## 2021-09-06 NOTE — Progress Notes (Signed)
Call placed to [facility] regarding Eliquis, per the triage nurse she will contact Dr. Marlou Starks, as there are no instructions when to hold prior to surgery.

## 2021-09-07 DIAGNOSIS — K219 Gastro-esophageal reflux disease without esophagitis: Secondary | ICD-10-CM | POA: Diagnosis not present

## 2021-09-07 DIAGNOSIS — G893 Neoplasm related pain (acute) (chronic): Secondary | ICD-10-CM | POA: Diagnosis not present

## 2021-09-07 DIAGNOSIS — Z170 Estrogen receptor positive status [ER+]: Secondary | ICD-10-CM | POA: Diagnosis not present

## 2021-09-07 DIAGNOSIS — N390 Urinary tract infection, site not specified: Secondary | ICD-10-CM | POA: Diagnosis not present

## 2021-09-07 DIAGNOSIS — R300 Dysuria: Secondary | ICD-10-CM | POA: Diagnosis not present

## 2021-09-07 DIAGNOSIS — R52 Pain, unspecified: Secondary | ICD-10-CM | POA: Diagnosis not present

## 2021-09-07 DIAGNOSIS — C50512 Malignant neoplasm of lower-outer quadrant of left female breast: Secondary | ICD-10-CM | POA: Diagnosis not present

## 2021-09-07 DIAGNOSIS — R319 Hematuria, unspecified: Secondary | ICD-10-CM | POA: Diagnosis not present

## 2021-09-08 ENCOUNTER — Ambulatory Visit (HOSPITAL_COMMUNITY): Admission: RE | Admit: 2021-09-08 | Payer: PPO | Source: Home / Self Care | Admitting: General Surgery

## 2021-09-08 ENCOUNTER — Emergency Department (HOSPITAL_COMMUNITY)
Admission: EM | Admit: 2021-09-08 | Discharge: 2021-09-09 | Disposition: A | Discharge status: Home / Self Care | Payer: Medicare Other | Attending: Emergency Medicine | Admitting: Emergency Medicine

## 2021-09-08 ENCOUNTER — Other Ambulatory Visit: Payer: Self-pay

## 2021-09-08 ENCOUNTER — Encounter (HOSPITAL_COMMUNITY): Payer: Self-pay | Admitting: Emergency Medicine

## 2021-09-08 ENCOUNTER — Encounter (HOSPITAL_COMMUNITY): Admission: RE | Payer: Self-pay | Source: Home / Self Care

## 2021-09-08 DIAGNOSIS — I10 Essential (primary) hypertension: Secondary | ICD-10-CM | POA: Diagnosis not present

## 2021-09-08 DIAGNOSIS — Z87891 Personal history of nicotine dependence: Secondary | ICD-10-CM | POA: Diagnosis not present

## 2021-09-08 DIAGNOSIS — R1084 Generalized abdominal pain: Secondary | ICD-10-CM | POA: Diagnosis not present

## 2021-09-08 DIAGNOSIS — E119 Type 2 diabetes mellitus without complications: Secondary | ICD-10-CM | POA: Diagnosis not present

## 2021-09-08 DIAGNOSIS — K5641 Fecal impaction: Secondary | ICD-10-CM | POA: Insufficient documentation

## 2021-09-08 DIAGNOSIS — R109 Unspecified abdominal pain: Secondary | ICD-10-CM | POA: Diagnosis not present

## 2021-09-08 DIAGNOSIS — R4182 Altered mental status, unspecified: Secondary | ICD-10-CM | POA: Insufficient documentation

## 2021-09-08 DIAGNOSIS — Z8544 Personal history of malignant neoplasm of other female genital organs: Secondary | ICD-10-CM | POA: Insufficient documentation

## 2021-09-08 DIAGNOSIS — R112 Nausea with vomiting, unspecified: Secondary | ICD-10-CM | POA: Diagnosis present

## 2021-09-08 DIAGNOSIS — R1110 Vomiting, unspecified: Secondary | ICD-10-CM | POA: Diagnosis not present

## 2021-09-08 DIAGNOSIS — E860 Dehydration: Secondary | ICD-10-CM | POA: Insufficient documentation

## 2021-09-08 DIAGNOSIS — W19XXXA Unspecified fall, initial encounter: Secondary | ICD-10-CM | POA: Diagnosis not present

## 2021-09-08 LAB — COMPREHENSIVE METABOLIC PANEL
ALT: 13 U/L (ref 0–44)
AST: 19 U/L (ref 15–41)
Albumin: 3.9 g/dL (ref 3.5–5.0)
Alkaline Phosphatase: 98 U/L (ref 38–126)
Anion gap: 11 (ref 5–15)
BUN: 19 mg/dL (ref 8–23)
CO2: 24 mmol/L (ref 22–32)
Calcium: 9.3 mg/dL (ref 8.9–10.3)
Chloride: 102 mmol/L (ref 98–111)
Creatinine, Ser: 0.76 mg/dL (ref 0.44–1.00)
GFR, Estimated: >60 mL/min (ref >60)
Glucose, Bld: 286 mg/dL — ABNORMAL HIGH (ref 70–99)
Potassium: 3.3 mmol/L — ABNORMAL LOW (ref 3.5–5.1)
Sodium: 137 mmol/L (ref 135–145)
Total Bilirubin: 0.6 mg/dL (ref 0.3–1.2)
Total Protein: 7.3 g/dL (ref 6.5–8.1)

## 2021-09-08 LAB — CBC
HCT: 39.6 % (ref 36.0–46.0)
Hemoglobin: 12.7 g/dL (ref 12.0–15.0)
MCH: 28.6 pg (ref 26.0–34.0)
MCHC: 32.1 g/dL (ref 30.0–36.0)
MCV: 89.2 fL (ref 80.0–100.0)
Platelets: 407 10*3/uL — ABNORMAL HIGH (ref 150–400)
RBC: 4.44 MIL/uL (ref 3.87–5.11)
RDW: 12.6 % (ref 11.5–15.5)
WBC: 14.8 10*3/uL — ABNORMAL HIGH (ref 4.0–10.5)
nRBC: 0 % (ref 0.0–0.2)

## 2021-09-08 LAB — LIPASE, BLOOD: Lipase: 18 U/L (ref 11–51)

## 2021-09-08 SURGERY — MODIFIED MASTECTOMY
Anesthesia: General | Site: Breast | Laterality: Left

## 2021-09-08 MED ORDER — ONDANSETRON HCL 4 MG/2ML IJ SOLN
4.0000 mg | Freq: Once | INTRAMUSCULAR | Status: AC
  Administered 2021-09-09: 4 mg via INTRAVENOUS
  Filled 2021-09-08: qty 2

## 2021-09-08 MED ORDER — SODIUM CHLORIDE 0.9 % IV BOLUS
1000.0000 mL | Freq: Once | INTRAVENOUS | Status: AC
  Administered 2021-09-09: 1000 mL via INTRAVENOUS

## 2021-09-08 NOTE — ED Triage Notes (Signed)
Pt brought in by [facility] from [facility] with c/o vomiting "since noon today". Per EMS, pt was given Phenergan IM and Zofran po by the NH prior to EMS arrival.

## 2021-09-08 NOTE — ED Notes (Signed)
Pt has not vomited since arrival- pt says she is ready to go home- pt on telephone with daughter at this time.

## 2021-09-08 NOTE — ED Notes (Signed)
Per EMS, pt has beast CA and is on Hospice. EMS also states facility thinks pt may have a UTI as well.

## 2021-09-08 NOTE — ED Notes (Signed)
Pt alert and oriented to place and year, not to age or month.  Pt states she is 63 and the month is June.

## 2021-09-09 ENCOUNTER — Emergency Department (HOSPITAL_COMMUNITY): Payer: Medicare Other

## 2021-09-09 DIAGNOSIS — R4182 Altered mental status, unspecified: Secondary | ICD-10-CM | POA: Diagnosis not present

## 2021-09-09 DIAGNOSIS — Z7401 Bed confinement status: Secondary | ICD-10-CM | POA: Diagnosis not present

## 2021-09-09 DIAGNOSIS — R1110 Vomiting, unspecified: Secondary | ICD-10-CM | POA: Diagnosis not present

## 2021-09-09 DIAGNOSIS — I10 Essential (primary) hypertension: Secondary | ICD-10-CM | POA: Diagnosis not present

## 2021-09-09 DIAGNOSIS — R279 Unspecified lack of coordination: Secondary | ICD-10-CM | POA: Diagnosis not present

## 2021-09-09 DIAGNOSIS — R109 Unspecified abdominal pain: Secondary | ICD-10-CM | POA: Diagnosis not present

## 2021-09-09 DIAGNOSIS — E860 Dehydration: Secondary | ICD-10-CM | POA: Diagnosis not present

## 2021-09-09 DIAGNOSIS — K5641 Fecal impaction: Secondary | ICD-10-CM | POA: Diagnosis not present

## 2021-09-09 LAB — URINALYSIS, ROUTINE W REFLEX MICROSCOPIC
Bilirubin Urine: NEGATIVE
Glucose, UA: >=500 mg/dL — AB
Ketones, ur: 20 mg/dL — AB
Leukocytes,Ua: NEGATIVE
Nitrite: POSITIVE — AB
Protein, ur: NEGATIVE mg/dL
Specific Gravity, Urine: 1.022 (ref 1.005–1.030)
pH: 7 (ref 5.0–8.0)

## 2021-09-09 MED ORDER — HYDROMORPHONE HCL 1 MG/ML IJ SOLN
1.0000 mg | Freq: Once | INTRAMUSCULAR | Status: DC
Start: 2021-09-09 — End: 2021-09-09
  Filled 2021-09-09: qty 1

## 2021-09-09 MED ORDER — HYDROMORPHONE HCL 1 MG/ML IJ SOLN
1.0000 mg | Freq: Once | INTRAMUSCULAR | Status: AC
  Administered 2021-09-09: 1 mg via INTRAMUSCULAR

## 2021-09-09 MED ORDER — IOHEXOL 300 MG/ML  SOLN
100.0000 mL | Freq: Once | INTRAMUSCULAR | Status: AC | PRN
  Administered 2021-09-09: 100 mL via INTRAVENOUS

## 2021-09-09 MED ORDER — SODIUM CHLORIDE 0.9 % IV BOLUS
1000.0000 mL | Freq: Once | INTRAVENOUS | Status: AC
  Administered 2021-09-09: 1000 mL via INTRAVENOUS

## 2021-09-09 NOTE — ED Notes (Signed)
Patient transported to CT 

## 2021-09-09 NOTE — ED Notes (Signed)
Fecal impaction done by Dr Sedonia Small, asissted by this nurse, large amount of dark brown solid stool removed.

## 2021-09-09 NOTE — ED Notes (Signed)
Attempted to call [facility], Left on hold for 10 minutes. Had prior communications with an hour ago letting them know she will be discharged after urine comes back. Urine has resulted, normal results, patient plan of care has not changed. Will be discharged back to facility.   Report Must be given to Elmyra Ricks at [phone]   Must be transported by EMS

## 2021-09-09 NOTE — ED Provider Notes (Signed)
[facility] Emergency Department Provider Note MRN:  [id number]  Arrival date & time: 09/09/21     Chief Complaint   Emesis   History of Present Illness   Brittney Carroll is a 75 y.o. year-old female with a history of breast cancer, diabetes, hypertension presenting to the ED with chief complaint of emesis.  2 days of nausea and vomiting.  Also endorsing some diffuse abdominal discomfort.  Mild to moderate in severity.  Denies headache or vision change, no chest pain or shortness of breath.  Patient seems confused.  I was unable to obtain an accurate HPI, PMH, or ROS due to the patient's altered mental status.  Level 5 caveat.  Review of Systems  Positive for abdominal pain, nausea vomiting, altered mental status.  Patient's Health History    Past Medical History:  Diagnosis Date   Arthritis    Breast mass, left    Cancer [facility])    vaginal 2008   Diabetes mellitus without complication ([place])    Hyperlipidemia    Hypertension     Past Surgical History:  Procedure Laterality Date   ABDOMINAL HYSTERECTOMY     APPENDECTOMY     BIOPSY  12/12/2020   Procedure: BIOPSY;  Surgeon: Eloise Harman, DO;  [facility];  Service: Endoscopy;;   COLONOSCOPY WITH PROPOFOL N/A 12/12/2020   Procedure: COLONOSCOPY WITH PROPOFOL;  Surgeon: Eloise Harman, DO;  [facility];  Service: Endoscopy;  Laterality: N/A;   ESOPHAGOGASTRODUODENOSCOPY (EGD) WITH PROPOFOL N/A 12/12/2020   Procedure: ESOPHAGOGASTRODUODENOSCOPY (EGD) WITH PROPOFOL;  Surgeon: Eloise Harman, DO;  [facility];  Service: Endoscopy;  Laterality: N/A;   TONSILLECTOMY      Family History  Problem Relation Age of Onset   Breast cancer Sister        Deceased in her 93s    Social History   Socioeconomic History   Marital status: Divorced    Spouse name: Not on file   Number of children: Not on file   Years of education: Not on file   Highest education level:  Not on file  Occupational History   Not on file  Tobacco Use   Smoking status: Former    Packs/day: 1.50    Years: 7.00    Pack years: 10.50    Types: Cigarettes   Smokeless tobacco: Never   Tobacco comments:    quit 50 yrs ago  Vaping Use   Vaping Use: Never used  Substance and Sexual Activity   Alcohol use: Not Currently   Drug use: Never   Sexual activity: Not on file  Other Topics Concern   Not on file  Social History Narrative   Not on file   Social Determinants of Health   Financial Resource Strain: Medium Risk   Difficulty of Paying Living Expenses: Somewhat hard  Food Insecurity: No Food Insecurity   Worried About Charity fundraiser in the Last Year: Never true   Ran Out of Food in the Last Year: Never true  Transportation Needs: Unmet Transportation Needs   Lack of Transportation (Medical): Yes   Lack of Transportation (Non-Medical): Yes  Physical Activity: Inactive   Days of Exercise per Week: 0 days   Minutes of Exercise per Session: 0 min  Stress: Stress Concern Present   Feeling of Stress : To some extent  Social Connections: Socially Isolated   Frequency of Communication with Friends and Family: Once a week   Frequency  of Social Gatherings with Friends and Family: Once a week   Attends Religious Services: 1 to 4 times per year   Active Member of Clubs or Organizations: No   Attends Archivist Meetings: Never   Marital Status: Divorced  Human resources officer Violence: Not At Risk   Fear of Current or Ex-Partner: No   Emotionally Abused: No   Physically Abused: No   Sexually Abused: No     Physical Exam   Vitals:   09/09/21 0530 09/09/21 0612  BP: 131/80 106/81  Pulse: (!) 123 (!) 117  Resp: 17 18  Temp:    SpO2: 97% 97%    CONSTITUTIONAL: Well-appearing, NAD NEURO:  Alert and oriented x 3, no focal deficits EYES:  eyes equal and reactive ENT/NECK:  no LAD, no JVD CARDIO: Regular rate, well-perfused, normal S1 and S2 PULM:  CTAB no  wheezing or rhonchi GI/GU:  normal bowel sounds, non-distended, moderate diffuse tenderness MSK/SPINE:  No gross deformities, no edema SKIN:  no rash, atraumatic PSYCH:  Appropriate speech and behavior  *Additional and/or pertinent findings included in MDM below  Diagnostic and Interventional Summary    EKG Interpretation  Date/Time:    Ventricular Rate:    PR Interval:    QRS Duration:   QT Interval:    QTC Calculation:   R Axis:     Text Interpretation:         Labs Reviewed  COMPREHENSIVE METABOLIC PANEL - Abnormal; Notable for the following components:      Result Value   Potassium 3.3 (*)    Glucose, Bld 286 (*)    All other components within normal limits  CBC - Abnormal; Notable for the following components:   WBC 14.8 (*)    Platelets 407 (*)    All other components within normal limits  LIPASE, BLOOD  URINALYSIS, ROUTINE W REFLEX MICROSCOPIC    CT ABDOMEN PELVIS W CONTRAST  Final Result    CT HEAD WO CONTRAST (5MM)  Final Result      Medications  sodium chloride 0.9 % bolus 1,000 mL (has no administration in time range)  sodium chloride 0.9 % bolus 1,000 mL (0 mLs Intravenous Stopped 09/09/21 0200)  ondansetron (ZOFRAN) injection 4 mg (4 mg Intravenous Given 09/09/21 0044)  iohexol (OMNIPAQUE) 300 MG/ML solution 100 mL (100 mLs Intravenous Contrast Given 09/09/21 0014)  HYDROmorphone (DILAUDID) injection 1 mg (1 mg Intramuscular Given 09/09/21 0515)     Procedures  /  Critical Care Fecal disimpaction  Date/Time: 09/09/2021 6:51 AM Performed by: Maudie Flakes, MD Authorized by: Maudie Flakes, MD  Consent: Verbal consent obtained. Consent given by: patient Patient understanding: patient states understanding of the procedure being performed Patient identity confirmed: verbally with patient Time out: Immediately prior to procedure a "time out" was called to verify the correct patient, procedure, equipment, support staff and site/side marked as  required. Patient tolerance: patient tolerated the procedure well with no immediate complications Comments: Large amount of firm stool removed from rectal vault.    ED Course and Medical Decision Making  I have reviewed the triage vital signs, the nursing notes, and pertinent available records from the EMR.  Listed above are laboratory and imaging tests that I personally ordered, reviewed, and interpreted and then considered in my medical decision making (see below for details).  Considering diverticulitis, appendicitis, perforated viscus, small bowel obstruction, also considering CNS etiology of patient's persistent nausea vomiting.  Awaiting CT imaging.  Patient is reportedly hospice,  unable to reach family to discuss goals of care/management.     CT revealing fecal impaction, disimpacted as described above.  Patient is feeling much better, resting comfortably.  Still with heart rate of 120-1 30.  She had multiple episodes of vomiting, suspect she remains dehydrated.  Will provide additional liter of fluid and reassess.  Hoping for discharge.  Signed out to oncoming provider at shift change.  Barth Kirks. Sedonia Small, [place] [email or web address]  Final Clinical Impressions(s) / ED Diagnoses     ICD-10-CM   1. Fecal impaction ([place])  K56.41     2. Dehydration  E86.0       ED Discharge Orders     None        Discharge Instructions Discussed with and Provided to Patient:   Discharge Instructions   None       Maudie Flakes, MD 09/09/21 [phone]

## 2021-09-09 NOTE — ED Provider Notes (Signed)
Pt signed out by Dr. Sedonia Small pending urine.  Urine does have some nitrites, but otherwise is ok.  She is currently getting abx for a UTI.  HR has improved.    She feels much better after fecal disimpaction.  She is stable for d/c.  Return if worse.    Isla Pence, MD 09/09/21 1023

## 2021-09-10 ENCOUNTER — Encounter (HOSPITAL_COMMUNITY): Payer: Self-pay

## 2021-09-10 ENCOUNTER — Other Ambulatory Visit: Payer: Self-pay

## 2021-09-10 ENCOUNTER — Emergency Department (HOSPITAL_COMMUNITY): Payer: Medicare Other

## 2021-09-10 ENCOUNTER — Emergency Department (HOSPITAL_COMMUNITY)
Admission: EM | Admit: 2021-09-10 | Discharge: 2021-09-10 | Disposition: A | Discharge status: Home / Self Care | Payer: Medicare Other | Attending: Emergency Medicine | Admitting: Emergency Medicine

## 2021-09-10 DIAGNOSIS — R279 Unspecified lack of coordination: Secondary | ICD-10-CM | POA: Diagnosis not present

## 2021-09-10 DIAGNOSIS — M25562 Pain in left knee: Secondary | ICD-10-CM | POA: Insufficient documentation

## 2021-09-10 DIAGNOSIS — W06XXXA Fall from bed, initial encounter: Secondary | ICD-10-CM | POA: Diagnosis not present

## 2021-09-10 DIAGNOSIS — E119 Type 2 diabetes mellitus without complications: Secondary | ICD-10-CM | POA: Insufficient documentation

## 2021-09-10 DIAGNOSIS — Z8544 Personal history of malignant neoplasm of other female genital organs: Secondary | ICD-10-CM | POA: Diagnosis not present

## 2021-09-10 DIAGNOSIS — S80919A Unspecified superficial injury of unspecified knee, initial encounter: Secondary | ICD-10-CM | POA: Diagnosis not present

## 2021-09-10 DIAGNOSIS — M1611 Unilateral primary osteoarthritis, right hip: Secondary | ICD-10-CM | POA: Diagnosis not present

## 2021-09-10 DIAGNOSIS — R102 Pelvic and perineal pain: Secondary | ICD-10-CM | POA: Diagnosis not present

## 2021-09-10 DIAGNOSIS — M25561 Pain in right knee: Secondary | ICD-10-CM | POA: Insufficient documentation

## 2021-09-10 DIAGNOSIS — Z79899 Other long term (current) drug therapy: Secondary | ICD-10-CM | POA: Diagnosis not present

## 2021-09-10 DIAGNOSIS — Z7901 Long term (current) use of anticoagulants: Secondary | ICD-10-CM | POA: Diagnosis not present

## 2021-09-10 DIAGNOSIS — Z87891 Personal history of nicotine dependence: Secondary | ICD-10-CM | POA: Insufficient documentation

## 2021-09-10 DIAGNOSIS — I959 Hypotension, unspecified: Secondary | ICD-10-CM | POA: Diagnosis not present

## 2021-09-10 DIAGNOSIS — W19XXXA Unspecified fall, initial encounter: Secondary | ICD-10-CM | POA: Diagnosis not present

## 2021-09-10 DIAGNOSIS — Z853 Personal history of malignant neoplasm of breast: Secondary | ICD-10-CM | POA: Insufficient documentation

## 2021-09-10 DIAGNOSIS — I10 Essential (primary) hypertension: Secondary | ICD-10-CM | POA: Insufficient documentation

## 2021-09-10 DIAGNOSIS — M1612 Unilateral primary osteoarthritis, left hip: Secondary | ICD-10-CM | POA: Diagnosis not present

## 2021-09-10 DIAGNOSIS — Z7401 Bed confinement status: Secondary | ICD-10-CM | POA: Diagnosis not present

## 2021-09-10 DIAGNOSIS — M25552 Pain in left hip: Secondary | ICD-10-CM | POA: Diagnosis not present

## 2021-09-10 MED ORDER — IBUPROFEN 800 MG PO TABS
800.0000 mg | ORAL_TABLET | Freq: Once | ORAL | Status: AC
  Administered 2021-09-10: 800 mg via ORAL
  Filled 2021-09-10: qty 1

## 2021-09-10 NOTE — ED Provider Notes (Signed)
[facility] EMERGENCY DEPARTMENT Provider Note   CSN: [id number] Arrival date & time: 09/10/21  0231     History Chief Complaint  Patient presents with  . Knee Pain    Brittney Carroll is a 75 y.o. female.   Knee Pain Location:  Knee Time since incident:  2 hours Injury: yes   Mechanism of injury: fall   Fall:    Fall occurred:  From a bed Knee location:  L knee and R knee Pain details:    Quality:  Aching   Radiates to:  Does not radiate   Severity:  Mild   Progression:  Unchanged Chronicity:  New Dislocation: no   Relieved by:  Nothing     Past Medical History:  Diagnosis Date  . Arthritis   . Breast mass, left   . Cancer ([place])    vaginal 2008  . Diabetes mellitus without complication ([place])   . Hyperlipidemia   . Hypertension     Patient Active Problem List   Diagnosis Date Noted  . Breast cancer of upper-outer quadrant of left female breast ([place]) 02/24/2021  . Pulmonary embolism ([place]) 01/23/2021  . Breast mass in female   . Symptomatic anemia   . Goals of care, counseling/discussion   . Palliative care by specialist   . GI bleed 12/09/2020    Past Surgical History:  Procedure Laterality Date  . ABDOMINAL HYSTERECTOMY    . APPENDECTOMY    . BIOPSY  12/12/2020   Procedure: BIOPSY;  Surgeon: Eloise Harman, DO;  Location: [facility] ENDO SUITE;  Service: Endoscopy;;  . COLONOSCOPY WITH PROPOFOL N/A 12/12/2020   Procedure: COLONOSCOPY WITH PROPOFOL;  Surgeon: Eloise Harman, DO;  Location: [facility] ENDO SUITE;  Service: Endoscopy;  Laterality: N/A;  . ESOPHAGOGASTRODUODENOSCOPY (EGD) WITH PROPOFOL N/A 12/12/2020   Procedure: ESOPHAGOGASTRODUODENOSCOPY (EGD) WITH PROPOFOL;  Surgeon: Eloise Harman, DO;  Location: [facility] ENDO SUITE;  Service: Endoscopy;  Laterality: N/A;  . TONSILLECTOMY       OB History   No obstetric history on file.     Family History  Problem Relation Age of Onset  . Breast cancer Sister        Deceased in her 77s    Social  History   Tobacco Use  . Smoking status: Former    Packs/day: 1.50    Years: 7.00    Pack years: 10.50    Types: Cigarettes  . Smokeless tobacco: Never  . Tobacco comments:    quit 50 yrs ago  Vaping Use  . Vaping Use: Never used  Substance Use Topics  . Alcohol use: Not Currently  . Drug use: Never    Home Medications Prior to Admission medications   Medication Sig Start Date End Date Taking? Authorizing Provider  acetaminophen (TYLENOL) 325 MG tablet Take 650 mg by mouth every 4 (four) hours as needed (pain/discomfort or fever greater than 100 (Max 3g/24hrs.)).    [provider]  amLODipine (NORVASC) 5 MG tablet Take 1 tablet (5 mg total) by mouth daily. 12/15/20   Orson Eva, MD  anastrozole (ARIMIDEX) 1 MG tablet Take 1 tablet (1 mg total) by mouth daily. 05/25/21   Derek Jack, MD  cholecalciferol (VITAMIN D3) 25 MCG (1000 UNIT) tablet Take 1 tablet (1,000 Units total) by mouth daily. 05/25/21   Derek Jack, MD  docusate sodium (COLACE) 100 MG capsule Take 100 mg by mouth 2 (two) times daily. (0900 & 2100)    [provider]  Arne Cleveland  5 MG TABS tablet Take 5 mg by mouth 2 (two) times daily. (0900 & 2100) 07/18/21   [provider]  ferrous sulfate 325 (65 FE) MG tablet Take 1 tablet (325 mg total) by mouth daily with breakfast. 12/15/20   Tat, Shanon Brow, MD  HYDROmorphone (DILAUDID) 2 MG tablet Take 1 tablet (2 mg total) by mouth every 4 (four) hours as needed for severe pain. 01/23/21   Derek Jack, MD  LORazepam (ATIVAN) 0.5 MG tablet Take 0.5 mg by mouth every 3 (three) hours as needed (agitation/anxiety). 03/29/21   [provider]  methocarbamol (ROBAXIN) 500 MG tablet Take 500 mg by mouth in the morning, at noon, in the evening, and at bedtime. (0900, 1300, 1700 & 2100) 02/23/21   [provider]  Nutritional Supplements (NUTRITIONAL SUPPLEMENT PO) Take 1 Dose by mouth with breakfast, with lunch, and with evening  meal. Health Shake (0800, 1200 & 1700)    [provider]  olmesartan (BENICAR) 40 MG tablet Take 40 mg by mouth daily. 07/12/20   [provider]  omeprazole (PRILOSEC) 20 MG capsule Take 20 mg by mouth 2 (two) times daily. (0600 & 2000) 02/24/21   [provider]  ondansetron (ZOFRAN) 8 MG tablet Take 8 mg by mouth every 4 (four) hours as needed for vomiting or nausea. 03/14/21   [provider]  oxyCODONE (ROXICODONE) 15 MG immediate release tablet Take 15 mg by mouth every 6 (six) hours as needed for pain. 04/22/21   [provider]  pantoprazole (PROTONIX) 40 MG tablet Take 1 tablet (40 mg total) by mouth 2 (two) times daily before a meal. 12/14/20   Tat, Shanon Brow, MD  polyethylene glycol (MIRALAX / GLYCOLAX) 17 g packet Take 17 g by mouth daily. 12/15/20   Orson Eva, MD  prochlorperazine (COMPAZINE) 10 MG tablet Take 10 mg by mouth every 6 (six) hours as needed for nausea or vomiting. 03/31/21   [provider]  promethazine (PHENERGAN) 25 MG/ML injection Inject 25 mg into the muscle every 4 (four) hours as needed for nausea or vomiting.    [provider]  senna (SENOKOT) 8.6 MG TABS tablet Take 2 tablets by mouth in the morning and at bedtime.    [provider]  tiZANidine (ZANAFLEX) 4 MG tablet Take 4 mg by mouth in the morning, at noon, and at bedtime. (0900, 1400 & 2100)    [provider]  venlafaxine XR (EFFEXOR-XR) 75 MG 24 hr capsule Take 75 mg by mouth daily. (0900) 02/24/21   [provider]    Allergies    Atenolol  Review of Systems   Review of Systems  All other systems reviewed and are negative.  Physical Exam Updated Vital Signs BP 137/74 (BP Location: Left Arm)   Pulse 92   Temp 98.3 F (36.8 C) (Oral)   Resp 16   Ht 5\' 6"  (1.676 m)   Wt 62.6 kg   SpO2 99%   BMI 22.28 kg/m   Physical Exam Vitals and nursing note reviewed.  HENT:     Head: Normocephalic and atraumatic.      Mouth/Throat:     Mouth: Mucous membranes are moist.     Pharynx: Oropharynx is clear.  Eyes:     Pupils: Pupils are equal, round, and reactive to light.  Pulmonary:     Effort: Pulmonary effort is normal.  Abdominal:     General: Abdomen is flat.  Musculoskeletal:  General: No swelling or tenderness. Normal range of motion.  Skin:    General: Skin is warm and dry.  Neurological:     General: No focal deficit present.     Cranial Nerves: No cranial nerve deficit.     Sensory: No sensory deficit.    ED Results / Procedures / Treatments   Labs (all labs ordered are listed, but only abnormal results are displayed) Labs Reviewed - No data to display  EKG None  Radiology DG Pelvis 1-2 Views  Result Date: 09/10/2021 CLINICAL DATA:  Fall, bilateral hip pain EXAM: PELVIS - 1-2 VIEW COMPARISON:  01/17/21 FINDINGS: Normal alignment. No acute fracture or dislocation. Severe bilateral hip degenerative arthritis with near complete loss of the joint space and remodeling of both femoral heads and acetabula bilaterally. Diffuse osteopenia noted. Moderate stool noted within the rectal vault. Soft tissues are otherwise unremarkable. IMPRESSION: No acute fracture or dislocation. Severe bilateral hip degenerative arthritis. Electronically Signed   By: Fidela Salisbury M.D.   On: 09/10/2021 03:24   CT HEAD WO CONTRAST (5MM)  Result Date: 09/09/2021 CLINICAL DATA:  ICP elevation suspected Vomiting.  Altered mental status. EXAM: CT HEAD WITHOUT CONTRAST TECHNIQUE: Contiguous axial images were obtained from the base of the skull through the vertex without intravenous contrast. COMPARISON:  None. FINDINGS: Brain: Brain volume is normal for age. No intracranial hemorrhage, mass effect, or midline shift. No hydrocephalus. The basilar cisterns are patent. No evidence of territorial infarct or acute ischemia. No extra-axial or intracranial fluid collection. Vascular: Atherosclerosis of skullbase vasculature  without hyperdense vessel or abnormal calcification. Skull: No fracture or focal lesion. Sinuses/Orbits: Paranasal sinuses and mastoid air cells are clear. The visualized orbits are unremarkable. Other: None. IMPRESSION: Unremarkable noncontrast head CT for age. Electronically Signed   By: Keith Rake M.D.   On: 09/09/2021 01:05   CT ABDOMEN PELVIS W CONTRAST  Result Date: 09/09/2021 CLINICAL DATA:  Abdominal pain, acute, nonlocalized. Vomiting. History of breast cancer. EXAM: CT ABDOMEN AND PELVIS WITH CONTRAST TECHNIQUE: Multidetector CT imaging of the abdomen and pelvis was performed using the standard protocol following bolus administration of intravenous contrast. CONTRAST:  114mL OMNIPAQUE IOHEXOL 300 MG/ML  SOLN COMPARISON:  12/09/2020 FINDINGS: Lower chest: The visualized lung bases are clear. The visualized heart and pericardium are unremarkable. Hepatobiliary: No focal liver abnormality is seen. No gallstones, gallbladder wall thickening, or biliary dilatation. Pancreas: Unremarkable Spleen: Unremarkable Adrenals/Urinary Tract: The adrenal glands are unremarkable. The kidneys are normal in size. There is marked cortical atrophy involving the a posterior aspect of the left kidney likely reflecting the sequela of remote infarction. The kidneys are otherwise unremarkable. The bladder is unremarkable. Stomach/Bowel: There is a large amount of stool seen within the rectum with mild presacral edema noted which, together, may reflect changes of fecal impaction and/or stercoral proctitis. Moderate ascending and sigmoid diverticulosis without superimposed acute inflammatory change. The stomach, small bowel, and large bowel are otherwise unremarkable. Appendix normal. No free intraperitoneal gas or fluid. Vascular/Lymphatic: Moderate aortoiliac atherosclerotic calcification. No aortic aneurysm. Circumaortic left renal vein. No pathologic adenopathy within the abdomen and pelvis. Retroperitoneal lymph node  dissection has been performed. Reproductive: Status post hysterectomy. No adnexal masses. Other: No abdominal wall hernia. Musculoskeletal: Advanced degenerative arthritis is noted within the hips bilaterally with associated small bilateral hip effusions. Degenerative changes are noted within the visualized thoracolumbar spine. No acute bone abnormality. IMPRESSION: Large volume stool within the rectum with associated mild presacral edema possibly reflecting changes of fecal  impaction and/or stercoral proctitis. Moderate sigmoid diverticulosis without superimposed acute inflammatory change. Marked asymmetric atrophy of the left kidney likely the sequela of remote infarction. Status post retroperitoneal lymph node dissection. Severe bilateral degenerative hip arthritis. Aortic Atherosclerosis (ICD10-I70.0). Electronically Signed   By: Fidela Salisbury M.D.   On: 09/09/2021 01:11   DG Femur Min 2 Views Left  Result Date: 09/10/2021 CLINICAL DATA:  Fall, left hip and knee pain EXAM: LEFT FEMUR 2 VIEWS COMPARISON:  Left hip radiograph 01/17/2021 FINDINGS: Normal alignment. No acute fracture or dislocation. Severe left hip degenerative arthritis with near complete loss of the joint space and remodeling of both the femoral head and acetabulum. There is diffuse osteopenia noted. Left knee joint spaces are not well profiled. Vascular calcifications are seen within the left thigh. IMPRESSION: No acute fracture or dislocation. Severe left hip degenerative arthritis. Electronically Signed   By: Fidela Salisbury M.D.   On: 09/10/2021 03:22   DG Femur Min 2 Views Right  Result Date: 09/10/2021 CLINICAL DATA:  Fall, bilateral hip and knee pain EXAM: RIGHT FEMUR 2 VIEWS COMPARISON:  Right hip radiograph 01/17/2021 FINDINGS: Normal alignment. No acute fracture or dislocation. Severe right hip degenerative arthritis, similar to that noted on prior examination, with near complete loss of the joint space and remodeling of both the  femoral head and the acetabulum. Moderate bicompartmental degenerative arthritis involving the medial and lateral compartments. Osseous structures are diffusely osteopenic. Vascular calcifications are noted within the right thigh. IMPRESSION: No acute fracture or dislocation. Severe right hip degenerative arthritis. Moderate bicompartmental right knee degenerative arthritis. Electronically Signed   By: Fidela Salisbury M.D.   On: 09/10/2021 03:18    Procedures Procedures   Medications Ordered in ED Medications  ibuprofen (ADVIL) tablet 800 mg (800 mg Oral Given 09/10/21 0308)    ED Course  I have reviewed the triage vital signs and the nursing notes.  Pertinent labs & imaging results that were available during my care of the patient were reviewed by me and considered in my medical decision making (see chart for details).    MDM Rules/Calculators/A&P                         No obvious deformity or injuries. No head trauma. Stable for discharge.    Final Clinical Impression(s) / ED Diagnoses Final diagnoses:  Fall, initial encounter    Rx / DC Orders ED Discharge Orders     None        Agam Tuohy, Corene Cornea, MD 09/10/21 [phone]

## 2021-09-10 NOTE — ED Triage Notes (Signed)
Pt arrived via [facility] c/o bilateral knee pain. Left reportedly worse than the right. Pt reports she does not ambulate, but fell trying to get out of bed while she reports she was dreaming about "getting up to attend a meeting." Pt does not have any obvious swelling or deformity noted during Triage. EDP present at bedside. Pt denies LOC.

## 2021-09-10 NOTE — ED Notes (Signed)
Patient transported to X-ray 

## 2021-09-11 DIAGNOSIS — F419 Anxiety disorder, unspecified: Secondary | ICD-10-CM | POA: Diagnosis not present

## 2021-09-11 DIAGNOSIS — K219 Gastro-esophageal reflux disease without esophagitis: Secondary | ICD-10-CM | POA: Diagnosis not present

## 2021-09-11 DIAGNOSIS — G893 Neoplasm related pain (acute) (chronic): Secondary | ICD-10-CM | POA: Diagnosis not present

## 2021-09-11 DIAGNOSIS — R4689 Other symptoms and signs involving appearance and behavior: Secondary | ICD-10-CM | POA: Diagnosis not present

## 2021-09-11 DIAGNOSIS — M25559 Pain in unspecified hip: Secondary | ICD-10-CM | POA: Diagnosis not present

## 2021-09-11 DIAGNOSIS — C50919 Malignant neoplasm of unspecified site of unspecified female breast: Secondary | ICD-10-CM | POA: Diagnosis not present

## 2021-09-11 DIAGNOSIS — C50512 Malignant neoplasm of lower-outer quadrant of left female breast: Secondary | ICD-10-CM | POA: Diagnosis not present

## 2021-09-13 ENCOUNTER — Other Ambulatory Visit: Payer: PPO

## 2021-09-14 ENCOUNTER — Inpatient Hospital Stay (HOSPITAL_COMMUNITY): Payer: PPO | Attending: Hematology

## 2021-09-14 ENCOUNTER — Ambulatory Visit (HOSPITAL_COMMUNITY): Payer: PPO | Admitting: Hematology

## 2021-09-15 DIAGNOSIS — K5900 Constipation, unspecified: Secondary | ICD-10-CM | POA: Diagnosis not present

## 2021-09-20 ENCOUNTER — Ambulatory Visit: Payer: PPO

## 2021-09-20 ENCOUNTER — Other Ambulatory Visit: Payer: Self-pay

## 2021-09-25 ENCOUNTER — Ambulatory Visit: Payer: PPO | Admitting: Cardiology

## 2021-09-25 DIAGNOSIS — R52 Pain, unspecified: Secondary | ICD-10-CM | POA: Diagnosis not present

## 2021-09-25 DIAGNOSIS — C50512 Malignant neoplasm of lower-outer quadrant of left female breast: Secondary | ICD-10-CM | POA: Diagnosis not present

## 2021-09-25 DIAGNOSIS — Z170 Estrogen receptor positive status [ER+]: Secondary | ICD-10-CM | POA: Diagnosis not present

## 2021-09-25 DIAGNOSIS — C50919 Malignant neoplasm of unspecified site of unspecified female breast: Secondary | ICD-10-CM | POA: Diagnosis not present

## 2021-09-25 DIAGNOSIS — I10 Essential (primary) hypertension: Secondary | ICD-10-CM | POA: Diagnosis not present

## 2021-09-25 DIAGNOSIS — G893 Neoplasm related pain (acute) (chronic): Secondary | ICD-10-CM | POA: Diagnosis not present

## 2021-09-25 DIAGNOSIS — Z79899 Other long term (current) drug therapy: Secondary | ICD-10-CM | POA: Diagnosis not present

## 2021-09-25 DIAGNOSIS — F419 Anxiety disorder, unspecified: Secondary | ICD-10-CM | POA: Diagnosis not present

## 2021-09-26 ENCOUNTER — Ambulatory Visit
Admission: RE | Admit: 2021-09-26 | Discharge: 2021-09-26 | Disposition: A | Discharge status: Home / Self Care | Payer: PPO | Source: Ambulatory Visit | Attending: Cardiology | Admitting: Cardiology

## 2021-09-26 ENCOUNTER — Other Ambulatory Visit: Payer: Self-pay

## 2021-09-26 DIAGNOSIS — Z01810 Encounter for preprocedural cardiovascular examination: Secondary | ICD-10-CM | POA: Insufficient documentation

## 2021-09-26 DIAGNOSIS — I10 Essential (primary) hypertension: Secondary | ICD-10-CM | POA: Insufficient documentation

## 2021-09-26 DIAGNOSIS — I339 Acute and subacute endocarditis, unspecified: Secondary | ICD-10-CM | POA: Insufficient documentation

## 2021-09-26 DIAGNOSIS — I358 Other nonrheumatic aortic valve disorders: Secondary | ICD-10-CM | POA: Diagnosis not present

## 2021-09-26 DIAGNOSIS — R0602 Shortness of breath: Secondary | ICD-10-CM

## 2021-09-26 DIAGNOSIS — E119 Type 2 diabetes mellitus without complications: Secondary | ICD-10-CM | POA: Insufficient documentation

## 2021-09-26 DIAGNOSIS — E785 Hyperlipidemia, unspecified: Secondary | ICD-10-CM | POA: Diagnosis not present

## 2021-09-26 LAB — ECHOCARDIOGRAM COMPLETE
AR max vel: 2.85 cm2
AV Area VTI: 3.02 cm2
AV Area mean vel: 2.79 cm2
AV Mean grad: 2 mmHg
AV Peak grad: 2.9 mmHg
Ao pk vel: 0.85 m/s
Area-P 1/2: 9.48 cm2
MV VTI: 2.68 cm2
S' Lateral: 2.21 cm

## 2021-09-26 NOTE — Progress Notes (Signed)
*  PRELIMINARY RESULTS* Echocardiogram 2D Echocardiogram has been performed.  Brittney Carroll 09/26/2021, 11:47 AM

## 2021-09-28 ENCOUNTER — Ambulatory Visit (INDEPENDENT_AMBULATORY_CARE_PROVIDER_SITE_OTHER): Payer: PPO | Admitting: Cardiology

## 2021-09-28 ENCOUNTER — Encounter: Payer: Self-pay | Admitting: Cardiology

## 2021-09-28 ENCOUNTER — Other Ambulatory Visit: Payer: Self-pay

## 2021-09-28 VITALS — BP 100/60 | HR 114 | Ht 68.0 in | Wt 139.0 lb

## 2021-09-28 DIAGNOSIS — Z01818 Encounter for other preprocedural examination: Secondary | ICD-10-CM

## 2021-09-28 DIAGNOSIS — I10 Essential (primary) hypertension: Secondary | ICD-10-CM | POA: Diagnosis not present

## 2021-09-28 NOTE — Progress Notes (Signed)
Cardiology Office Note:    Date:  09/28/2021   ID:  Brittney Carroll, DOB 31-May-1946, MRN [id number]  PCP:  Celene Squibb, MD   [facility] Providers Cardiologist:  None     Referring MD: Celene Squibb, MD   Chief Complaint  Patient presents with   Other    Follow up post ECHO. Patient c/o a little SOB. Meds reviewed verbally with patient.     History of Present Illness:    Brittney Carroll is a 75 y.o. female with a hx of hypertension, hyperlipidemia, diabetes, PE on Eliquis,  left breast cancer who presents for follow-up.  Previously seen for preop evaluation.  Diagnosed with breast cancer about 8 months ago, surgery is being planned/considered.  Patient is wheelchair-bound, needs help with ambulation and movements.  Has chronic on and he takes preventing noninvasive stress testing.  Echocardiogram was performed to evaluate ejection fraction.  She takes Eliquis due to PE.  Unable to lay flat due to hip and back pains.  She presents today for echocardiogram results, denies any new concerns.  Past Medical History:  Diagnosis Date   Arthritis    Breast mass, left    Cancer [facility])    vaginal 2008   Diabetes mellitus without complication ([place])    Hyperlipidemia    Hypertension     Past Surgical History:  Procedure Laterality Date   ABDOMINAL HYSTERECTOMY     APPENDECTOMY     BIOPSY  12/12/2020   Procedure: BIOPSY;  Surgeon: Eloise Harman, DO;  Location: [facility] ENDO [facility];  Service: Endoscopy;;   COLONOSCOPY WITH PROPOFOL N/A 12/12/2020   Procedure: COLONOSCOPY WITH PROPOFOL;  Surgeon: Eloise Harman, DO;  Location: [facility];  Service: Endoscopy;  Laterality: N/A;   ESOPHAGOGASTRODUODENOSCOPY (EGD) WITH PROPOFOL N/A 12/12/2020   Procedure: ESOPHAGOGASTRODUODENOSCOPY (EGD) WITH PROPOFOL;  Surgeon: Eloise Harman, DO;  Location[facility];  Service: Endoscopy;  Laterality: N/A;   TONSILLECTOMY      Current Medications: Current Meds  Medication Sig    acetaminophen (TYLENOL) 325 MG tablet Take 650 mg by mouth every 4 (four) hours as needed (pain/discomfort or fever greater than 100 (Max 3g/24hrs.)).   amLODipine (NORVASC) 5 MG tablet Take 1 tablet (5 mg total) by mouth daily.   anastrozole (ARIMIDEX) 1 MG tablet Take 1 tablet (1 mg total) by mouth daily.   cholecalciferol (VITAMIN D3) 25 MCG (1000 UNIT) tablet Take 1 tablet (1,000 Units total) by mouth daily.   docusate sodium (COLACE) 100 MG capsule Take 100 mg by mouth 2 (two) times daily. (0900 & 2100)   ELIQUIS 5 MG TABS tablet Take 5 mg by mouth 2 (two) times daily. (0900 & 2100)   ferrous sulfate 325 (65 FE) MG tablet Take 1 tablet (325 mg total) by mouth daily with breakfast.   HYDROmorphone (DILAUDID) 2 MG tablet Take 1 tablet (2 mg total) by mouth every 4 (four) hours as needed for severe pain.   LORazepam (ATIVAN) 0.5 MG tablet Take 0.5 mg by mouth every 3 (three) hours as needed (agitation/anxiety).   methocarbamol (ROBAXIN) 500 MG tablet Take 500 mg by mouth in the morning, at noon, in the evening, and at bedtime. (0900, 1300, 1700 & 2100)   Nutritional Supplements (NUTRITIONAL SUPPLEMENT PO) Take 1 Dose by mouth with breakfast, with lunch, and with evening meal. Health Shake (0800, 1200 & 1700)   olmesartan (BENICAR) 40 MG tablet Take 40 mg by mouth daily.   omeprazole (  PRILOSEC) 20 MG capsule Take 20 mg by mouth 2 (two) times daily. (0600 & 2000)   ondansetron (ZOFRAN) 8 MG tablet Take 8 mg by mouth every 4 (four) hours as needed for vomiting or nausea.   oxyCODONE (ROXICODONE) 15 MG immediate release tablet Take 15 mg by mouth every 6 (six) hours as needed for pain.   pantoprazole (PROTONIX) 40 MG tablet Take 1 tablet (40 mg total) by mouth 2 (two) times daily before a meal.   polyethylene glycol (MIRALAX / GLYCOLAX) 17 g packet Take 17 g by mouth daily.   prochlorperazine (COMPAZINE) 10 MG tablet Take 10 mg by mouth every 6 (six) hours as needed for nausea or vomiting.    promethazine (PHENERGAN) 25 MG/ML injection Inject 25 mg into the muscle every 4 (four) hours as needed for nausea or vomiting.   senna (SENOKOT) 8.6 MG TABS tablet Take 2 tablets by mouth in the morning and at bedtime.   tiZANidine (ZANAFLEX) 4 MG tablet Take 4 mg by mouth in the morning, at noon, and at bedtime. (0900, 1400 & 2100)   venlafaxine XR (EFFEXOR-XR) 75 MG 24 hr capsule Take 75 mg by mouth daily. (0900)     Allergies:   Atenolol   Social History   Socioeconomic History   Marital status: Divorced    Spouse name: Not on file   Number of children: Not on file   Years of education: Not on file   Highest education level: Not on file  Occupational History   Not on file  Tobacco Use   Smoking status: Former    Packs/day: 1.50    Years: 7.00    Pack years: 10.50    Types: Cigarettes   Smokeless tobacco: Never   Tobacco comments:    quit 50 yrs ago  Vaping Use   Vaping Use: Never used  Substance and Sexual Activity   Alcohol use: Not Currently   Drug use: Never   Sexual activity: Not Currently  Other Topics Concern   Not on file  Social History Narrative   Not on file   Social Determinants of Health   Financial Resource Strain: Medium Risk   Difficulty of Paying Living Expenses: Somewhat hard  Food Insecurity: No Food Insecurity   Worried About Charity fundraiser in the Last Year: Never true   Ran Out of Food in the Last Year: Never true  Transportation Needs: Unmet Transportation Needs   Lack of Transportation (Medical): Yes   Lack of Transportation (Non-Medical): Yes  Physical Activity: Inactive   Days of Exercise per Week: 0 days   Minutes of Exercise per Session: 0 min  Stress: Stress Concern Present   Feeling of Stress : To some extent  Social Connections: Socially Isolated   Frequency of Communication with Friends and Family: Once a week   Frequency of Social Gatherings with Friends and Family: Once a week   Attends Religious Services: 1 to 4 times  per year   Active Member of [employer] or Organizations: No   Attends Archivist Meetings: Never   Marital Status: Divorced     Family History: The patient's family history includes Breast cancer in her sister.  ROS:   Please see the history of present illness.     All other systems reviewed and are negative.  EKGs/Labs/Other Studies Reviewed:    The following studies were reviewed today:   EKG:  EKG is  ordered today.  The ekg ordered today demonstrates sinus  tachycardia, possible old inferior and septal infarct.  Recent Labs: 09/08/2021: ALT 13; BUN 19; Creatinine, Ser 0.76; Hemoglobin 12.7; Platelets 407; Potassium 3.3; Sodium 137  Recent Lipid Panel No results found for: CHOL, TRIG, HDL, CHOLHDL, VLDL, LDLCALC, LDLDIRECT   Risk Assessment/Calculations:          Physical Exam:    VS:  BP 100/60 (BP Location: Left Arm, Patient Position: Sitting, Cuff Size: Normal)   Pulse (!) 114   Ht 5\' 8"  (1.727 m)   Wt 139 lb (63 kg)   SpO2 95%   BMI 21.13 kg/m     Wt Readings from Last 3 Encounters:  09/28/21 139 lb (63 kg)  09/10/21 138 lb 0.1 oz (62.6 kg)  09/08/21 138 lb (62.6 kg)     GEN: Appears weak, sitting in wheelchair HEENT: Hard of hearing NECK: No JVD; No carotid bruits LYMPHATICS: No lymphadenopathy CARDIAC: RRR, no murmurs, rubs, gallops RESPIRATORY:  Clear to auscultation without rales, wheezing or rhonchi  ABDOMEN: Soft, non-tender, non-distended MUSCULOSKELETAL:  No edema; No deformity  SKIN: Warm and dry NEUROLOGIC:  Alert and oriented x 3 PSYCHIATRIC:  Normal affect   ASSESSMENT:    1. Pre-op evaluation   2. Primary hypertension     PLAN:    In order of problems listed above:  Preop evaluation prior to possible left mastectomy.  Patient is extremely deconditioned, wheelchair-bound, not able to ambulate or get out of bed without help.  Not able to lay flat on her back due to chronic hip and back pains, as such any form of cardiac stress  testing is not possible.  Echocardiogram showed normal ejection fraction with EF 75%.  No gross structural abnormalities noted.  Overall patient is moderate  risk for surgical procedure.  No additional cardiac testing will mitigate patient's risk.  Patient can proceed with surgery from a cardiac perspective. Hypertension, controlled continue Benicar.  Follow-up as needed     Medication Adjustments/Labs and Tests Ordered: Current medicines are reviewed at length with the patient today.  Concerns regarding medicines are outlined above.  Orders Placed This Encounter  Procedures   EKG 12-Lead     No orders of the defined types were placed in this encounter.   Patient Instructions  Medication Instructions:   Your physician recommends that you continue on your current medications as directed. Please refer to the Current Medication list given to you today.  *If you need a refill on your cardiac medications before your next appointment, please call your pharmacy*   Lab Work:  None Ordered  If you have labs (blood work) drawn today and your tests are completely normal, you will receive your results only by: [place] (if you have MyChart) OR A paper copy in the mail If you have any lab test that is abnormal or we need to change your treatment, we will call you to review the results.   Testing/Procedures:  None Ordered   Follow-Up: At [facility], you and your health needs are our priority.  As part of our continuing mission to provide you with exceptional heart care, we have created designated Provider Care Teams.  These Care Teams include your primary Cardiologist (physician) and Advanced Practice Providers (APPs -  Physician Assistants and Nurse Practitioners) who all work together to provide you with the care you need, when you need it.  We recommend signing up for the patient portal called "MyChart".  Sign up information is provided on this After Visit Summary.  MyChart is used to connect with patients for Virtual Visits (Telemedicine).  Patients are able to view lab/test results, encounter notes, upcoming appointments, etc.  Non-urgent messages can be sent to your provider as well.   To learn more about what you can do with MyChart, go to [email or web address].    Your next appointment:  As needed   Signed, Kate Sable, MD  09/28/2021 2:11 PM    [place]

## 2021-09-28 NOTE — Patient Instructions (Signed)
Medication Instructions:   Your physician recommends that you continue on your current medications as directed. Please refer to the Current Medication list given to you today.  *If you need a refill on your cardiac medications before your next appointment, please call your pharmacy*   Lab Work:  None Ordered  If you have labs (blood work) drawn today and your tests are completely normal, you will receive your results only by: [place] (if you have MyChart) OR A paper copy in the mail If you have any lab test that is abnormal or we need to change your treatment, we will call you to review the results.   Testing/Procedures:  None Ordered   Follow-Up: At [facility], you and your health needs are our priority.  As part of our continuing mission to provide you with exceptional heart care, we have created designated Provider Care Teams.  These Care Teams include your primary Cardiologist (physician) and Advanced Practice Providers (APPs -  Physician Assistants and Nurse Practitioners) who all work together to provide you with the care you need, when you need it.  We recommend signing up for the patient portal called "MyChart".  Sign up information is provided on this After Visit Summary.  MyChart is used to connect with patients for Virtual Visits (Telemedicine).  Patients are able to view lab/test results, encounter notes, upcoming appointments, etc.  Non-urgent messages can be sent to your provider as well.   To learn more about what you can do with MyChart, go to [email or web address].    Your next appointment:  As needed

## 2021-10-23 DIAGNOSIS — Z79899 Other long term (current) drug therapy: Secondary | ICD-10-CM | POA: Diagnosis not present

## 2021-10-23 DIAGNOSIS — K5731 Diverticulosis of large intestine without perforation or abscess with bleeding: Secondary | ICD-10-CM | POA: Diagnosis not present

## 2021-10-23 DIAGNOSIS — I10 Essential (primary) hypertension: Secondary | ICD-10-CM | POA: Diagnosis not present

## 2021-10-23 DIAGNOSIS — R52 Pain, unspecified: Secondary | ICD-10-CM | POA: Diagnosis not present

## 2021-10-23 DIAGNOSIS — Z170 Estrogen receptor positive status [ER+]: Secondary | ICD-10-CM | POA: Diagnosis not present

## 2021-10-23 DIAGNOSIS — D649 Anemia, unspecified: Secondary | ICD-10-CM | POA: Diagnosis not present

## 2021-10-23 DIAGNOSIS — K922 Gastrointestinal hemorrhage, unspecified: Secondary | ICD-10-CM | POA: Diagnosis not present

## 2021-10-23 DIAGNOSIS — C50512 Malignant neoplasm of lower-outer quadrant of left female breast: Secondary | ICD-10-CM | POA: Diagnosis not present

## 2021-10-26 ENCOUNTER — Encounter (HOSPITAL_COMMUNITY): Payer: Self-pay | Admitting: General Surgery

## 2021-10-26 ENCOUNTER — Other Ambulatory Visit: Payer: Self-pay

## 2021-10-26 DIAGNOSIS — I10 Essential (primary) hypertension: Secondary | ICD-10-CM | POA: Diagnosis not present

## 2021-10-26 DIAGNOSIS — K922 Gastrointestinal hemorrhage, unspecified: Secondary | ICD-10-CM | POA: Diagnosis not present

## 2021-10-26 DIAGNOSIS — Z170 Estrogen receptor positive status [ER+]: Secondary | ICD-10-CM | POA: Diagnosis not present

## 2021-10-26 DIAGNOSIS — F419 Anxiety disorder, unspecified: Secondary | ICD-10-CM | POA: Diagnosis not present

## 2021-10-26 DIAGNOSIS — Z79899 Other long term (current) drug therapy: Secondary | ICD-10-CM | POA: Diagnosis not present

## 2021-10-26 DIAGNOSIS — K5731 Diverticulosis of large intestine without perforation or abscess with bleeding: Secondary | ICD-10-CM | POA: Diagnosis not present

## 2021-10-26 NOTE — Progress Notes (Signed)
PCP - Dr. Nevada Crane Cardiologist - nursing director is unsure - cannot remember name immediately EKG - 09/28/21 Chest x-ray -  ECHO - 09/26/21 Cardiac Cath -  CPAP -   Fasting Blood Sugar:  pt CBG is not checked at rehab facility d/t being on hospice care prior - hospice "revoked" for pt to have this surgery. Pt is not on any diabetic medications.   Blood Thinner Instructions: Eliquis last dose per Dedra Skeens (nursing director) 10/24/21 Aspirin Instructions:   ERAS Protcol - 0938 clears   COVID TEST- DOS  Anesthesia review: n/a  -------------  SDW INSTRUCTIONS:  Your procedure is scheduled on Friday 11/11. Please report to [facility] Main Entrance "A" at 11:15 A.M., and check in at the Admitting office. Call this number if you have problems the morning of surgery: [phone]   Remember: Do not eat after midnight the night before your surgery  You may drink clear liquids until 1100 AM the morning of your surgery.   Clear liquids allowed are: Water, Non-Citrus Juices (without pulp), Carbonated Beverages, Clear Tea, Black Coffee Only, and Gatorade   Medications to take morning of surgery with a sip of water include: acetaminophen (TYLENOL) - if needed amLODipine (NORVASC)  anastrozole (ARIMIDEX) HYDROmorphone (DILAUDID) - if needed omeprazole (PRILOSEC)  oxyCODONE HCl - if needed pantoprazole (PROTONIX) prochlorperazine (COMPAZINE) - if needed promethazine (PHENERGAN) - if needed venlafaxine XR (EFFEXOR-XR)    As of today, STOP taking any Aspirin (unless otherwise instructed by your surgeon), Aleve, Naproxen, Ibuprofen, Motrin, Advil, Goody's, BC's, all herbal medications, fish oil, and all vitamins.    The Morning of Surgery Do not wear jewelry, make-up or nail polish. Do not wear lotions, powders, or perfumes, or deodorant  Do not bring valuables to the hospital. [facility] is not responsible for any belongings or valuables.  If you are a smoker, DO NOT Smoke 24 hours  prior to surgery  If you wear a CPAP at night please bring your mask the morning of surgery   Remember that you must have someone to transport you home after your surgery, and remain with you for 24 hours if you are discharged the same day.  Please bring cases for contacts, glasses, hearing aids, dentures or bridgework because it cannot be worn into surgery.   Patients discharged the day of surgery will not be allowed to drive home.   Please shower the NIGHT BEFORE/MORNING OF SURGERY (use antibacterial soap like DIAL soap if possible). Wear comfortable clothes the morning of surgery. Oral Hygiene is also important to reduce your risk of infection.  Remember - BRUSH YOUR TEETH THE MORNING OF SURGERY WITH YOUR REGULAR TOOTHPASTE  Patient denies shortness of breath, fever, cough and chest pain.

## 2021-10-26 NOTE — Anesthesia Preprocedure Evaluation (Addendum)
Anesthesia Evaluation  Patient identified by MRN, date of birth, ID band Patient awake    Reviewed: Allergy & Precautions, NPO status , Patient's Chart, lab work & pertinent test results  Airway Mallampati: II  TM Distance: >3 FB Neck ROM: Full    Dental no notable dental hx. (+) Teeth Intact, Dental Advisory Given   Pulmonary former smoker, PE (on eliquis)   Pulmonary exam normal breath sounds clear to auscultation       Cardiovascular hypertension, Pt. on medications Normal cardiovascular exam Rhythm:Regular Rate:Normal  09/26/21 TTE 1. Left ventricular ejection fraction, by estimation, is >75%. The left  ventricle has hyperdynamic function. Left ventricular endocardial border  not optimally defined to evaluate regional wall motion. There is mild left  ventricular hypertrophy. Left  ventricular diastolic parameters are consistent with Grade I diastolic  dysfunction (impaired relaxation).  2. Right ventricular systolic function is normal. The right ventricular  size is normal. Tricuspid regurgitation signal is inadequate for assessing  PA pressure.  3. The mitral valve is abnormal. No evidence of mitral valve  regurgitation. No evidence of mitral stenosis.  4. The aortic valve is tricuspid. There is moderate calcification of the  aortic valve. There is moderate thickening of the aortic valve. Aortic  valve regurgitation is not visualized. Mild to moderate aortic valve  sclerosis/calcification is present,  without any evidence of aortic stenosis   Neuro/Psych negative neurological ROS  negative psych ROS   GI/Hepatic Neg liver ROS, GERD  Medicated,  Endo/Other  diabetes  Renal/GU Lab Results      Component                Value               Date                      CREATININE               0.76                09/08/2021                BUN                      19                  09/08/2021                NA                        137                 09/08/2021                K                        3.3 (L)             09/08/2021                CL                       102                 09/08/2021                CO2  24                  09/08/2021                Musculoskeletal  (+) Arthritis ,   Abdominal   Peds  Hematology Lab Results      Component                Value               Date                      WBC                      14.8 (H)            09/08/2021                HGB                      12.7                09/08/2021                HCT                      39.6                09/08/2021                MCV                      89.2                09/08/2021                PLT                      407 (H)             09/08/2021              Anesthesia Other Findings All: atenolol  L Breast CA  Reproductive/Obstetrics                            Anesthesia Physical Anesthesia Plan  ASA: 3  Anesthesia Plan: General   Post-op Pain Management:    Induction: Intravenous  PONV Risk Score and Plan: 4 or greater and Treatment may vary due to age or medical condition, Ondansetron and Dexamethasone  Airway Management Planned: Oral ETT  Additional Equipment: None  Intra-op Plan:   Post-operative Plan: Extubation in OR  Informed Consent: I have reviewed the patients History and Physical, chart, labs and discussed the procedure including the risks, benefits and alternatives for the proposed anesthesia with the patient or authorized representative who has indicated his/her understanding and acceptance.     Dental advisory given  Plan Discussed with: CRNA and Anesthesiologist  Anesthesia Plan Comments: (GA w L Pec Block)       Anesthesia Quick Evaluation

## 2021-10-27 ENCOUNTER — Encounter (HOSPITAL_COMMUNITY)
Admission: RE | Disposition: A | Discharge status: Home / Self Care | Payer: Self-pay | Source: Ambulatory Visit | Attending: General Surgery

## 2021-10-27 ENCOUNTER — Ambulatory Visit (HOSPITAL_COMMUNITY): Payer: PPO | Admitting: Anesthesiology

## 2021-10-27 ENCOUNTER — Encounter (HOSPITAL_COMMUNITY): Payer: Self-pay | Admitting: General Surgery

## 2021-10-27 ENCOUNTER — Ambulatory Visit (HOSPITAL_COMMUNITY)
Admission: RE | Admit: 2021-10-27 | Discharge: 2021-10-28 | Disposition: A | Discharge status: Home / Self Care | Payer: PPO | Source: Ambulatory Visit | Attending: General Surgery | Admitting: General Surgery

## 2021-10-27 DIAGNOSIS — Z87891 Personal history of nicotine dependence: Secondary | ICD-10-CM | POA: Insufficient documentation

## 2021-10-27 DIAGNOSIS — E119 Type 2 diabetes mellitus without complications: Secondary | ICD-10-CM | POA: Insufficient documentation

## 2021-10-27 DIAGNOSIS — C50912 Malignant neoplasm of unspecified site of left female breast: Secondary | ICD-10-CM | POA: Diagnosis not present

## 2021-10-27 DIAGNOSIS — I10 Essential (primary) hypertension: Secondary | ICD-10-CM | POA: Diagnosis not present

## 2021-10-27 DIAGNOSIS — Z86711 Personal history of pulmonary embolism: Secondary | ICD-10-CM | POA: Diagnosis not present

## 2021-10-27 DIAGNOSIS — R921 Mammographic calcification found on diagnostic imaging of breast: Secondary | ICD-10-CM | POA: Diagnosis not present

## 2021-10-27 DIAGNOSIS — Z20822 Contact with and (suspected) exposure to covid-19: Secondary | ICD-10-CM | POA: Insufficient documentation

## 2021-10-27 DIAGNOSIS — M1990 Unspecified osteoarthritis, unspecified site: Secondary | ICD-10-CM | POA: Insufficient documentation

## 2021-10-27 DIAGNOSIS — Z7901 Long term (current) use of anticoagulants: Secondary | ICD-10-CM | POA: Insufficient documentation

## 2021-10-27 DIAGNOSIS — Z170 Estrogen receptor positive status [ER+]: Secondary | ICD-10-CM | POA: Insufficient documentation

## 2021-10-27 DIAGNOSIS — I2699 Other pulmonary embolism without acute cor pulmonale: Secondary | ICD-10-CM | POA: Diagnosis not present

## 2021-10-27 DIAGNOSIS — K219 Gastro-esophageal reflux disease without esophagitis: Secondary | ICD-10-CM | POA: Insufficient documentation

## 2021-10-27 DIAGNOSIS — C50412 Malignant neoplasm of upper-outer quadrant of left female breast: Secondary | ICD-10-CM | POA: Diagnosis not present

## 2021-10-27 DIAGNOSIS — G8918 Other acute postprocedural pain: Secondary | ICD-10-CM | POA: Diagnosis not present

## 2021-10-27 DIAGNOSIS — Z79899 Other long term (current) drug therapy: Secondary | ICD-10-CM | POA: Insufficient documentation

## 2021-10-27 HISTORY — PX: MODIFIED MASTECTOMY: SHX5268

## 2021-10-27 LAB — CBC
HCT: 39.5 % (ref 36.0–46.0)
Hemoglobin: 11.9 g/dL — ABNORMAL LOW (ref 12.0–15.0)
MCH: 27.1 pg (ref 26.0–34.0)
MCHC: 30.1 g/dL (ref 30.0–36.0)
MCV: 90 fL (ref 80.0–100.0)
Platelets: 286 10*3/uL (ref 150–400)
RBC: 4.39 MIL/uL (ref 3.87–5.11)
RDW: 13.2 % (ref 11.5–15.5)
WBC: 11.3 10*3/uL — ABNORMAL HIGH (ref 4.0–10.5)
nRBC: 0 % (ref 0.0–0.2)

## 2021-10-27 LAB — BASIC METABOLIC PANEL
Anion gap: 9 (ref 5–15)
BUN: 13 mg/dL (ref 8–23)
CO2: 26 mmol/L (ref 22–32)
Calcium: 9.4 mg/dL (ref 8.9–10.3)
Chloride: 104 mmol/L (ref 98–111)
Creatinine, Ser: 0.7 mg/dL (ref 0.44–1.00)
GFR, Estimated: >60 mL/min (ref >60)
Glucose, Bld: 128 mg/dL — ABNORMAL HIGH (ref 70–99)
Potassium: 3.9 mmol/L (ref 3.5–5.1)
Sodium: 139 mmol/L (ref 135–145)

## 2021-10-27 LAB — GLUCOSE, CAPILLARY
Glucose-Capillary: 133 mg/dL — ABNORMAL HIGH (ref 70–99)
Glucose-Capillary: 138 mg/dL — ABNORMAL HIGH (ref 70–99)

## 2021-10-27 LAB — SARS CORONAVIRUS 2 BY RT PCR (HOSPITAL ORDER, PERFORMED IN ~~LOC~~ HOSPITAL LAB): SARS Coronavirus 2: NEGATIVE

## 2021-10-27 SURGERY — MODIFIED MASTECTOMY
Anesthesia: General | Site: Breast | Laterality: Left

## 2021-10-27 MED ORDER — ONDANSETRON HCL 4 MG/2ML IJ SOLN
INTRAMUSCULAR | Status: AC
  Filled 2021-10-27: qty 2

## 2021-10-27 MED ORDER — DEXMEDETOMIDINE (PRECEDEX) IN NS 20 MCG/5ML (4 MCG/ML) IV SYRINGE
PREFILLED_SYRINGE | INTRAVENOUS | Status: DC | PRN
  Administered 2021-10-27: 4 ug via INTRAVENOUS

## 2021-10-27 MED ORDER — ACETAMINOPHEN 500 MG PO TABS: 1000.0000 mg | ORAL_TABLET | ORAL | Status: AC

## 2021-10-27 MED ORDER — ONDANSETRON HCL 4 MG/2ML IJ SOLN: 4.0000 mg | Freq: Four times a day (QID) | INTRAMUSCULAR | Status: DC | PRN

## 2021-10-27 MED ORDER — ONDANSETRON HCL 4 MG/2ML IJ SOLN
INTRAMUSCULAR | Status: DC | PRN
  Administered 2021-10-27: 4 mg via INTRAVENOUS

## 2021-10-27 MED ORDER — PHENYLEPHRINE 40 MCG/ML (10ML) SYRINGE FOR IV PUSH (FOR BLOOD PRESSURE SUPPORT)
PREFILLED_SYRINGE | INTRAVENOUS | Status: AC
  Filled 2021-10-27: qty 10

## 2021-10-27 MED ORDER — DEXAMETHASONE SODIUM PHOSPHATE 10 MG/ML IJ SOLN
INTRAMUSCULAR | Status: DC | PRN
  Administered 2021-10-27: 4 mg via INTRAVENOUS

## 2021-10-27 MED ORDER — AMLODIPINE BESYLATE 5 MG PO TABS
5.0000 mg | ORAL_TABLET | Freq: Every day | ORAL | Status: DC
  Administered 2021-10-28: 5 mg via ORAL
  Filled 2021-10-27: qty 1

## 2021-10-27 MED ORDER — LACTATED RINGERS IV SOLN: INTRAVENOUS | Status: DC

## 2021-10-27 MED ORDER — VENLAFAXINE HCL ER 75 MG PO CP24
75.0000 mg | ORAL_CAPSULE | Freq: Every day | ORAL | Status: DC
  Administered 2021-10-28: 75 mg via ORAL
  Filled 2021-10-27: qty 1

## 2021-10-27 MED ORDER — LIDOCAINE 2% (20 MG/ML) 5 ML SYRINGE
INTRAMUSCULAR | Status: AC
  Filled 2021-10-27: qty 5

## 2021-10-27 MED ORDER — ONDANSETRON 4 MG PO TBDP: 4.0000 mg | ORAL_TABLET | Freq: Four times a day (QID) | ORAL | Status: DC | PRN

## 2021-10-27 MED ORDER — APIXABAN 5 MG PO TABS
5.0000 mg | ORAL_TABLET | Freq: Two times a day (BID) | ORAL | Status: DC
  Administered 2021-10-28: 5 mg via ORAL
  Filled 2021-10-27: qty 1

## 2021-10-27 MED ORDER — CELECOXIB 200 MG PO CAPS
ORAL_CAPSULE | ORAL | Status: AC
  Administered 2021-10-27: 200 mg via ORAL
  Filled 2021-10-27: qty 1

## 2021-10-27 MED ORDER — GABAPENTIN 300 MG PO CAPS
300.0000 mg | ORAL_CAPSULE | ORAL | Status: AC
  Administered 2021-10-27: 300 mg via ORAL
  Filled 2021-10-27: qty 1

## 2021-10-27 MED ORDER — HYDROMORPHONE HCL 1 MG/ML IJ SOLN
0.2500 mg | INTRAMUSCULAR | Status: DC | PRN
  Administered 2021-10-27: 0.5 mg via INTRAVENOUS

## 2021-10-27 MED ORDER — MORPHINE SULFATE (PF) 2 MG/ML IV SOLN
1.0000 mg | INTRAVENOUS | Status: DC | PRN
  Administered 2021-10-28: 2 mg via INTRAVENOUS
  Filled 2021-10-27: qty 1

## 2021-10-27 MED ORDER — 0.9 % SODIUM CHLORIDE (POUR BTL) OPTIME
TOPICAL | Status: DC | PRN
  Administered 2021-10-27: 1000 mL

## 2021-10-27 MED ORDER — FENTANYL CITRATE (PF) 250 MCG/5ML IJ SOLN
INTRAMUSCULAR | Status: DC | PRN
  Administered 2021-10-27 (×2): 50 ug via INTRAVENOUS
  Administered 2021-10-27 (×2): 25 ug via INTRAVENOUS

## 2021-10-27 MED ORDER — BUPIVACAINE LIPOSOME 1.3 % IJ SUSP
INTRAMUSCULAR | Status: DC | PRN
  Administered 2021-10-27: 10 mL

## 2021-10-27 MED ORDER — ANASTROZOLE 1 MG PO TABS
1.0000 mg | ORAL_TABLET | Freq: Every day | ORAL | Status: DC
  Administered 2021-10-28: 1 mg via ORAL
  Filled 2021-10-27: qty 1

## 2021-10-27 MED ORDER — FENTANYL CITRATE (PF) 100 MCG/2ML IJ SOLN
INTRAMUSCULAR | Status: AC
  Filled 2021-10-27: qty 2

## 2021-10-27 MED ORDER — CHLORHEXIDINE GLUCONATE 0.12 % MT SOLN: 15.0000 mL | Freq: Once | OROMUCOSAL | Status: AC

## 2021-10-27 MED ORDER — CHLORHEXIDINE GLUCONATE CLOTH 2 % EX PADS: 6.0000 | MEDICATED_PAD | Freq: Once | CUTANEOUS | Status: DC

## 2021-10-27 MED ORDER — LIDOCAINE 2% (20 MG/ML) 5 ML SYRINGE
INTRAMUSCULAR | Status: AC
  Filled 2021-10-27: qty 10

## 2021-10-27 MED ORDER — CEFAZOLIN SODIUM-DEXTROSE 2-4 GM/100ML-% IV SOLN
INTRAVENOUS | Status: AC
  Filled 2021-10-27: qty 100

## 2021-10-27 MED ORDER — DEXAMETHASONE SODIUM PHOSPHATE 10 MG/ML IJ SOLN
INTRAMUSCULAR | Status: AC
  Filled 2021-10-27: qty 2

## 2021-10-27 MED ORDER — CHLORHEXIDINE GLUCONATE 0.12 % MT SOLN
OROMUCOSAL | Status: AC
  Administered 2021-10-27: 15 mL via OROMUCOSAL
  Filled 2021-10-27: qty 15

## 2021-10-27 MED ORDER — PANTOPRAZOLE SODIUM 40 MG IV SOLR: 40.0000 mg | Freq: Every day | INTRAVENOUS | Status: DC

## 2021-10-27 MED ORDER — OXYCODONE HCL 5 MG PO TABS: 5.0000 mg | ORAL_TABLET | Freq: Once | ORAL | Status: DC | PRN

## 2021-10-27 MED ORDER — MIDAZOLAM HCL 2 MG/2ML IJ SOLN
INTRAMUSCULAR | Status: AC
  Filled 2021-10-27: qty 2

## 2021-10-27 MED ORDER — ACETAMINOPHEN 500 MG PO TABS
ORAL_TABLET | ORAL | Status: AC
  Administered 2021-10-27: 1000 mg via ORAL
  Filled 2021-10-27: qty 2

## 2021-10-27 MED ORDER — PHENYLEPHRINE 40 MCG/ML (10ML) SYRINGE FOR IV PUSH (FOR BLOOD PRESSURE SUPPORT)
PREFILLED_SYRINGE | INTRAVENOUS | Status: AC
  Filled 2021-10-27: qty 30

## 2021-10-27 MED ORDER — ORAL CARE MOUTH RINSE: 15.0000 mL | Freq: Once | OROMUCOSAL | Status: AC

## 2021-10-27 MED ORDER — HEPARIN SODIUM (PORCINE) 5000 UNIT/ML IJ SOLN: 5000.0000 [IU] | Freq: Three times a day (TID) | INTRAMUSCULAR | Status: DC

## 2021-10-27 MED ORDER — OXYCODONE HCL 5 MG PO TABS: 5.0000 mg | ORAL_TABLET | Freq: Four times a day (QID) | ORAL | 0 refills | Status: DC | PRN

## 2021-10-27 MED ORDER — FENTANYL CITRATE (PF) 250 MCG/5ML IJ SOLN
INTRAMUSCULAR | Status: AC
  Filled 2021-10-27: qty 5

## 2021-10-27 MED ORDER — PANTOPRAZOLE SODIUM 40 MG PO TBEC
40.0000 mg | DELAYED_RELEASE_TABLET | Freq: Two times a day (BID) | ORAL | Status: DC
  Administered 2021-10-28: 40 mg via ORAL
  Filled 2021-10-27: qty 1

## 2021-10-27 MED ORDER — MIDAZOLAM HCL 5 MG/5ML IJ SOLN
INTRAMUSCULAR | Status: DC | PRN
  Administered 2021-10-27: 1 mg via INTRAVENOUS

## 2021-10-27 MED ORDER — PHENYLEPHRINE 40 MCG/ML (10ML) SYRINGE FOR IV PUSH (FOR BLOOD PRESSURE SUPPORT)
PREFILLED_SYRINGE | INTRAVENOUS | Status: DC | PRN
  Administered 2021-10-27 (×2): 80 ug via INTRAVENOUS
  Administered 2021-10-27: 40 ug via INTRAVENOUS
  Administered 2021-10-27 (×2): 80 ug via INTRAVENOUS

## 2021-10-27 MED ORDER — CELECOXIB 200 MG PO CAPS: 200.0000 mg | ORAL_CAPSULE | ORAL | Status: AC

## 2021-10-27 MED ORDER — PROPOFOL 10 MG/ML IV BOLUS
INTRAVENOUS | Status: AC
  Filled 2021-10-27: qty 20

## 2021-10-27 MED ORDER — ROCURONIUM BROMIDE 10 MG/ML (PF) SYRINGE
PREFILLED_SYRINGE | INTRAVENOUS | Status: AC
  Filled 2021-10-27: qty 20

## 2021-10-27 MED ORDER — ROCURONIUM BROMIDE 10 MG/ML (PF) SYRINGE
PREFILLED_SYRINGE | INTRAVENOUS | Status: DC | PRN
  Administered 2021-10-27: 40 mg via INTRAVENOUS

## 2021-10-27 MED ORDER — SUGAMMADEX SODIUM 200 MG/2ML IV SOLN
INTRAVENOUS | Status: DC | PRN
  Administered 2021-10-27 (×2): 100 mg via INTRAVENOUS

## 2021-10-27 MED ORDER — OXYCODONE HCL 5 MG/5ML PO SOLN: 5.0000 mg | Freq: Once | ORAL | Status: DC | PRN

## 2021-10-27 MED ORDER — ONDANSETRON HCL 4 MG/2ML IJ SOLN: 4.0000 mg | Freq: Once | INTRAMUSCULAR | Status: DC | PRN

## 2021-10-27 MED ORDER — HYDROMORPHONE HCL 1 MG/ML IJ SOLN
INTRAMUSCULAR | Status: AC
  Filled 2021-10-27: qty 1

## 2021-10-27 MED ORDER — PROPOFOL 10 MG/ML IV BOLUS
INTRAVENOUS | Status: DC | PRN
  Administered 2021-10-27: 90 mg via INTRAVENOUS

## 2021-10-27 MED ORDER — ACETAMINOPHEN 10 MG/ML IV SOLN: 1000.0000 mg | Freq: Once | INTRAVENOUS | Status: DC | PRN

## 2021-10-27 MED ORDER — LORAZEPAM 0.5 MG PO TABS
0.5000 mg | ORAL_TABLET | ORAL | Status: DC | PRN
  Administered 2021-10-28: 0.5 mg via ORAL
  Filled 2021-10-27: qty 1

## 2021-10-27 MED ORDER — SODIUM CHLORIDE 0.9 % IV SOLN: INTRAVENOUS | Status: DC

## 2021-10-27 MED ORDER — EPHEDRINE 5 MG/ML INJ
INTRAVENOUS | Status: AC
  Filled 2021-10-27: qty 10

## 2021-10-27 MED ORDER — METHOCARBAMOL 500 MG PO TABS: 500.0000 mg | ORAL_TABLET | Freq: Four times a day (QID) | ORAL | 2 refills | Status: DC | PRN

## 2021-10-27 MED ORDER — SUCCINYLCHOLINE CHLORIDE 200 MG/10ML IV SOSY
PREFILLED_SYRINGE | INTRAVENOUS | Status: AC
  Filled 2021-10-27: qty 10

## 2021-10-27 MED ORDER — LIDOCAINE 2% (20 MG/ML) 5 ML SYRINGE
INTRAMUSCULAR | Status: DC | PRN
  Administered 2021-10-27: 60 mg via INTRAVENOUS

## 2021-10-27 MED ORDER — OXYCODONE HCL 5 MG PO TABS
5.0000 mg | ORAL_TABLET | ORAL | Status: DC | PRN
  Administered 2021-10-28: 10 mg via ORAL
  Filled 2021-10-27: qty 2

## 2021-10-27 MED ORDER — METHOCARBAMOL 500 MG PO TABS: 500.0000 mg | ORAL_TABLET | Freq: Four times a day (QID) | ORAL | Status: DC | PRN

## 2021-10-27 MED ORDER — CEFAZOLIN SODIUM-DEXTROSE 2-4 GM/100ML-% IV SOLN
2.0000 g | INTRAVENOUS | Status: AC
  Administered 2021-10-27: 2 g via INTRAVENOUS

## 2021-10-27 MED ORDER — BUPIVACAINE HCL (PF) 0.5 % IJ SOLN
INTRAMUSCULAR | Status: DC | PRN
  Administered 2021-10-27: 20 mL

## 2021-10-27 MED ORDER — DEXMEDETOMIDINE (PRECEDEX) IN NS 20 MCG/5ML (4 MCG/ML) IV SYRINGE
PREFILLED_SYRINGE | INTRAVENOUS | Status: AC
  Filled 2021-10-27: qty 20

## 2021-10-27 SURGICAL SUPPLY — 48 items
ADH SKN CLS APL DERMABOND .7 (GAUZE/BANDAGES/DRESSINGS) ×1
APL PRP STRL LF DISP 70% ISPRP (MISCELLANEOUS) ×1
APPLIER CLIP 9.375 MED OPEN (MISCELLANEOUS) ×4
APR CLP MED 9.3 20 MLT OPN (MISCELLANEOUS) ×2
BAG COUNTER SPONGE SURGICOUNT (BAG) ×2 IMPLANT
BAG SPNG CNTER NS LX DISP (BAG) ×1
BINDER BREAST LRG (GAUZE/BANDAGES/DRESSINGS) IMPLANT
BINDER BREAST XLRG (GAUZE/BANDAGES/DRESSINGS) IMPLANT
BIOPATCH RED 1 DISK 7.0 (GAUZE/BANDAGES/DRESSINGS) ×3 IMPLANT
CANISTER SUCT 3000ML PPV (MISCELLANEOUS) ×2 IMPLANT
CHLORAPREP W/TINT 26 (MISCELLANEOUS) ×2 IMPLANT
CLIP APPLIE 9.375 MED OPEN (MISCELLANEOUS) ×1 IMPLANT
COVER SURGICAL LIGHT HANDLE (MISCELLANEOUS) ×2 IMPLANT
DERMABOND ADVANCED (GAUZE/BANDAGES/DRESSINGS) ×1
DERMABOND ADVANCED .7 DNX12 (GAUZE/BANDAGES/DRESSINGS) ×1 IMPLANT
DEVICE DSSCT PLSMBLD 3.0S LGHT (MISCELLANEOUS) IMPLANT
DRAIN CHANNEL 19F RND (DRAIN) ×3 IMPLANT
DRAPE CHEST BREAST 15X10 FENES (DRAPES) ×2 IMPLANT
DRSG PAD ABDOMINAL 8X10 ST (GAUZE/BANDAGES/DRESSINGS) ×3 IMPLANT
DRSG TEGADERM 4X4.75 (GAUZE/BANDAGES/DRESSINGS) ×3 IMPLANT
ELECT CAUTERY BLADE 6.4 (BLADE) ×2 IMPLANT
ELECT REM PT RETURN 9FT ADLT (ELECTROSURGICAL) ×2
ELECTRODE REM PT RTRN 9FT ADLT (ELECTROSURGICAL) ×1 IMPLANT
EVACUATOR SILICONE 100CC (DRAIN) ×3 IMPLANT
FILTER IN LINE W/DETACHED HOSE (FILTER) ×2 IMPLANT
GAUZE SPONGE 4X4 12PLY STRL (GAUZE/BANDAGES/DRESSINGS) ×2 IMPLANT
GAUZE XEROFORM 5X9 LF (GAUZE/BANDAGES/DRESSINGS) IMPLANT
GLOVE SURG ENC MOIS LTX SZ7.5 (GLOVE) ×2 IMPLANT
GOWN STRL REUS W/ TWL LRG LVL3 (GOWN DISPOSABLE) ×2 IMPLANT
GOWN STRL REUS W/TWL LRG LVL3 (GOWN DISPOSABLE) ×4
KIT BASIN OR (CUSTOM PROCEDURE TRAY) ×2 IMPLANT
KIT TURNOVER KIT B (KITS) ×2 IMPLANT
LIGHT WAVEGUIDE WIDE FLAT (MISCELLANEOUS) IMPLANT
NS IRRIG 1000ML POUR BTL (IV SOLUTION) ×2 IMPLANT
PACK GENERAL/GYN (CUSTOM PROCEDURE TRAY) ×2 IMPLANT
PAD ARMBOARD 7.5X6 YLW CONV (MISCELLANEOUS) ×4 IMPLANT
PENCIL SMOKE EVACUATOR (MISCELLANEOUS) ×2 IMPLANT
PLASMABLADE 3.0S W/LIGHT (MISCELLANEOUS) ×2
SPECIMEN JAR X LARGE (MISCELLANEOUS) ×2 IMPLANT
STAPLER VISISTAT 35W (STAPLE) IMPLANT
SUT ETHILON 3 0 FSL (SUTURE) ×3 IMPLANT
SUT MNCRL AB 4-0 PS2 18 (SUTURE) ×2 IMPLANT
SUT SILK 4 0 (SUTURE) ×2
SUT SILK 4-0 18XBRD TIE 12 (SUTURE) ×1 IMPLANT
SUT VIC AB 3-0 SH 18 (SUTURE) ×3 IMPLANT
TOWEL GREEN STERILE (TOWEL DISPOSABLE) ×2 IMPLANT
TOWEL GREEN STERILE FF (TOWEL DISPOSABLE) ×2 IMPLANT
TUBE CONNECTING 12X1/4 (SUCTIONS) ×1 IMPLANT

## 2021-10-27 NOTE — Interval H&P Note (Signed)
History and Physical Interval Note:  10/27/2021 2:08 PM  Brittney Carroll  has presented today for surgery, with the diagnosis of LEFT BREAST CANCER.  The various methods of treatment have been discussed with the patient and family. After consideration of risks, benefits and other options for treatment, the patient has consented to  Procedure(s): LEFT MODIFIED RADICAL MASTECTOMY (Left) as a surgical intervention.  The patient's history has been reviewed, patient examined, no change in status, stable for surgery.  I have reviewed the patient's chart and labs.  Questions were answered to the patient's satisfaction.     Autumn Messing III

## 2021-10-27 NOTE — Anesthesia Procedure Notes (Signed)
Anesthesia Regional Block: Pectoralis block   Pre-Anesthetic Checklist: , timeout performed,  Correct Patient, Correct Site, Correct Laterality,  Correct Procedure, Correct Position, site marked,  Risks and benefits discussed,  Surgical consent,  Pre-op evaluation,  At surgeon's request and post-op pain management  Laterality: Left and Upper  Prep: chloraprep       Needles:  Injection technique: Single-shot  Needle Type: Echogenic Needle     Needle Length: 9cm  Needle Gauge: 21     Additional Needles:   Procedures:,,,, ultrasound used (permanent image in chart),,    Narrative:  Start time: 10/27/2021 2:13 PM End time: 10/27/2021 2:20 PM Injection made incrementally with aspirations every 5 mL.  Performed by: Personally  Anesthesiologist: Barnet Glasgow, MD  Additional Notes: Block assessed. Patient tolerated procedure well.

## 2021-10-27 NOTE — Op Note (Signed)
10/27/2021  4:24 PM  PATIENT:  Brittney Carroll  75 y.o. female  PRE-OPERATIVE DIAGNOSIS:  LEFT BREAST CANCER  POST-OPERATIVE DIAGNOSIS:  LEFT BREAST CANCER  PROCEDURE:  Procedure(s): LEFT MODIFIED RADICAL MASTECTOMY (Left)  SURGEON:  Surgeon(s) and Role:    * Jovita Kussmaul, MD - Primary  PHYSICIAN ASSISTANT:   ASSISTANTS: Pryor Curia, RNFA   ANESTHESIA:   general  EBL:  20 mL   BLOOD ADMINISTERED:none  DRAINS: (1) Blake drain(s) in the prepectoral space    LOCAL MEDICATIONS USED:  NONE  SPECIMEN:  Source of Specimen:  left mastectomy with axillary contents  DISPOSITION OF SPECIMEN:  PATHOLOGY  COUNTS:  YES  TOURNIQUET:  * No tourniquets in log *  DICTATION: .Dragon Dictation  After informed consent was obtained the patient was brought to the operating room and placed in the supine position on the operating table.  After adequate induction of general anesthesia the patient's left chest, breast, and axillary area were prepped with ChloraPrep, allowed to dry, and draped in usual sterile manner.  An appropriate timeout was performed.  An elliptical incision was then made around the nipple and areola complex in order to minimize the excess skin.  The incision was carried through the skin and subcutaneous tissue sharply with the PlasmaBlade.  Breast hooks were used to elevate the skin flaps anteriorly towards the ceiling.  Thin skin flaps were then created by dissecting between the breast tissue and the subcutaneous fat and skin.  This dissection was carried all the way to the chest wall circumferentially.  Next the breast was removed from the pectoralis muscle with the pectoralis fascia.  A couple of small perforating vessels medially were controlled with 3-0 Vicryl stitches.  Laterally the dissection was carried all the way down to the latissimus muscle.  As the breast was removed from the chest wall we were thenable to identify the serratus muscle medially.  Superiorly we were  able to identify the left axillary vein.  The contents of the axilla within these boundaries was then dissected out by blunt right angle dissection.  Several small vessels and intercostal brachial nerves were controlled with clips.  In the long thoracic and thoracodorsal nerves were identified and spared.  Once this was accomplished then the entire left breast with the axillary contents was removed from the patient.  This tissue was then all sent to pathology for further evaluation.  Hemostasis was achieved using the PlasmaBlade.  The wound was irrigated with saline.  A small stab incision was made near the anterior axillary line inferior to the operative bed.  A tonsil clamp was placed through this opening and used to bring a 19 [place] round Blake drain into the operative bed.  The drain was curled along the chest wall.  The drain was anchored to the skin with a 3-0 nylon stitch.  Next the superior and inferior skin flaps were grossly reapproximated with interrupted 3-0 Vicryl stitches.  The skin was then closed with running 4-0 Monocryl subcuticular stitches.  Dermabond dressings and sterile drain dressings were applied.  The drain was placed to bulb suction and there was a good seal.  The patient tolerated the procedure well.  At the end of the case all needle sponge and instrument counts were correct.  The patient was then awakened and taken to recovery in stable condition.  PLAN OF CARE: Admit for overnight observation  PATIENT DISPOSITION:  PACU - hemodynamically stable.   Delay start of Pharmacological VTE agent (>  24hrs) due to surgical blood loss or risk of bleeding: no

## 2021-10-27 NOTE — H&P (Signed)
REFERRING PHYSICIAN: Theodoro Kos, DO  PROVIDER: Landry Corporal, MD  MRN: [id number] DOB: [birth year]  Subjective   Chief Complaint: breast   History of Present Illness: Brittney Carroll is a 75 y.o. female who is seen today as an office consultation at the request of Dr. Migdalia Dk for evaluation of breast .   We are asked to see the patient in consultation by Dr. Marla Roe to evaluate her for a left breast cancer. The patient is a 75 year old white female who was diagnosed with a locally advanced left breast cancer back in November. The cancer was hormone receptor positive and HER2 negative. It measures somewhere around 8 to 10 cm initially but seems to be smaller now. She had 1 positive lymph node. She was started on neoadjuvant hormonal therapy and has had a good response. She was then referred to [place] for concerns of whether a mastectomy could be closed. She does report a lot of joint tenderness from her arthritis. She is on Eliquis for a history of pulmonary embolus  Review of Systems: A complete review of systems was obtained from the patient. I have reviewed this information and discussed as appropriate with the patient. See HPI as well for other ROS.  ROS   Medical History: Past Medical History:  Diagnosis Date   Anxiety   Arthritis   History of cancer   Hypertension   Patient Active Problem List  Diagnosis   Malignant neoplasm of upper-outer quadrant of left breast in female, estrogen receptor positive (CMS-HCC)   Past Surgical History:  Procedure Laterality Date   HYSTERECTOMY SUPRACERVICAL ABDOMINAL W/REMOVAL TUBES &/OR OVARIES    Allergies  Allergen Reactions   Atenolol Other (See Comments)  Makes BP drop suddenly and feels lifeless   Current Outpatient Medications on File Prior to Visit  Medication Sig Dispense Refill   apixaban 5 mg (74 tabs) DsPk Take as directed on package: start with two-62m tablets twice daily for 7 days. On day 8, switch to one-556mtablet  twice daily.   cholecalciferol (VITAMIN D3) 1000 unit tablet Take by mouth   ferrous sulfate 325 (65 FE) MG tablet Take 325 mg by mouth daily with breakfast   ondansetron (ZOFRAN) 8 MG tablet Take 8 mg by mouth every 8 (eight) hours as needed   polyethylene glycol (MIRALAX) powder Take by mouth   amLODIPine (NORVASC) 5 MG tablet   anastrozole (ARIMIDEX) 1 mg tablet   heparin, porcine, 5,000 unit/mL injection   HYDROmorphone (DILAUDID) 2 MG tablet   LORazepam (ATIVAN) 0.5 MG tablet   methocarbamoL (ROBAXIN) 500 MG tablet   olmesartan (BENICAR) 40 MG tablet   omeprazole (PRILOSEC) 20 MG DR capsule   pantoprazole (PROTONIX) 40 MG DR tablet   prochlorperazine (COMPAZINE) 10 MG tablet   venlafaxine (EFFEXOR-XR) 75 MG XR capsule   No current facility-administered medications on file prior to visit.   Family History  Problem Relation Age of Onset   Coronary Artery Disease (Blocked arteries around heart) Mother   Skin cancer Sister   Obesity Sister   High blood pressure (Hypertension) Sister   Breast cancer Sister    Social History   Tobacco Use  Smoking Status Former Smoker   Quit date: 1972   Years since quitting: 5061.6Smokeless Tobacco Never Used    Social History   Socioeconomic History   Marital status: Unknown  Tobacco Use   Smoking status: Former Smoker  Quit date: 1972  Years since quitting: 50.6   Smokeless tobacco: Never  Used  Substance and Sexual Activity   Alcohol use: Never   Drug use: Never   Objective:   Vitals:  BP: (!) 140/86  Pulse: 93  Temp: 36.6 C (97.8 F)  SpO2: 97%  Weight: 60.8 kg (134 lb)  Height: 168.9 cm (5' 6.5")   Body mass index is 21.3 kg/m.  Physical Exam Vitals reviewed.  Constitutional:  General: She is not in acute distress. Appearance: Normal appearance.  HENT:  Head: Normocephalic and atraumatic.  Right Ear: External ear normal.  Left Ear: External ear normal.  Nose: Nose normal.  Mouth/Throat:  Mouth: Mucous  membranes are moist.  Pharynx: Oropharynx is clear.  Eyes:  General: No scleral icterus. Extraocular Movements: Extraocular movements intact.  Conjunctiva/sclera: Conjunctivae normal.  Pupils: Pupils are equal, round, and reactive to light.  Cardiovascular:  Rate and Rhythm: Normal rate and regular rhythm.  Pulses: Normal pulses.  Heart sounds: Normal heart sounds.  Pulmonary:  Effort: Pulmonary effort is normal. No respiratory distress.  Breath sounds: Normal breath sounds.  Abdominal:  General: Bowel sounds are normal.  Palpations: Abdomen is soft.  Tenderness: There is no abdominal tenderness.  Musculoskeletal:  General: Tenderness present. No swelling or deformity. Normal range of motion.  Cervical back: Normal range of motion and neck supple.  Skin: General: Skin is warm and dry.  Coloration: Skin is not jaundiced.  Neurological:  General: No focal deficit present.  Mental Status: She is alert and oriented to person, place, and time.  Psychiatric:  Mood and Affect: Mood normal.  Behavior: Behavior normal.   Breast: There is a 6 to 7 cm palpable mass in the upper outer quadrant of the left breast involving the skin. The mass is mobile and does not appear to be tethered to the chest wall. There is no palpable mass in the right breast. There is no palpable axillary, supraclavicular, or cervical lymphadenopathy.  Labs, Imaging and Diagnostic Testing:  Assessment and Plan:  Diagnoses and all orders for this visit:  Malignant neoplasm of upper-outer quadrant of left breast in female, estrogen receptor positive (CMS-HCC) - [facility] Case Posting Request; Future    The patient appears to have a locally advanced cancer in the upper outer quadrant of the left breast. She comes to [place] for a second opinion. I do think she would benefit given the size of the mass from a mastectomy. She would likely need an axillary lymph node dissection as well. I believe that the incision will close and  she will have plenty of skin to cover the chest wall. I have discussed with her in detail the risks and benefits of the operation as well as some of the technical aspects and she understands and wishes to proceed. We will contact her medical doctor to make sure she can come off the Eliquis for surgery. She will also need to continue to follow-up with medical and radiation oncology for adjuvant therapy

## 2021-10-27 NOTE — Progress Notes (Signed)
   10/27/21 2049  Assess: MEWS Score  BP 129/81  Pulse Rate (!) 105  Resp 18  SpO2 100 %  O2 Device Nasal Cannula  O2 Flow Rate (L/min) 2 L/min  Assess: MEWS Score  MEWS Temp 0  MEWS Systolic 0  MEWS Pulse 1  MEWS RR 0  MEWS LOC 1  MEWS Score 2  MEWS Score Color Yellow  Assess: if the MEWS score is Yellow or Red  Were vital signs taken at a resting state? Yes  Focused Assessment No change from prior assessment  Early Detection of Sepsis Score *See Row Information* Low  MEWS guidelines implemented *See Row Information* Yes  Treat  Pain Scale 0-10  Pain Score Asleep  Take Vital Signs  Increase Vital Sign Frequency  Yellow: Q 2hr X 2 then Q 4hr X 2, if remains yellow, continue Q 4hrs  Escalate  MEWS: Escalate Yellow: discuss with charge nurse/RN and consider discussing with provider and RRT  Notify: Charge Nurse/RN  Name of Charge Nurse/RN Notified josephine, RN  Date Charge Nurse/RN Notified 10/27/21  Time Charge Nurse/RN Notified 2049  Notify: Provider  Provider Name/Title Michaelle Birks MD  Date Provider Notified 10/27/21  Time Provider Notified 2132  Notification Type Page  Provider response No new orders  Date of Provider Response 10/27/21  Time of Provider Response 2133

## 2021-10-27 NOTE — Anesthesia Procedure Notes (Signed)
Procedure Name: Intubation Date/Time: 10/27/2021 2:57 PM Performed by: Inda Coke, CRNA Pre-anesthesia Checklist: Patient identified, Emergency Drugs available, Suction available and Patient being monitored Patient Re-evaluated:Patient Re-evaluated prior to induction Oxygen Delivery Method: Circle System Utilized Preoxygenation: Pre-oxygenation with 100% oxygen Induction Type: IV induction Ventilation: Mask ventilation without difficulty Laryngoscope Size: Mac and 3 Grade View: Grade I Tube type: Oral Tube size: 7.0 mm Number of attempts: 1 Airway Equipment and Method: Stylet Placement Confirmation: ETT inserted through vocal cords under direct vision, positive ETCO2 and breath sounds checked- equal and bilateral Secured at: 22 cm Tube secured with: Tape Dental Injury: Teeth and Oropharynx as per pre-operative assessment

## 2021-10-27 NOTE — Transfer of Care (Signed)
Immediate Anesthesia Transfer of Care Note  Patient: Brittney Carroll  Procedure(s) Performed: LEFT MODIFIED RADICAL MASTECTOMY (Left: Breast)  Patient Location: PACU  Anesthesia Type:General and Regional  Level of Consciousness: drowsy and patient cooperative  Airway & Oxygen Therapy: Patient Spontanous Breathing and Patient connected to face mask oxygen  Post-op Assessment: Report given to RN and Post -op Vital signs reviewed and stable  Post vital signs: Reviewed and stable  Last Vitals:  Vitals Value Taken Time  BP 176/94 10/27/21 1643  Temp    Pulse 86 10/27/21 1646  Resp 14 10/27/21 1646  SpO2 100 % 10/27/21 1646  Vitals shown include unvalidated device data.  Last Pain:  Vitals:   10/27/21 1245  PainSc: 5       Patients Stated Pain Goal: 3 (96/28/36 6294)  Complications: No notable events documented.

## 2021-10-28 ENCOUNTER — Encounter (HOSPITAL_COMMUNITY): Payer: Self-pay | Admitting: General Surgery

## 2021-10-28 DIAGNOSIS — Z7401 Bed confinement status: Secondary | ICD-10-CM | POA: Diagnosis not present

## 2021-10-28 DIAGNOSIS — R457 State of emotional shock and stress, unspecified: Secondary | ICD-10-CM | POA: Diagnosis not present

## 2021-10-28 DIAGNOSIS — C50412 Malignant neoplasm of upper-outer quadrant of left female breast: Secondary | ICD-10-CM | POA: Diagnosis not present

## 2021-10-28 DIAGNOSIS — M2550 Pain in unspecified joint: Secondary | ICD-10-CM | POA: Diagnosis not present

## 2021-10-28 MED ORDER — OXYCODONE HCL 5 MG PO TABS: 5.0000 mg | ORAL_TABLET | Freq: Four times a day (QID) | ORAL | 0 refills | Status: AC | PRN

## 2021-10-28 MED ORDER — HYDROCORTISONE (PERIANAL) 2.5 % EX CREA: 1.0000 "application " | TOPICAL_CREAM | Freq: Two times a day (BID) | CUTANEOUS | 0 refills | Status: DC | PRN

## 2021-10-28 NOTE — TOC Progression Note (Addendum)
Transition of Care [facility]) - Progression Note    Patient Details  Name: Brittney Carroll MRN: [id number] Date of Birth: 1946-01-23  Transition of Care [facility]) CM/SW [place], [place] Phone Number: 10/28/2021, 11:11 AM  Clinical Narrative:    Pt will need insurance auth for transportation to facility.  [employer] may not received insurance auth for transportation today.  Will continue to monitor and update.  TOC will continue to assist with disposition needs.  1:15pm : Josem Kaufmann for transportation is [phone] starting 10/28/21   Expected Discharge Plan: Long Term Nursing Home Barriers to Discharge: No Barriers Identified  Expected Discharge Plan and Services Expected Discharge Plan: Long Term Nursing Home In-house Referral: Clinical Social Work       Expected Discharge Date: 10/28/21                                     Social Determinants of Health (SDOH) Interventions    Readmission Risk Interventions Readmission Risk Prevention Plan 12/12/2020  Post Dischage Appt Complete  Medication Screening Complete  Transportation Screening Complete  Some recent data might be hidden

## 2021-10-28 NOTE — TOC Initial Note (Signed)
Transition of Care [facility]) - Initial/Assessment Note    Patient Details  Name: Brittney Carroll MRN: [id number] Date of Birth: [birth year]  Transition of Care [facility]) CM/SW Contact:    Gabrielle Dare Phone Number: 10/28/2021, 10:11 AM  Clinical Narrative:                 CSW spoke with pt.  Patient has been at [facility] for almost a year.  Pt stated" I will be there until I died".  Pt is long term at [facility] and wish to return there at discharge.  Expected Discharge Plan: Long Term Nursing Home Barriers to Discharge: Continued Medical Work up, Ship broker   Patient Goals and CMS Choice Patient states their goals for this hospitalization and ongoing recovery are:: " will live there for the rest of her life" CMS Medicare.gov Compare Post Acute Care list provided to:: Other (Comment Required) (Returning to [place] term) Choice offered to / list presented to : NA  Expected Discharge Plan and Services Expected Discharge Plan: Long Term Nursing Home In-house Referral: Clinical Social Work       Expected Discharge Date: 10/28/21                                    Prior Living Arrangements/Services   Lives with:: Facility Resident Patient language and need for interpreter reviewed:: Yes Do you feel safe going back to the place where you live?: Yes      Need for Family Participation in Patient Care: Yes (Comment) Care giver support system in place?: Yes (comment)   Criminal Activity/Legal Involvement Pertinent to Current Situation/Hospitalization: No - Comment as needed  Activities of Daily Living Home Assistive Devices/Equipment: Wheelchair ADL Screening (condition at time of admission) Patient's cognitive ability adequate to safely complete daily activities?: Yes Is the patient deaf or have difficulty hearing?: No Does the patient have difficulty seeing, even when wearing glasses/contacts?: No Does the patient have difficulty  concentrating, remembering, or making decisions?: No Patient able to express need for assistance with ADLs?: Yes Does the patient have difficulty dressing or bathing?: No Independently performs ADLs?: No Communication: Independent Dressing (OT): Needs assistance Is this a change from baseline?: Pre-admission baseline Grooming: Needs assistance Is this a change from baseline?: Pre-admission baseline Feeding: Independent Bathing: Needs assistance Is this a change from baseline?: Pre-admission baseline Toileting: Dependent Is this a change from baseline?: Pre-admission baseline In/Out Bed: Dependent Is this a change from baseline?: Pre-admission baseline Walks in Home: Dependent Is this a change from baseline?: Pre-admission baseline Does the patient have difficulty walking or climbing stairs?: Yes Weakness of Legs: Both Weakness of Arms/Hands: None  Permission Sought/Granted Permission sought to share information with : Case Manager, Customer service manager, Family Supports Permission granted to share information with : Yes, Verbal Permission Granted  Share Information with NAME: Blanchard Mane  Permission granted to share info w AGENCY: [place] granted to share info w Relationship: Daughter  Permission granted to share info w Contact Information: Yes  Emotional Assessment Appearance:: Appears stated age Attitude/Demeanor/Rapport: Engaged Affect (typically observed): Appropriate Orientation: : Oriented to Self, Oriented to Place, Oriented to  Time, Oriented to Situation Alcohol / Substance Use: Not Applicable Psych Involvement: No (comment)  Admission diagnosis:  Cancer of left female breast [facility]) [C50.912] Patient Active Problem List   Diagnosis Date Noted   Cancer of left female  breast ([place]) 10/27/2021   Breast cancer of upper-outer quadrant of left female breast ([place]) 02/24/2021   Pulmonary embolism ([place]) 01/23/2021   Breast mass in female     Symptomatic anemia    Goals of care, counseling/discussion    Palliative care by specialist    GI bleed 12/09/2020   PCP:  Celene Squibb, MD Pharmacy:   [place], [address] [place] [address] Phone: [phone] Fax: [phone]     Social Determinants of Health (SDOH) Interventions    Readmission Risk Interventions Readmission Risk Prevention Plan 12/12/2020  Post Dischage Appt Complete  Medication Screening Complete  Transportation Screening Complete  Some recent data might be hidden

## 2021-10-28 NOTE — Discharge Instructions (Signed)
[facility] [phone]  MASTECTOMY: POST OP INSTRUCTIONS  Always review your discharge instruction sheet given to you by the facility where your surgery was performed. IF YOU HAVE DISABILITY OR FAMILY LEAVE FORMS, YOU MUST BRING THEM TO THE OFFICE FOR PROCESSING.   DO NOT GIVE THEM TO YOUR DOCTOR. A prescription for pain medication may be given to you upon discharge.  Take your pain medication as prescribed, if needed.  If narcotic pain medicine is not needed, then you may take acetaminophen (Tylenol) or ibuprofen (Advil) as needed. Take your usually prescribed medications unless otherwise directed. If you need a refill on your pain medication, please contact your pharmacy.  They will contact our office to request authorization.  Prescriptions will not be filled after 5pm or on week-ends. You should follow a light diet the first few days after arrival home, such as soup and crackers, etc.  Resume your normal diet the day after surgery. Most patients will experience some swelling and bruising on the chest and underarm.  Ice packs will help.  Swelling and bruising can take several days to resolve.  It is common to experience some constipation if taking pain medication after surgery.  Increasing fluid intake and taking a stool softener (such as Colace) will usually help or prevent this problem from occurring.  A mild laxative (Milk of Magnesia or Miralax) should be taken according to package instructions if there are no bowel movements after 48 hours. Unless discharge instructions indicate otherwise, leave your bandage dry and in place until your next appointment in 3-5 days.  You may take a limited sponge bath.  No tube baths or showers until the drains are removed.  You may have steri-strips (small skin tapes) in place directly over the incision.  These strips should be left on the skin for 7-10 days.  If your surgeon used skin glue on the incision, you may shower in 24 hours.   The glue will flake off over the next 2-3 weeks.  Any sutures or staples will be removed at the office during your follow-up visit. DRAINS:  If you have drains in place, it is important to keep a list of the amount of drainage produced each day in your drains.  Before leaving the hospital, you should be instructed on drain care.  Call our office if you have any questions about your drains. ACTIVITIES:  You may resume regular (light) daily activities beginning the next day--such as daily self-care, walking, climbing stairs--gradually increasing activities as tolerated.  You may have sexual intercourse when it is comfortable.  Refrain from any heavy lifting or straining until approved by your doctor. You may drive when you are no longer taking prescription pain medication, you can comfortably wear a seatbelt, and you can safely maneuver your car and apply brakes. RETURN TO WORK:  __________________________________________________________ You should see your doctor in the office for a follow-up appointment approximately 3-5 days after your surgery.  Your doctor's nurse will typically make your follow-up appointment when she calls you with your pathology report.  Expect your pathology report 2-3 business days after your surgery.  You may call to check if you do not hear from us after three days.   OTHER INSTRUCTIONS: ______________________________________________________________________________________________ ____________________________________________________________________________________________ WHEN TO CALL YOUR DOCTOR: Fever over 101.0 Nausea and/or vomiting Extreme swelling or bruising Continued bleeding from incision. Increased pain, redness, or drainage from the incision. The clinic staff is available to answer your questions during regular business hours.  Please don't hesitate   to call and ask to speak to one of the nurses for clinical concerns.  If you have a medical emergency, go to the  nearest emergency room or call 911.  A surgeon from [facility] is always on call at the hospital. [address], [address], [place], [place]  [address] ? [address] [address], [place], [place]   [address] [phone] ? [phone] ? FAX [phone] Web site: [email or web address]  

## 2021-10-28 NOTE — Discharge Summary (Signed)
Physician Discharge Summary  Patient ID: Brittney Carroll MRN: [id number] DOB/AGE: 75-Jun-1947 75 y.o.  Admit date: 10/27/2021 Discharge date: 10/28/2021  Admission Diagnoses:  Discharge Diagnoses:  Active Problems:   Cancer of left female breast [facility])   Discharged Condition: good  Hospital Course: Pt did well after mastectomy. She had some transient tachycardia overnight but normalized overnight. She had good pain control and tolerated her diet. She had no hematoma and minimal drain output.    Consults: None     Treatments: surgery: left  modified radical   Discharge Exam: Blood pressure (!) 155/87, pulse (!) 101, temperature 98.9 F (37.2 C), temperature source Oral, resp. rate 18, height 5\' 8"  (1.727 m), weight 61 kg, SpO2 99 %. General appearance: alert and cooperative Resp: clear to auscultation bilaterally Cardio: minimal tachycardia   Incision/Wound:CDI no hematoma flaps viable drain serosanguinous   Disposition: SNIF   Discharge Instructions     Diet - low sodium heart healthy   Complete by: As directed    Increase activity slowly   Complete by: As directed       Allergies as of 10/28/2021       Reactions   Atenolol Other (See Comments)   Makes BP drop suddenly and feels lifeless        Medication List     STOP taking these medications    acetaminophen 325 MG tablet Commonly known as: TYLENOL   HYDROmorphone 2 MG tablet Commonly known as: Dilaudid   oxyCODONE HCl 10 MG/0.5ML Conc Replaced by: oxyCODONE 5 MG immediate release tablet       TAKE these medications    amLODipine 5 MG tablet Commonly known as: NORVASC Take 1 tablet (5 mg total) by mouth daily.   anastrozole 1 MG tablet Commonly known as: ARIMIDEX Take 1 tablet (1 mg total) by mouth daily.   cholecalciferol 25 MCG (1000 UNIT) tablet Commonly known as: VITAMIN D3 Take 1 tablet (1,000 Units total) by mouth daily.   docusate sodium 100 MG capsule Commonly known as:  COLACE Take 100 mg by mouth 2 (two) times daily. (0900 & 2100)   Eliquis 5 MG Tabs tablet Generic drug: apixaban Take 5 mg by mouth 2 (two) times daily. (0900 & 2100)   ferrous sulfate 325 (65 FE) MG tablet Take 1 tablet (325 mg total) by mouth daily with breakfast.   LORazepam 0.5 MG tablet Commonly known as: ATIVAN Take 0.5 mg by mouth every 3 (three) hours as needed (agitation/anxiety).   methocarbamol 500 MG tablet Commonly known as: ROBAXIN Take 500 mg by mouth in the morning, at noon, in the evening, and at bedtime. (0900, 1300, 1700 & 2100) What changed: Another medication with the same name was added. Make sure you understand how and when to take each.   methocarbamol 500 MG tablet Commonly known as: Robaxin Take 1 tablet (500 mg total) by mouth every 6 (six) hours as needed for muscle spasms. What changed: You were already taking a medication with the same name, and this prescription was added. Make sure you understand how and when to take each.   NUTRITIONAL SUPPLEMENT PO Take 1 Dose by mouth with breakfast, with lunch, and with evening meal. Health Shake (0800, 1200 & 1700)   olmesartan 40 MG tablet Commonly known as: BENICAR Take 40 mg by mouth daily.   omeprazole 20 MG capsule Commonly known as: PRILOSEC Take 20 mg by mouth 2 (two) times daily. (0800 & 2000)   ondansetron 8 MG tablet  Commonly known as: ZOFRAN Take 8 mg by mouth every 4 (four) hours as needed for vomiting or nausea.   oxyCODONE 5 MG immediate release tablet Commonly known as: Roxicodone Take 1 tablet (5 mg total) by mouth every 6 (six) hours as needed. Replaces: oxyCODONE HCl 10 MG/0.5ML Conc   pantoprazole 40 MG tablet Commonly known as: PROTONIX Take 1 tablet (40 mg total) by mouth 2 (two) times daily before a meal.   polyethylene glycol 17 g packet Commonly known as: MIRALAX / GLYCOLAX Take 17 g by mouth daily.   prochlorperazine 10 MG tablet Commonly known as: COMPAZINE Take 10 mg  by mouth every 6 (six) hours as needed for nausea or vomiting.   promethazine 25 MG/ML injection Commonly known as: PHENERGAN Inject 25 mg into the muscle every 4 (four) hours as needed for nausea or vomiting.   promethazine 25 MG suppository Commonly known as: PHENERGAN Place 25 mg rectally every 4 (four) hours as needed for nausea.   senna 8.6 MG Tabs tablet Commonly known as: SENOKOT Take 2 tablets by mouth in the morning and at bedtime.   tiZANidine 4 MG tablet Commonly known as: ZANAFLEX Take 4 mg by mouth in the morning, at noon, and at bedtime. (0900, 1400 & 2100)   venlafaxine XR 75 MG 24 hr capsule Commonly known as: EFFEXOR-XR Take 75 mg by mouth daily. (0900)        Follow-up Information     Autumn Messing III, MD Follow up in 2 week(s).   Specialty: General Surgery Contact information: [place] [id number] [phone]                 Signed: Joyice Faster CornettMD  10/28/2021, 8:52 AM

## 2021-10-28 NOTE — Progress Notes (Signed)
Report given to [place] at [facility].  PTAR picking up for transportation. JP drain to left chest for transport. Dressing is dry and intact.  Dressing changed this morning.  Drained 70cc at discharge.

## 2021-10-28 NOTE — NC FL2 (Signed)
[place] LEVEL OF CARE SCREENING TOOL     IDENTIFICATION  Patient Name: Brittney Carroll Birthdate: 1946/10/20 Sex: female Admission Date (Current Location): 10/27/2021  [facility] and [place] Number:  Herbalist and Address:  [facility]. [facility], [place] [address], [place], [place] [address]      Provider Number: [id number]  Attending Physician Name and Address:  Jovita Kussmaul, MD  Relative Name and Phone Number:  Blanchard Mane Daughter     [phone]    Current Level of Care: Hospital Recommended Level of Care: [place] Prior Approval Number:    Date Approved/Denied:   PASRR Number:    Discharge Plan: SNF    Current Diagnoses: Patient Active Problem List   Diagnosis Date Noted   Cancer of left female breast ([place]) 10/27/2021   Breast cancer of upper-outer quadrant of left female breast ([place]) 02/24/2021   Pulmonary embolism ([place]) 01/23/2021   Breast mass in female    Symptomatic anemia    Goals of care, counseling/discussion    Palliative care by specialist    GI bleed 12/09/2020    Orientation RESPIRATION BLADDER Height & Weight     Self, Time, Situation, Place  Normal Incontinent Weight: 134 lb 7.7 oz (61 kg) Height:  5\' 8"  (172.7 cm)  BEHAVIORAL SYMPTOMS/MOOD NEUROLOGICAL BOWEL NUTRITION STATUS      Incontinent Diet (See DC Summary)  AMBULATORY STATUS COMMUNICATION OF NEEDS Skin   Limited Assist Verbally Normal                       Personal Care Assistance Level of Assistance  Bathing, Feeding, Dressing Bathing Assistance: Limited assistance Feeding assistance: Independent Dressing Assistance: Limited assistance     Functional Limitations Info  Sight, Hearing, Speech Sight Info: Adequate Hearing Info: Adequate Speech Info: Adequate    SPECIAL CARE FACTORS FREQUENCY                       Contractures Contractures Info: Not present    Additional Factors Info  Code Status,  Allergies Code Status Info: Full Code Allergies Info: Atenolol           Current Medications (10/28/2021):  This is the current hospital active medication list Current Facility-Administered Medications  Medication Dose Route Frequency Provider Last Rate Last Admin   0.9 %  sodium chloride infusion   Intravenous Continuous Autumn Messing III, MD       amLODipine (NORVASC) tablet 5 mg  5 mg Oral Daily Autumn Messing III, MD   5 mg at 10/28/21 0915   anastrozole (ARIMIDEX) tablet 1 mg  1 mg Oral Daily Autumn Messing III, MD       apixaban [facility]) tablet 5 mg  5 mg Oral BID Autumn Messing III, MD   5 mg at 10/28/21 0915   LORazepam (ATIVAN) tablet 0.5 mg  0.5 mg Oral Q3H PRN Autumn Messing III, MD       methocarbamol (ROBAXIN) tablet 500 mg  500 mg Oral Q6H PRN Autumn Messing III, MD       morphine 2 MG/ML injection 1-2 mg  1-2 mg Intravenous Q1H PRN Autumn Messing III, MD   2 mg at 10/28/21 0531   ondansetron (ZOFRAN-ODT) disintegrating tablet 4 mg  4 mg Oral Q6H PRN Autumn Messing III, MD       Or   ondansetron [facility]) injection 4 mg  4 mg Intravenous Q6H PRN  Autumn Messing III, MD       oxyCODONE (Oxy IR/ROXICODONE) immediate release tablet 5-10 mg  5-10 mg Oral Q4H PRN Autumn Messing III, MD   10 mg at 10/28/21 0915   pantoprazole (PROTONIX) EC tablet 40 mg  40 mg Oral BID AC Autumn Messing III, MD   40 mg at 10/28/21 0915   venlafaxine XR (EFFEXOR-XR) 24 hr capsule 75 mg  75 mg Oral Daily Autumn Messing III, MD   75 mg at 10/28/21 0915     Discharge Medications: Please see discharge summary for a list of discharge medications.  Relevant Imaging Results:  Relevant Lab Results:   Additional Information SSN: [id number]  Reece Agar, [place]

## 2021-10-28 NOTE — TOC Transition Note (Signed)
Transition of Care [facility]) - CM/SW Discharge Note   Patient Details  Name: Brittney Carroll MRN: [id number] Date of Birth: 12/10/1946  Transition of Care [facility]) CM/SW Contact:  Gabrielle Dare Phone Number: 10/28/2021, 10:35 AM   Clinical Narrative:    Patient will Discharge To: [place] Date:10/28/21 Family Notified:yes, daughter Blanchard Mane [phone] Transport NO:IBBC   Per MD patient ready for DC to [facility] . RN, patient, patient's family, and facility notified of DC. Assessment, Fl2/Pasrr, and Discharge Summary sent to facility. RN given number for report ([phone], Bed A30 Bed 1). DC packet on chart. Ambulance transport requested for patient.   CSW signing off.  Reed Breech LCSWA [phone]     Final next level of care: Long Term Nursing Home Barriers to Discharge: No Barriers Identified   Patient Goals and CMS Choice Patient states their goals for this hospitalization and ongoing recovery are:: " will live there for the rest of her life" CMS Medicare.gov Compare Post Acute Care list provided to:: Other (Comment Required) (Returning to [place] term) Choice offered to / list presented to : NA  Discharge Placement              Patient chooses bed at:  [facility]) Patient to be transferred to facility by: [place] Name of family member notified: Blanchard Mane Patient and family notified of of transfer: 10/28/21  Discharge Plan and Services In-house Referral: Clinical Social Work                                   Social Determinants of Health ([place]) Interventions     Readmission Risk Interventions Readmission Risk Prevention Plan 12/12/2020  Post Dischage Appt Complete  Medication Screening Complete  Transportation Screening Complete  Some recent data might be hidden

## 2021-10-30 LAB — SURGICAL PATHOLOGY

## 2021-10-30 NOTE — Anesthesia Postprocedure Evaluation (Signed)
Anesthesia Post Note  Patient: Brittney Carroll  Procedure(s) Performed: LEFT MODIFIED RADICAL MASTECTOMY (Left: Breast)     Patient location during evaluation: PACU Anesthesia Type: General Level of consciousness: awake and alert Pain management: pain level controlled Vital Signs Assessment: post-procedure vital signs reviewed and stable Respiratory status: spontaneous breathing, nonlabored ventilation, respiratory function stable and patient connected to nasal cannula oxygen Cardiovascular status: blood pressure returned to baseline and stable Postop Assessment: no apparent nausea or vomiting Anesthetic complications: no   No notable events documented.  Last Vitals:  Vitals:   10/28/21 0449 10/28/21 0846  BP: (!) 154/75 (!) 155/87  Pulse: 95 (!) 101  Resp: 18 18  Temp: 37.2 C 37.2 C  SpO2: 100% 99%    Last Pain:  Vitals:   10/28/21 1000  TempSrc:   PainSc: 5                  Barnet Glasgow

## 2021-11-01 DIAGNOSIS — F4321 Adjustment disorder with depressed mood: Secondary | ICD-10-CM | POA: Diagnosis not present

## 2021-11-02 DIAGNOSIS — K5900 Constipation, unspecified: Secondary | ICD-10-CM | POA: Diagnosis not present

## 2021-11-02 DIAGNOSIS — F419 Anxiety disorder, unspecified: Secondary | ICD-10-CM | POA: Diagnosis not present

## 2021-11-02 DIAGNOSIS — Z170 Estrogen receptor positive status [ER+]: Secondary | ICD-10-CM | POA: Diagnosis not present

## 2021-11-02 DIAGNOSIS — Z9012 Acquired absence of left breast and nipple: Secondary | ICD-10-CM | POA: Diagnosis not present

## 2021-11-02 DIAGNOSIS — M109 Gout, unspecified: Secondary | ICD-10-CM | POA: Diagnosis not present

## 2021-11-02 DIAGNOSIS — Z79899 Other long term (current) drug therapy: Secondary | ICD-10-CM | POA: Diagnosis not present

## 2021-11-02 DIAGNOSIS — C50512 Malignant neoplasm of lower-outer quadrant of left female breast: Secondary | ICD-10-CM | POA: Diagnosis not present

## 2021-11-02 DIAGNOSIS — K219 Gastro-esophageal reflux disease without esophagitis: Secondary | ICD-10-CM | POA: Diagnosis not present

## 2021-11-06 DIAGNOSIS — Z79899 Other long term (current) drug therapy: Secondary | ICD-10-CM | POA: Diagnosis not present

## 2021-11-06 DIAGNOSIS — I10 Essential (primary) hypertension: Secondary | ICD-10-CM | POA: Diagnosis not present

## 2021-11-06 DIAGNOSIS — K5731 Diverticulosis of large intestine without perforation or abscess with bleeding: Secondary | ICD-10-CM | POA: Diagnosis not present

## 2021-11-06 DIAGNOSIS — M25559 Pain in unspecified hip: Secondary | ICD-10-CM | POA: Diagnosis not present

## 2021-11-06 DIAGNOSIS — Z9012 Acquired absence of left breast and nipple: Secondary | ICD-10-CM | POA: Diagnosis not present

## 2021-11-06 DIAGNOSIS — C50512 Malignant neoplasm of lower-outer quadrant of left female breast: Secondary | ICD-10-CM | POA: Diagnosis not present

## 2021-11-06 DIAGNOSIS — K922 Gastrointestinal hemorrhage, unspecified: Secondary | ICD-10-CM | POA: Diagnosis not present

## 2021-11-06 DIAGNOSIS — G893 Neoplasm related pain (acute) (chronic): Secondary | ICD-10-CM | POA: Diagnosis not present

## 2021-11-07 DIAGNOSIS — F330 Major depressive disorder, recurrent, mild: Secondary | ICD-10-CM | POA: Diagnosis not present

## 2021-11-07 DIAGNOSIS — F411 Generalized anxiety disorder: Secondary | ICD-10-CM | POA: Diagnosis not present

## 2021-11-15 DIAGNOSIS — F4321 Adjustment disorder with depressed mood: Secondary | ICD-10-CM | POA: Diagnosis not present

## 2021-11-20 DIAGNOSIS — Z170 Estrogen receptor positive status [ER+]: Secondary | ICD-10-CM | POA: Diagnosis not present

## 2021-11-20 DIAGNOSIS — Z515 Encounter for palliative care: Secondary | ICD-10-CM | POA: Diagnosis not present

## 2021-11-20 DIAGNOSIS — C50412 Malignant neoplasm of upper-outer quadrant of left female breast: Secondary | ICD-10-CM | POA: Diagnosis not present

## 2021-11-23 DIAGNOSIS — F4321 Adjustment disorder with depressed mood: Secondary | ICD-10-CM | POA: Diagnosis not present

## 2021-11-30 DIAGNOSIS — R52 Pain, unspecified: Secondary | ICD-10-CM | POA: Diagnosis not present

## 2021-11-30 DIAGNOSIS — C50512 Malignant neoplasm of lower-outer quadrant of left female breast: Secondary | ICD-10-CM | POA: Diagnosis not present

## 2021-11-30 DIAGNOSIS — F419 Anxiety disorder, unspecified: Secondary | ICD-10-CM | POA: Diagnosis not present

## 2021-11-30 DIAGNOSIS — M25559 Pain in unspecified hip: Secondary | ICD-10-CM | POA: Diagnosis not present

## 2021-11-30 DIAGNOSIS — Z170 Estrogen receptor positive status [ER+]: Secondary | ICD-10-CM | POA: Diagnosis not present

## 2021-11-30 DIAGNOSIS — Z79899 Other long term (current) drug therapy: Secondary | ICD-10-CM | POA: Diagnosis not present

## 2021-11-30 DIAGNOSIS — G893 Neoplasm related pain (acute) (chronic): Secondary | ICD-10-CM | POA: Diagnosis not present

## 2021-11-30 DIAGNOSIS — K5900 Constipation, unspecified: Secondary | ICD-10-CM | POA: Diagnosis not present

## 2021-12-04 DIAGNOSIS — C50512 Malignant neoplasm of lower-outer quadrant of left female breast: Secondary | ICD-10-CM | POA: Diagnosis not present

## 2021-12-04 DIAGNOSIS — D649 Anemia, unspecified: Secondary | ICD-10-CM | POA: Diagnosis not present

## 2021-12-04 DIAGNOSIS — M25559 Pain in unspecified hip: Secondary | ICD-10-CM | POA: Diagnosis not present

## 2021-12-04 DIAGNOSIS — F419 Anxiety disorder, unspecified: Secondary | ICD-10-CM | POA: Diagnosis not present

## 2021-12-04 DIAGNOSIS — I10 Essential (primary) hypertension: Secondary | ICD-10-CM | POA: Diagnosis not present

## 2021-12-04 DIAGNOSIS — Z170 Estrogen receptor positive status [ER+]: Secondary | ICD-10-CM | POA: Diagnosis not present

## 2021-12-04 DIAGNOSIS — Z79899 Other long term (current) drug therapy: Secondary | ICD-10-CM | POA: Diagnosis not present

## 2021-12-04 DIAGNOSIS — G893 Neoplasm related pain (acute) (chronic): Secondary | ICD-10-CM | POA: Diagnosis not present

## 2021-12-07 DIAGNOSIS — F4321 Adjustment disorder with depressed mood: Secondary | ICD-10-CM | POA: Diagnosis not present

## 2021-12-08 DIAGNOSIS — B9689 Other specified bacterial agents as the cause of diseases classified elsewhere: Secondary | ICD-10-CM | POA: Diagnosis not present

## 2021-12-08 DIAGNOSIS — L089 Local infection of the skin and subcutaneous tissue, unspecified: Secondary | ICD-10-CM | POA: Diagnosis not present

## 2021-12-14 DIAGNOSIS — F4321 Adjustment disorder with depressed mood: Secondary | ICD-10-CM | POA: Diagnosis not present

## 2021-12-19 DIAGNOSIS — K219 Gastro-esophageal reflux disease without esophagitis: Secondary | ICD-10-CM | POA: Diagnosis not present

## 2021-12-19 DIAGNOSIS — G893 Neoplasm related pain (acute) (chronic): Secondary | ICD-10-CM | POA: Diagnosis not present

## 2021-12-19 DIAGNOSIS — F419 Anxiety disorder, unspecified: Secondary | ICD-10-CM | POA: Diagnosis not present

## 2021-12-19 DIAGNOSIS — R52 Pain, unspecified: Secondary | ICD-10-CM | POA: Diagnosis not present

## 2021-12-19 DIAGNOSIS — I10 Essential (primary) hypertension: Secondary | ICD-10-CM | POA: Diagnosis not present

## 2021-12-19 DIAGNOSIS — C50919 Malignant neoplasm of unspecified site of unspecified female breast: Secondary | ICD-10-CM | POA: Diagnosis not present

## 2021-12-19 DIAGNOSIS — Z79899 Other long term (current) drug therapy: Secondary | ICD-10-CM | POA: Diagnosis not present

## 2021-12-20 DIAGNOSIS — F411 Generalized anxiety disorder: Secondary | ICD-10-CM | POA: Diagnosis not present

## 2021-12-20 DIAGNOSIS — F330 Major depressive disorder, recurrent, mild: Secondary | ICD-10-CM | POA: Diagnosis not present

## 2021-12-22 DIAGNOSIS — F4321 Adjustment disorder with depressed mood: Secondary | ICD-10-CM | POA: Diagnosis not present

## 2021-12-28 DIAGNOSIS — Z9012 Acquired absence of left breast and nipple: Secondary | ICD-10-CM | POA: Diagnosis not present

## 2021-12-28 DIAGNOSIS — C50919 Malignant neoplasm of unspecified site of unspecified female breast: Secondary | ICD-10-CM | POA: Diagnosis not present

## 2021-12-28 DIAGNOSIS — I10 Essential (primary) hypertension: Secondary | ICD-10-CM | POA: Diagnosis not present

## 2021-12-28 DIAGNOSIS — Z79899 Other long term (current) drug therapy: Secondary | ICD-10-CM | POA: Diagnosis not present

## 2021-12-28 DIAGNOSIS — F419 Anxiety disorder, unspecified: Secondary | ICD-10-CM | POA: Diagnosis not present

## 2021-12-28 DIAGNOSIS — R52 Pain, unspecified: Secondary | ICD-10-CM | POA: Diagnosis not present

## 2021-12-28 DIAGNOSIS — G893 Neoplasm related pain (acute) (chronic): Secondary | ICD-10-CM | POA: Diagnosis not present

## 2021-12-28 DIAGNOSIS — Z170 Estrogen receptor positive status [ER+]: Secondary | ICD-10-CM | POA: Diagnosis not present

## 2021-12-28 DIAGNOSIS — F4321 Adjustment disorder with depressed mood: Secondary | ICD-10-CM | POA: Diagnosis not present

## 2022-01-01 DIAGNOSIS — Z170 Estrogen receptor positive status [ER+]: Secondary | ICD-10-CM | POA: Diagnosis not present

## 2022-01-01 DIAGNOSIS — Z515 Encounter for palliative care: Secondary | ICD-10-CM | POA: Diagnosis not present

## 2022-01-01 DIAGNOSIS — C50412 Malignant neoplasm of upper-outer quadrant of left female breast: Secondary | ICD-10-CM | POA: Diagnosis not present

## 2022-01-02 ENCOUNTER — Other Ambulatory Visit: Payer: Self-pay

## 2022-01-02 ENCOUNTER — Inpatient Hospital Stay (HOSPITAL_COMMUNITY): Payer: PPO | Attending: Hematology | Admitting: Hematology

## 2022-01-02 VITALS — BP 157/87 | HR 105 | Temp 96.9°F | Resp 17 | Wt 137.8 lb

## 2022-01-02 DIAGNOSIS — Z79811 Long term (current) use of aromatase inhibitors: Secondary | ICD-10-CM | POA: Insufficient documentation

## 2022-01-02 DIAGNOSIS — K5730 Diverticulosis of large intestine without perforation or abscess without bleeding: Secondary | ICD-10-CM | POA: Insufficient documentation

## 2022-01-02 DIAGNOSIS — Z1231 Encounter for screening mammogram for malignant neoplasm of breast: Secondary | ICD-10-CM

## 2022-01-02 DIAGNOSIS — Z803 Family history of malignant neoplasm of breast: Secondary | ICD-10-CM | POA: Diagnosis not present

## 2022-01-02 DIAGNOSIS — Z170 Estrogen receptor positive status [ER+]: Secondary | ICD-10-CM | POA: Insufficient documentation

## 2022-01-02 DIAGNOSIS — C50412 Malignant neoplasm of upper-outer quadrant of left female breast: Secondary | ICD-10-CM | POA: Insufficient documentation

## 2022-01-02 DIAGNOSIS — K5900 Constipation, unspecified: Secondary | ICD-10-CM | POA: Diagnosis not present

## 2022-01-02 DIAGNOSIS — Z79899 Other long term (current) drug therapy: Secondary | ICD-10-CM | POA: Diagnosis not present

## 2022-01-02 DIAGNOSIS — K269 Duodenal ulcer, unspecified as acute or chronic, without hemorrhage or perforation: Secondary | ICD-10-CM | POA: Diagnosis not present

## 2022-01-02 DIAGNOSIS — Z8719 Personal history of other diseases of the digestive system: Secondary | ICD-10-CM | POA: Diagnosis not present

## 2022-01-02 DIAGNOSIS — K2950 Unspecified chronic gastritis without bleeding: Secondary | ICD-10-CM | POA: Insufficient documentation

## 2022-01-02 DIAGNOSIS — I2699 Other pulmonary embolism without acute cor pulmonale: Secondary | ICD-10-CM | POA: Insufficient documentation

## 2022-01-02 DIAGNOSIS — R102 Pelvic and perineal pain: Secondary | ICD-10-CM | POA: Diagnosis not present

## 2022-01-02 DIAGNOSIS — M2550 Pain in unspecified joint: Secondary | ICD-10-CM | POA: Diagnosis not present

## 2022-01-02 DIAGNOSIS — K319 Disease of stomach and duodenum, unspecified: Secondary | ICD-10-CM | POA: Diagnosis not present

## 2022-01-02 DIAGNOSIS — Z7901 Long term (current) use of anticoagulants: Secondary | ICD-10-CM | POA: Diagnosis not present

## 2022-01-02 DIAGNOSIS — G479 Sleep disorder, unspecified: Secondary | ICD-10-CM | POA: Diagnosis not present

## 2022-01-02 NOTE — Patient Instructions (Addendum)
[place] at [facility] Discharge Instructions   You were seen and examined today by Dr. Delton Coombes.  Continue anastrozole daily as prescribed.  You will need to be on this medication for a total of 5 years.  Mammogram next month as scheduled.   Return as scheduled for lab work and office visit.    Thank you for choosing [place] at [facility] to provide your oncology and hematology care.  To afford each patient quality time with our provider, please arrive at least 15 minutes before your scheduled appointment time.   If you have a lab appointment with the [facility] please come in thru the Main Entrance and check in at the main information desk.  You need to re-schedule your appointment should you arrive 10 or more minutes late.  We strive to give you quality time with our providers, and arriving late affects you and other patients whose appointments are after yours.  Also, if you no show three or more times for appointments you may be dismissed from the clinic at the providers discretion.     Again, thank you for choosing [facility].  Our hope is that these requests will decrease the amount of time that you wait before being seen by our physicians.       _____________________________________________________________  Should you have questions after your visit to [facility], please contact our office at [phone] and follow the prompts.  Our office hours are 8:00 a.m. and 4:30 p.m. Monday - Friday.  Please note that voicemails left after 4:00 p.m. may not be returned until the following business day.  We are closed weekends and major holidays.  You do have access to a nurse 24-7, just call the main number to the clinic [phone] and do not press any options, hold on the line and a nurse will answer the phone.    For prescription refill requests, have your pharmacy contact our office and allow 72 hours.     Due to Covid, you will need to wear a mask upon entering the hospital. If you do not have a mask, a mask will be given to you at the Main Entrance upon arrival. For doctor visits, patients may have 1 support person age 63 or older with them. For treatment visits, patients can not have anyone with them due to social distancing guidelines and our immunocompromised population.

## 2022-01-02 NOTE — Progress Notes (Signed)
[facility] [address], [place] [address]   Patient Care Team: Celene Squibb, MD as PCP - General (Internal Medicine)  SUMMARY OF ONCOLOGIC HISTORY: Oncology History  Breast cancer of upper-outer quadrant of left female breast ([place])  02/24/2021 Initial Diagnosis   Breast cancer of upper-outer quadrant of left female breast ([place])   02/24/2021 Cancer Staging   Staging form: Breast, AJCC 8th Edition - Clinical stage from 02/24/2021: Stage IIIA (cT3, cN1(f), cM0, G3, ER+, PR+, HER2-) - Signed by Derek Jack, MD on 02/24/2021 Histopathologic type: Infiltrating duct carcinoma, NOS Stage prefix: Initial diagnosis Method of lymph node assessment: Core biopsy Histologic grading system: 3 grade system      CHIEF COMPLIANT: Follow-up for left breast cancer   INTERVAL HISTORY: [name] is a 76 y.o. female here today for follow up of her left breast cancer. Her last visit was on 05/25/2021.   Today she reports feeling good. She reports her left breast mass had decreased in size.   REVIEW OF SYSTEMS:   Review of Systems  Constitutional:  Negative for appetite change and fatigue.  Gastrointestinal:  Positive for constipation.  Genitourinary:  Positive for pelvic pain (6/10 groin).   Musculoskeletal:  Positive for arthralgias (6/10 legs).  Psychiatric/Behavioral:  Positive for sleep disturbance. The patient is nervous/anxious.   All other systems reviewed and are negative.  I have reviewed the past medical history, past surgical history, social history and family history with the patient and they are unchanged from previous note.   ALLERGIES:   is allergic to atenolol.   MEDICATIONS:  Current Outpatient Medications  Medication Sig Dispense Refill   amLODipine (NORVASC) 5 MG tablet Take 1 tablet (5 mg total) by mouth daily.     anastrozole (ARIMIDEX) 1 MG tablet Take 1 tablet (1 mg total) by mouth daily. 30 tablet 3   cholecalciferol (VITAMIN  D3) 25 MCG (1000 UNIT) tablet Take 1 tablet (1,000 Units total) by mouth daily. 90 tablet 3   docusate sodium (COLACE) 100 MG capsule Take 100 mg by mouth 2 (two) times daily. (0900 & 2100)     ELIQUIS 5 MG TABS tablet Take 5 mg by mouth 2 (two) times daily. (0900 & 2100)     ferrous sulfate 325 (65 FE) MG tablet Take 1 tablet (325 mg total) by mouth daily with breakfast.  3   hydrocortisone (ANUSOL-HC) 2.5 % rectal cream Place 1 application rectally 2 (two) times daily as needed for hemorrhoids or anal itching (rectal pain). 30 g 0   LORazepam (ATIVAN) 0.5 MG tablet Take 0.5 mg by mouth every 3 (three) hours as needed (agitation/anxiety).     methocarbamol (ROBAXIN) 500 MG tablet Take 500 mg by mouth in the morning, at noon, in the evening, and at bedtime. (0900, 1300, 1700 & 2100)     methocarbamol (ROBAXIN) 500 MG tablet Take 1 tablet (500 mg total) by mouth every 6 (six) hours as needed for muscle spasms. 30 tablet 2   Nutritional Supplements (NUTRITIONAL SUPPLEMENT PO) Take 1 Dose by mouth with breakfast, with lunch, and with evening meal. Health Shake (0800, 1200 & 1700)     olmesartan (BENICAR) 40 MG tablet Take 40 mg by mouth daily.     omeprazole (PRILOSEC) 20 MG capsule Take 20 mg by mouth 2 (two) times daily. (0800 & 2000)     ondansetron (ZOFRAN) 8 MG tablet Take 8 mg by mouth every 4 (four) hours as needed  for vomiting or nausea.     oxyCODONE (ROXICODONE) 5 MG immediate release tablet Take 1 tablet (5 mg total) by mouth every 6 (six) hours as needed. 15 tablet 0   pantoprazole (PROTONIX) 40 MG tablet Take 1 tablet (40 mg total) by mouth 2 (two) times daily before a meal.     polyethylene glycol (MIRALAX / GLYCOLAX) 17 g packet Take 17 g by mouth daily. 14 each 0   prochlorperazine (COMPAZINE) 10 MG tablet Take 10 mg by mouth every 6 (six) hours as needed for nausea or vomiting.     promethazine (PHENERGAN) 25 MG suppository Place 25 mg rectally every 4 (four) hours as needed for nausea.      promethazine (PHENERGAN) 25 MG/ML injection Inject 25 mg into the muscle every 4 (four) hours as needed for nausea or vomiting.     senna (SENOKOT) 8.6 MG TABS tablet Take 2 tablets by mouth in the morning and at bedtime.     tiZANidine (ZANAFLEX) 4 MG tablet Take 4 mg by mouth in the morning, at noon, and at bedtime. (0900, 1400 & 2100)     venlafaxine XR (EFFEXOR-XR) 75 MG 24 hr capsule Take 75 mg by mouth daily. (0900)     No current facility-administered medications for this visit.     PHYSICAL EXAMINATION: Performance status (ECOG): 2 - Symptomatic, <50% confined to bed  There were no vitals filed for this visit. Wt Readings from Last 3 Encounters:  10/27/21 134 lb 7.7 oz (61 kg)  09/28/21 139 lb (63 kg)  09/10/21 138 lb 0.1 oz (62.6 kg)   Physical Exam Vitals reviewed.  Constitutional:      Appearance: Normal appearance.     Comments: In wheelchair  Cardiovascular:     Rate and Rhythm: Normal rate and regular rhythm.     Pulses: Normal pulses.     Heart sounds: Normal heart sounds.  Pulmonary:     Effort: Pulmonary effort is normal.     Breath sounds: Normal breath sounds.  Chest:  Breasts:    Right: Normal. No swelling, bleeding, inverted nipple, mass, nipple discharge, skin change or tenderness.     Left: Absent. No swelling, bleeding, mass, skin change (mastectomy site within normal limits) or tenderness.  Musculoskeletal:     Right lower leg: No edema.     Left lower leg: No edema.  Lymphadenopathy:     Upper Body:     Right upper body: No supraclavicular, axillary or pectoral adenopathy.     Left upper body: No supraclavicular, axillary or pectoral adenopathy.  Neurological:     General: No focal deficit present.     Mental Status: She is alert and oriented to person, place, and time.  Psychiatric:        Mood and Affect: Mood normal.        Behavior: Behavior normal.    Breast Exam Chaperone: Thana Ates     LABORATORY DATA:  I have reviewed the  data as listed CMP Latest Ref Rng & Units 10/27/2021 09/08/2021 01/17/2021  Glucose 70 - 99 mg/dL 128(H) 286(H) 145(H)  BUN 8 - 23 mg/dL _0 Creatinine 0.44 - 1.00 mg/dL 0.70 0.76 0.96  Sodium 135 - 145 mmol/L 139 137 136  Potassium 3.5 - 5.1 mmol/L 3.9 3.3(L) 4.3  Chloride 98 - 111 mmol/L 104 102 102  CO2 22 - 32 mmol/L _1 Calcium 8.9 - 10.3 mg/dL 9.4 9.3 9.5  Total Protein 6.5 -  8.1 g/dL - 7.3 -  Total Bilirubin 0.3 - 1.2 mg/dL - 0.6 -  Alkaline Phos 38 - 126 U/L - 98 -  AST 15 - 41 U/L - 19 -  ALT 0 - 44 U/L - 13 -   No results found for: OAC166 Lab Results  Component Value Date   WBC 11.3 (H) 10/27/2021   HGB 11.9 (L) 10/27/2021   HCT 39.5 10/27/2021   MCV 90.0 10/27/2021   PLT 286 10/27/2021   NEUTROABS 10.3 (H) 01/17/2021    ASSESSMENT:  1.  Left breast mass in upper outer quadrant: - Presentation to the ER on 12/09/2020 with bleeding per rectum. - CTAP with contrast on 12/09/2020 shows large mass in the left breast measuring 4.6 x 6.2 cm.  No other clear evidence of metastatic disease. - EGD with gastritis, nonbleeding duodenal ulcers with no stigmata of bleeding, duodenitis.  Stomach biopsy on 12/12/2020 shows mild chronic gastritis, reactive gastropathy. - Colonoscopy on 12/12/2020 with nonbleeding internal hemorrhoids, diverticulosis in the entire colon, blood in the transverse colon. -She reports having GYN related cancer, underwent TAH in 2008 by Dr. Fermin Schwab at [facility].  I do not have medical records to review. - Reports growing left breast mass over the last 1 year.  Denies any weight loss. - Left breast biopsy on 02/14/2021 with invasive ductal carcinoma, grade 3.  Left axillary lymph node biopsy consistent with metastatic carcinoma.  ER 70% positive, PR 80% positive, Ki-67 25%, HER2 negative by FISH. - Anastrozole was started in June 2022. - Left modified radical mastectomy and lymph node dissection on 10/27/2021. - Pathology shows 4 x 3 x 3  cm tumor, 1/35 lymph nodes positive, margins negative.  ER 70%, PR 80%, HER2 2+ by IHC and negative by FISH.  Ki-67 25%.  Treatment changes present.  PT2PN1A.   2.  Social/family history: - She lives by herself.  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 - Sister died of breast cancer in her 87s.   PLAN:  1.  T3N1 ER positive infiltrating ductal carcinoma of the left breast: - She underwent left MRM and axillary lymph node biopsy on 10/27/2021. - We discussed pathology report which showed some residual tumor with treatment benefit. - I have recommended continuing anastrozole at this time. - I will schedule her for right breast mammogram in February.  Left mastectomy site is well-healed.  Right breast has no palpable masses. - RTC 4 months for follow-up with repeat labs including tumor markers.   2.  Nausea: - Continue Compazine as needed.   3.  Generalized body pains: - These are stable.  No new pains.   4.  Pulmonary embolism: - Continue Eliquis twice daily.  No bleeding issues.  Breast Cancer therapy associated bone loss: I have recommended calcium, Vitamin D and weight bearing exercises.  Orders placed this encounter:  No orders of the defined types were placed in this encounter.   The patient has a good understanding of the overall plan. She agrees with it. She will call with any problems that may develop before the next visit here.  Derek Jack, MD [place] [phone]   I, Thana Ates, am acting as a scribe for Dr. Derek Jack.  I, Derek Jack MD, have reviewed the above documentation for accuracy and completeness, and I agree with the above.

## 2022-01-03 DIAGNOSIS — F4321 Adjustment disorder with depressed mood: Secondary | ICD-10-CM | POA: Diagnosis not present

## 2022-01-08 ENCOUNTER — Ambulatory Visit (HOSPITAL_COMMUNITY): Payer: PPO

## 2022-01-09 DIAGNOSIS — G893 Neoplasm related pain (acute) (chronic): Secondary | ICD-10-CM | POA: Diagnosis not present

## 2022-01-09 DIAGNOSIS — K219 Gastro-esophageal reflux disease without esophagitis: Secondary | ICD-10-CM | POA: Diagnosis not present

## 2022-01-09 DIAGNOSIS — K5900 Constipation, unspecified: Secondary | ICD-10-CM | POA: Diagnosis not present

## 2022-01-09 DIAGNOSIS — F419 Anxiety disorder, unspecified: Secondary | ICD-10-CM | POA: Diagnosis not present

## 2022-01-09 DIAGNOSIS — I10 Essential (primary) hypertension: Secondary | ICD-10-CM | POA: Diagnosis not present

## 2022-01-09 DIAGNOSIS — D649 Anemia, unspecified: Secondary | ICD-10-CM | POA: Diagnosis not present

## 2022-01-09 DIAGNOSIS — C50919 Malignant neoplasm of unspecified site of unspecified female breast: Secondary | ICD-10-CM | POA: Diagnosis not present

## 2022-01-09 DIAGNOSIS — Z9012 Acquired absence of left breast and nipple: Secondary | ICD-10-CM | POA: Diagnosis not present

## 2022-01-10 DIAGNOSIS — F4321 Adjustment disorder with depressed mood: Secondary | ICD-10-CM | POA: Diagnosis not present

## 2022-01-15 DIAGNOSIS — F419 Anxiety disorder, unspecified: Secondary | ICD-10-CM | POA: Diagnosis not present

## 2022-01-15 DIAGNOSIS — K219 Gastro-esophageal reflux disease without esophagitis: Secondary | ICD-10-CM | POA: Diagnosis not present

## 2022-01-15 DIAGNOSIS — K5900 Constipation, unspecified: Secondary | ICD-10-CM | POA: Diagnosis not present

## 2022-01-15 DIAGNOSIS — F32A Depression, unspecified: Secondary | ICD-10-CM | POA: Diagnosis not present

## 2022-01-15 DIAGNOSIS — R52 Pain, unspecified: Secondary | ICD-10-CM | POA: Diagnosis not present

## 2022-01-15 DIAGNOSIS — B379 Candidiasis, unspecified: Secondary | ICD-10-CM | POA: Diagnosis not present

## 2022-01-15 DIAGNOSIS — C50512 Malignant neoplasm of lower-outer quadrant of left female breast: Secondary | ICD-10-CM | POA: Diagnosis not present

## 2022-01-26 ENCOUNTER — Other Ambulatory Visit: Payer: Self-pay

## 2022-01-26 ENCOUNTER — Ambulatory Visit (HOSPITAL_COMMUNITY)
Admission: RE | Admit: 2022-01-26 | Discharge: 2022-01-26 | Disposition: A | Discharge status: Home / Self Care | Payer: PPO | Source: Ambulatory Visit | Attending: Hematology | Admitting: Hematology

## 2022-01-26 DIAGNOSIS — Z170 Estrogen receptor positive status [ER+]: Secondary | ICD-10-CM | POA: Insufficient documentation

## 2022-01-26 DIAGNOSIS — C50412 Malignant neoplasm of upper-outer quadrant of left female breast: Secondary | ICD-10-CM | POA: Insufficient documentation

## 2022-01-26 DIAGNOSIS — Z1231 Encounter for screening mammogram for malignant neoplasm of breast: Secondary | ICD-10-CM | POA: Diagnosis not present

## 2022-02-01 DIAGNOSIS — F32A Depression, unspecified: Secondary | ICD-10-CM | POA: Diagnosis not present

## 2022-02-01 DIAGNOSIS — M25559 Pain in unspecified hip: Secondary | ICD-10-CM | POA: Diagnosis not present

## 2022-02-01 DIAGNOSIS — F419 Anxiety disorder, unspecified: Secondary | ICD-10-CM | POA: Diagnosis not present

## 2022-02-01 DIAGNOSIS — C50919 Malignant neoplasm of unspecified site of unspecified female breast: Secondary | ICD-10-CM | POA: Diagnosis not present

## 2022-02-01 DIAGNOSIS — I2699 Other pulmonary embolism without acute cor pulmonale: Secondary | ICD-10-CM | POA: Diagnosis not present

## 2022-02-01 DIAGNOSIS — K219 Gastro-esophageal reflux disease without esophagitis: Secondary | ICD-10-CM | POA: Diagnosis not present

## 2022-02-01 DIAGNOSIS — K5900 Constipation, unspecified: Secondary | ICD-10-CM | POA: Diagnosis not present

## 2022-02-01 DIAGNOSIS — R52 Pain, unspecified: Secondary | ICD-10-CM | POA: Diagnosis not present

## 2022-02-06 DIAGNOSIS — F4321 Adjustment disorder with depressed mood: Secondary | ICD-10-CM | POA: Diagnosis not present

## 2022-02-21 DIAGNOSIS — F4321 Adjustment disorder with depressed mood: Secondary | ICD-10-CM | POA: Diagnosis not present

## 2022-02-26 DIAGNOSIS — C50512 Malignant neoplasm of lower-outer quadrant of left female breast: Secondary | ICD-10-CM | POA: Diagnosis not present

## 2022-02-26 DIAGNOSIS — M25551 Pain in right hip: Secondary | ICD-10-CM | POA: Diagnosis not present

## 2022-02-26 DIAGNOSIS — F419 Anxiety disorder, unspecified: Secondary | ICD-10-CM | POA: Diagnosis not present

## 2022-02-26 DIAGNOSIS — F32A Depression, unspecified: Secondary | ICD-10-CM | POA: Diagnosis not present

## 2022-02-26 DIAGNOSIS — R52 Pain, unspecified: Secondary | ICD-10-CM | POA: Diagnosis not present

## 2022-02-26 DIAGNOSIS — M25559 Pain in unspecified hip: Secondary | ICD-10-CM | POA: Diagnosis not present

## 2022-02-26 DIAGNOSIS — G893 Neoplasm related pain (acute) (chronic): Secondary | ICD-10-CM | POA: Diagnosis not present

## 2022-02-26 DIAGNOSIS — M25552 Pain in left hip: Secondary | ICD-10-CM | POA: Diagnosis not present

## 2022-02-26 DIAGNOSIS — D649 Anemia, unspecified: Secondary | ICD-10-CM | POA: Diagnosis not present

## 2022-02-28 DIAGNOSIS — F330 Major depressive disorder, recurrent, mild: Secondary | ICD-10-CM | POA: Diagnosis not present

## 2022-02-28 DIAGNOSIS — F411 Generalized anxiety disorder: Secondary | ICD-10-CM | POA: Diagnosis not present

## 2022-03-05 DIAGNOSIS — F32A Depression, unspecified: Secondary | ICD-10-CM | POA: Diagnosis not present

## 2022-03-05 DIAGNOSIS — C50919 Malignant neoplasm of unspecified site of unspecified female breast: Secondary | ICD-10-CM | POA: Diagnosis not present

## 2022-03-05 DIAGNOSIS — K5900 Constipation, unspecified: Secondary | ICD-10-CM | POA: Diagnosis not present

## 2022-03-05 DIAGNOSIS — G893 Neoplasm related pain (acute) (chronic): Secondary | ICD-10-CM | POA: Diagnosis not present

## 2022-03-05 DIAGNOSIS — K922 Gastrointestinal hemorrhage, unspecified: Secondary | ICD-10-CM | POA: Diagnosis not present

## 2022-03-05 DIAGNOSIS — F419 Anxiety disorder, unspecified: Secondary | ICD-10-CM | POA: Diagnosis not present

## 2022-03-05 DIAGNOSIS — K219 Gastro-esophageal reflux disease without esophagitis: Secondary | ICD-10-CM | POA: Diagnosis not present

## 2022-03-07 DIAGNOSIS — Z515 Encounter for palliative care: Secondary | ICD-10-CM | POA: Diagnosis not present

## 2022-03-07 DIAGNOSIS — Z170 Estrogen receptor positive status [ER+]: Secondary | ICD-10-CM | POA: Diagnosis not present

## 2022-03-07 DIAGNOSIS — C50412 Malignant neoplasm of upper-outer quadrant of left female breast: Secondary | ICD-10-CM | POA: Diagnosis not present

## 2022-03-07 DIAGNOSIS — F4321 Adjustment disorder with depressed mood: Secondary | ICD-10-CM | POA: Diagnosis not present

## 2022-03-14 DIAGNOSIS — I10 Essential (primary) hypertension: Secondary | ICD-10-CM | POA: Diagnosis not present

## 2022-03-19 DIAGNOSIS — Z170 Estrogen receptor positive status [ER+]: Secondary | ICD-10-CM | POA: Diagnosis not present

## 2022-03-19 DIAGNOSIS — K5900 Constipation, unspecified: Secondary | ICD-10-CM | POA: Diagnosis not present

## 2022-03-19 DIAGNOSIS — K219 Gastro-esophageal reflux disease without esophagitis: Secondary | ICD-10-CM | POA: Diagnosis not present

## 2022-03-19 DIAGNOSIS — R195 Other fecal abnormalities: Secondary | ICD-10-CM | POA: Diagnosis not present

## 2022-03-19 DIAGNOSIS — K5731 Diverticulosis of large intestine without perforation or abscess with bleeding: Secondary | ICD-10-CM | POA: Diagnosis not present

## 2022-03-19 DIAGNOSIS — D649 Anemia, unspecified: Secondary | ICD-10-CM | POA: Diagnosis not present

## 2022-03-22 DIAGNOSIS — F411 Generalized anxiety disorder: Secondary | ICD-10-CM | POA: Diagnosis not present

## 2022-03-28 DIAGNOSIS — M79675 Pain in left toe(s): Secondary | ICD-10-CM | POA: Diagnosis not present

## 2022-03-28 DIAGNOSIS — B351 Tinea unguium: Secondary | ICD-10-CM | POA: Diagnosis not present

## 2022-04-02 DIAGNOSIS — I10 Essential (primary) hypertension: Secondary | ICD-10-CM | POA: Diagnosis not present

## 2022-04-02 DIAGNOSIS — G893 Neoplasm related pain (acute) (chronic): Secondary | ICD-10-CM | POA: Diagnosis not present

## 2022-04-02 DIAGNOSIS — F419 Anxiety disorder, unspecified: Secondary | ICD-10-CM | POA: Diagnosis not present

## 2022-04-04 DIAGNOSIS — I10 Essential (primary) hypertension: Secondary | ICD-10-CM | POA: Diagnosis not present

## 2022-04-04 DIAGNOSIS — F32A Depression, unspecified: Secondary | ICD-10-CM | POA: Diagnosis not present

## 2022-04-04 DIAGNOSIS — M25559 Pain in unspecified hip: Secondary | ICD-10-CM | POA: Diagnosis not present

## 2022-04-04 DIAGNOSIS — F419 Anxiety disorder, unspecified: Secondary | ICD-10-CM | POA: Diagnosis not present

## 2022-04-04 DIAGNOSIS — D649 Anemia, unspecified: Secondary | ICD-10-CM | POA: Diagnosis not present

## 2022-04-04 DIAGNOSIS — K5900 Constipation, unspecified: Secondary | ICD-10-CM | POA: Diagnosis not present

## 2022-04-04 DIAGNOSIS — R52 Pain, unspecified: Secondary | ICD-10-CM | POA: Diagnosis not present

## 2022-04-04 DIAGNOSIS — K219 Gastro-esophageal reflux disease without esophagitis: Secondary | ICD-10-CM | POA: Diagnosis not present

## 2022-04-11 DIAGNOSIS — F411 Generalized anxiety disorder: Secondary | ICD-10-CM | POA: Diagnosis not present

## 2022-04-11 DIAGNOSIS — F330 Major depressive disorder, recurrent, mild: Secondary | ICD-10-CM | POA: Diagnosis not present

## 2022-04-13 DIAGNOSIS — I10 Essential (primary) hypertension: Secondary | ICD-10-CM | POA: Diagnosis not present

## 2022-04-13 DIAGNOSIS — F419 Anxiety disorder, unspecified: Secondary | ICD-10-CM | POA: Diagnosis not present

## 2022-04-18 DIAGNOSIS — Z170 Estrogen receptor positive status [ER+]: Secondary | ICD-10-CM | POA: Diagnosis not present

## 2022-04-18 DIAGNOSIS — C50412 Malignant neoplasm of upper-outer quadrant of left female breast: Secondary | ICD-10-CM | POA: Diagnosis not present

## 2022-04-18 DIAGNOSIS — Z515 Encounter for palliative care: Secondary | ICD-10-CM | POA: Diagnosis not present

## 2022-04-22 ENCOUNTER — Other Ambulatory Visit: Payer: Self-pay

## 2022-04-22 ENCOUNTER — Emergency Department (HOSPITAL_COMMUNITY): Payer: PPO

## 2022-04-22 ENCOUNTER — Encounter (HOSPITAL_COMMUNITY): Payer: Self-pay

## 2022-04-22 ENCOUNTER — Emergency Department (HOSPITAL_COMMUNITY)
Admission: EM | Admit: 2022-04-22 | Discharge: 2022-04-22 | Disposition: A | Discharge status: Skilled Nursing Facility | Payer: PPO | Attending: Emergency Medicine | Admitting: Emergency Medicine

## 2022-04-22 DIAGNOSIS — Y92129 Unspecified place in nursing home as the place of occurrence of the external cause: Secondary | ICD-10-CM | POA: Diagnosis not present

## 2022-04-22 DIAGNOSIS — Z859 Personal history of malignant neoplasm, unspecified: Secondary | ICD-10-CM | POA: Insufficient documentation

## 2022-04-22 DIAGNOSIS — R279 Unspecified lack of coordination: Secondary | ICD-10-CM | POA: Diagnosis not present

## 2022-04-22 DIAGNOSIS — R102 Pelvic and perineal pain: Secondary | ICD-10-CM | POA: Diagnosis not present

## 2022-04-22 DIAGNOSIS — S8991XA Unspecified injury of right lower leg, initial encounter: Secondary | ICD-10-CM | POA: Diagnosis not present

## 2022-04-22 DIAGNOSIS — W06XXXA Fall from bed, initial encounter: Secondary | ICD-10-CM | POA: Insufficient documentation

## 2022-04-22 DIAGNOSIS — Z7984 Long term (current) use of oral hypoglycemic drugs: Secondary | ICD-10-CM | POA: Diagnosis not present

## 2022-04-22 DIAGNOSIS — R52 Pain, unspecified: Secondary | ICD-10-CM | POA: Diagnosis not present

## 2022-04-22 DIAGNOSIS — Z743 Need for continuous supervision: Secondary | ICD-10-CM | POA: Diagnosis not present

## 2022-04-22 DIAGNOSIS — Z79899 Other long term (current) drug therapy: Secondary | ICD-10-CM | POA: Insufficient documentation

## 2022-04-22 DIAGNOSIS — M25562 Pain in left knee: Secondary | ICD-10-CM | POA: Insufficient documentation

## 2022-04-22 DIAGNOSIS — S82001A Unspecified fracture of right patella, initial encounter for closed fracture: Secondary | ICD-10-CM | POA: Diagnosis not present

## 2022-04-22 DIAGNOSIS — W19XXXA Unspecified fall, initial encounter: Secondary | ICD-10-CM | POA: Diagnosis not present

## 2022-04-22 DIAGNOSIS — M25561 Pain in right knee: Secondary | ICD-10-CM | POA: Diagnosis not present

## 2022-04-22 DIAGNOSIS — I739 Peripheral vascular disease, unspecified: Secondary | ICD-10-CM | POA: Diagnosis not present

## 2022-04-22 DIAGNOSIS — I10 Essential (primary) hypertension: Secondary | ICD-10-CM | POA: Diagnosis not present

## 2022-04-22 DIAGNOSIS — Z7901 Long term (current) use of anticoagulants: Secondary | ICD-10-CM | POA: Insufficient documentation

## 2022-04-22 DIAGNOSIS — T148XXA Other injury of unspecified body region, initial encounter: Secondary | ICD-10-CM

## 2022-04-22 DIAGNOSIS — M7989 Other specified soft tissue disorders: Secondary | ICD-10-CM | POA: Diagnosis not present

## 2022-04-22 DIAGNOSIS — M25461 Effusion, right knee: Secondary | ICD-10-CM | POA: Diagnosis not present

## 2022-04-22 DIAGNOSIS — S80919A Unspecified superficial injury of unspecified knee, initial encounter: Secondary | ICD-10-CM | POA: Diagnosis not present

## 2022-04-22 MED ORDER — FENTANYL CITRATE PF 50 MCG/ML IJ SOSY
50.0000 ug | PREFILLED_SYRINGE | Freq: Once | INTRAMUSCULAR | Status: AC
  Administered 2022-04-22: 50 ug via INTRAMUSCULAR
  Filled 2022-04-22: qty 1

## 2022-04-22 MED ORDER — MORPHINE SULFATE 15 MG PO TABS
15.0000 mg | ORAL_TABLET | ORAL | Status: DC | PRN
  Administered 2022-04-22: 15 mg via ORAL
  Filled 2022-04-22: qty 1

## 2022-04-22 NOTE — ED Triage Notes (Signed)
Patient via EMS from [facility] with complaints of knee pain after slipping out of bed and falling onto knees. Staff assisted her back to bed, did not fall completely into floor and did not hit head.  ?

## 2022-04-22 NOTE — Discharge Instructions (Addendum)
X-ray of Brittney Carroll indicates that she might have a small fracture of the right knee. ? ?Keep her knee in the immobilizer. ? ?Continue to manage the pain with the home dose morphine. ? ?Have her follow-up with the orthopedic surgery in 7 to 14 days -call the number provided to set up an appointment. ?

## 2022-04-22 NOTE — ED Notes (Signed)
Crackers given to pt ?

## 2022-04-22 NOTE — ED Notes (Signed)
Attempted to give report to [facility]. Secretary had me on hold for more than 10 minutes.  ?

## 2022-04-22 NOTE — ED Provider Notes (Signed)
?[facility] ?Provider Note ? ? ?CSN: [id number] ?Arrival date & time: 04/22/22  0716 ? ?  ? ?History ? ?Chief Complaint  ?Patient presents with  ? Knee Pain  ? ? ?Brittney Carroll is a 76 y.o. female. ? ?HPI ? ?  ?76 year old female with history of malignant cancer with metastases who has comfort care measures written in the MOST form comes in with chief complaint of knee pain.  Patient resides at [facility].  She allegedly stepped out of her bed and complained of pain to her knee.  Patient did not strike her head allegedly.  She is not having any headache. ? ?I spoke with the nursing staff at the facility.  They informed that patient had an unwitnessed fall.   ? ?Home Medications ?Prior to Admission medications   ?Medication Sig Start Date End Date Taking? Authorizing Provider  ?amLODipine (NORVASC) 5 MG tablet Take 1 tablet (5 mg total) by mouth daily. 12/15/20   Orson Eva, MD  ?anastrozole (ARIMIDEX) 1 MG tablet Take 1 tablet (1 mg total) by mouth daily. 05/25/21   Derek Jack, MD  ?cholecalciferol (VITAMIN D3) 25 MCG (1000 UNIT) tablet Take 1 tablet (1,000 Units total) by mouth daily. 05/25/21   Derek Jack, MD  ?colchicine 0.6 MG tablet Take 0.6 mg by mouth daily. 11/02/21   [provider]  ?docusate sodium (COLACE) 100 MG capsule Take 100 mg by mouth 2 (two) times daily. (0900 & 2100)    [provider]  ?ELIQUIS 5 MG TABS tablet Take 5 mg by mouth 2 (two) times daily. (0900 & 2100) 07/18/21   [provider]  ?ferrous sulfate 325 (65 FE) MG tablet Take 1 tablet (325 mg total) by mouth daily with breakfast. 12/15/20   Tat, Shanon Brow, MD  ?hydrocortisone (ANUSOL-HC) 2.5 % rectal cream Place 1 application rectally 2 (two) times daily as needed for hemorrhoids or anal itching (rectal pain). ?Patient not taking: Reported on 01/02/2022 10/28/21   Norm Parcel, PA-C  ?HYDROmorphone (DILAUDID) 2 MG tablet  02/05/21   [provider]   ?linezolid (ZYVOX) 600 MG tablet Take 600 mg by mouth 2 (two) times daily. 12/11/21   [provider]  ?LORazepam (ATIVAN) 0.5 MG tablet Take 0.5 mg by mouth every 3 (three) hours as needed (agitation/anxiety). ?Patient not taking: Reported on 01/02/2022 03/29/21   [provider]  ?metFORMIN (GLUCOPHAGE-XR) 500 MG 24 hr tablet  04/08/20   [provider]  ?methocarbamol (ROBAXIN) 500 MG tablet Take 500 mg by mouth in the morning, at noon, in the evening, and at bedtime. (0900, 1300, 1700 & 2100) 02/23/21   [provider]  ?methocarbamol (ROBAXIN) 500 MG tablet Take 1 tablet (500 mg total) by mouth every 6 (six) hours as needed for muscle spasms. ?Patient not taking: Reported on 01/02/2022 10/27/21   Autumn Messing III, MD  ?Nutritional Supplements (NUTRITIONAL SUPPLEMENT PO) Take 1 Dose by mouth with breakfast, with lunch, and with evening meal. Health Shake (0800, 1200 & 1700)    [provider]  ?olmesartan (BENICAR) 40 MG tablet Take 40 mg by mouth daily. 07/12/20   [provider]  ?omeprazole (PRILOSEC) 20 MG capsule Take 20 mg by mouth 2 (two) times daily. (0800 & 2000) 02/24/21   [provider]  ?ondansetron (ZOFRAN) 8 MG tablet Take 8 mg by mouth every 4 (four) hours as needed for vomiting or nausea. ?Patient not taking: Reported on 01/02/2022 03/14/21   [provider]  ?oxyCODONE (ROXICODONE) 5 MG immediate release tablet Take 1 tablet (5 mg total) by mouth every 6 (six) hours as needed. ?Patient not taking: Reported on 01/02/2022 10/28/21 10/28/22  Norm Parcel, PA-C  ?pantoprazole (PROTONIX) 40 MG tablet Take 1 tablet (40 mg total) by mouth 2 (two) times daily before a meal. 12/14/20   Tat, Shanon Brow, MD  ?polyethylene glycol (MIRALAX / GLYCOLAX) 17 g packet Take 17 g by mouth daily. 12/15/20   Orson Eva, MD  ?prochlorperazine (COMPAZINE) 10 MG tablet Take 10 mg by mouth every 6 (six) hours as needed for nausea or vomiting. 03/31/21    [provider]  ?promethazine (PHENERGAN) 25 MG/ML injection Inject 25 mg into the muscle every 4 (four) hours as needed for nausea or vomiting. ?Patient not taking: Reported on 01/02/2022    [provider]  ?senna (SENOKOT) 8.6 MG TABS tablet Take 2 tablets by mouth in the morning and at bedtime.    [provider]  ?tiZANidine (ZANAFLEX) 4 MG tablet Take 4 mg by mouth in the morning, at noon, and at bedtime. (0900, 1400 & 2100)    [provider]  ?venlafaxine XR (EFFEXOR-XR) 75 MG 24 hr capsule Take 75 mg by mouth daily. (0900) 02/24/21   [provider]  ?   ? ?Allergies    ?Atenolol   ? ?Review of Systems   ?Review of Systems ? ?Physical Exam ?Updated Vital Signs ?BP (!) 141/69   Pulse 100   Temp 97.7 ?F (36.5 ?C) (Oral)   Resp 15   Ht 5\' 8"  (1.727 m)   Wt 61.2 kg   SpO2 100%   BMI 20.53 kg/m?  ?Physical Exam ?Vitals and nursing note reviewed.  ?Constitutional:   ?   Appearance: She is well-developed.  ?HENT:  ?   Head: Atraumatic.  ?Cardiovascular:  ?   Rate and Rhythm: Normal rate.  ?Pulmonary:  ?   Effort: Pulmonary effort is normal.  ?Musculoskeletal:     ?   General: Tenderness present. No swelling.  ?   Cervical back: Normal range of motion and neck supple.  ?   Comments: Tenderness over bilateral knee. ?No evidence of trauma to the face, head.  Patient has no midline C-spine tenderness.  She has no abdominal tenderness or pelvis instability.  ?Skin: ?   General: Skin is warm and dry.  ?Neurological:  ?   Mental Status: She is alert. Mental status is at baseline.  ? ? ?ED Results / Procedures / Treatments   ?Labs ?(all labs ordered are listed, but only abnormal results are displayed) ?Labs Reviewed - No data to display ? ?EKG ?None ? ?Radiology ?DG Pelvis Portable ? ?Result Date: 04/22/2022 ?CLINICAL DATA:  76 year old female with pain after fall from bed. EXAM: PORTABLE PELVIS 1-2 VIEWS COMPARISON:  Pelvis radiograph 09/10/2021. CT Abdomen and Pelvis  09/09/2021. FINDINGS: AP pelvis at 0815 hours. Severe chronic deformity of the bilateral hips and femoral heads appears stable from the CT last year. No acute pelvis fracture identified. Similar stool ball in the rectum. Nonobstructed visible other bowel gas pattern. IMPRESSION: 1. No acute fracture or dislocation identified about the pelvis. 2. Severe chronic bilateral hip deformity. Electronically Signed   By: Genevie Ann M.D.   On: 04/22/2022 08:49  ? ?DG Knee Left Port ? ?Result Date: 04/22/2022 ?CLINICAL DATA:  76 year old female with pain after fall from bed. EXAM: PORTABLE LEFT KNEE - 1-2 VIEW COMPARISON:  Left femur series 09/10/2021. FINDINGS:  Osteopenia. Stable joint spaces and alignment from last year. No definite joint effusion. Patella appears intact. No acute osseous abnormality identified. Calcified peripheral vascular disease. IMPRESSION: Osteopenia. No acute fracture or dislocation identified about the left knee. Electronically Signed   By: Genevie Ann M.D.   On: 04/22/2022 08:50  ? ?DG Knee Right Port ? ?Result Date: 04/22/2022 ?CLINICAL DATA:  76 year old female with pain after fall from bed. EXAM: PORTABLE RIGHT KNEE - 1-2 VIEW COMPARISON:  Right femur series 09/10/2021. FINDINGS: Osteopenia. Moderate size hyperdense right knee joint effusion on the cross-table lateral view. Possible lipohemarthrosis. Patella appears to remain intact. Tibial plateau also appears grossly intact along with the proximal fibula. Questionable nondisplaced distal right femur metadiaphysis fracture laterally (arrow). Regional soft tissue swelling. Calcified peripheral vascular disease. IMPRESSION: 1. Knee joint effusion suspicious for lipohemarthrosis. 2. Osteopenia with suspected nondisplaced fracture, possibly at the lateral femoral metadiaphysis. Electronically Signed   By: Genevie Ann M.D.   On: 04/22/2022 08:52   ? ?Procedures ?Procedures  ? ? ?Medications Ordered in ED ?Medications  ?fentaNYL (SUBLIMAZE) injection 50 mcg (50 mcg  Intramuscular Given 04/22/22 0756)  ? ? ?ED Course/ Medical Decision Making/ A&P ?  ?                        ?Medical Decision Making ?Amount and/or Complexity of Data Reviewed ?Radiology: ordered. ? ?Risk ?Prescription d

## 2022-04-22 NOTE — ED Notes (Signed)
[facility] called to set up transportation at this time back to [facility]. ?

## 2022-04-23 ENCOUNTER — Telehealth: Payer: Self-pay | Admitting: Orthopedic Surgery

## 2022-04-23 DIAGNOSIS — R4182 Altered mental status, unspecified: Secondary | ICD-10-CM | POA: Diagnosis not present

## 2022-04-23 DIAGNOSIS — N390 Urinary tract infection, site not specified: Secondary | ICD-10-CM | POA: Diagnosis not present

## 2022-04-23 NOTE — Telephone Encounter (Signed)
Call received from [facility], [facility] facility, formerly [facility] [place], [place], requesting appointment following [facility] visit yesterday, 5./8/23, for knee injury; Xray is noted as: ?". Knee joint effusion suspicious for lipohemarthrosis. ?2. Osteopenia with suspected nondisplaced fracture, possibly at the ?lateral femoral metadiaphysis." ? Please advise regarding scheduling based on current schedules ?

## 2022-04-27 DIAGNOSIS — R410 Disorientation, unspecified: Secondary | ICD-10-CM | POA: Diagnosis not present

## 2022-04-27 DIAGNOSIS — S82009A Unspecified fracture of unspecified patella, initial encounter for closed fracture: Secondary | ICD-10-CM | POA: Diagnosis not present

## 2022-04-27 DIAGNOSIS — N390 Urinary tract infection, site not specified: Secondary | ICD-10-CM | POA: Diagnosis not present

## 2022-04-27 NOTE — Telephone Encounter (Signed)
Done / appointment scheduled / facility aware. ?

## 2022-04-30 DIAGNOSIS — H2513 Age-related nuclear cataract, bilateral: Secondary | ICD-10-CM | POA: Diagnosis not present

## 2022-04-30 DIAGNOSIS — H5213 Myopia, bilateral: Secondary | ICD-10-CM | POA: Diagnosis not present

## 2022-04-30 DIAGNOSIS — E119 Type 2 diabetes mellitus without complications: Secondary | ICD-10-CM | POA: Diagnosis not present

## 2022-05-01 ENCOUNTER — Ambulatory Visit (HOSPITAL_COMMUNITY): Payer: PPO | Admitting: Hematology

## 2022-05-01 ENCOUNTER — Inpatient Hospital Stay (HOSPITAL_COMMUNITY): Payer: PPO

## 2022-05-01 ENCOUNTER — Encounter: Payer: Self-pay | Admitting: Orthopedic Surgery

## 2022-05-01 ENCOUNTER — Ambulatory Visit (INDEPENDENT_AMBULATORY_CARE_PROVIDER_SITE_OTHER): Payer: PPO | Admitting: Orthopedic Surgery

## 2022-05-01 VITALS — Ht 68.0 in | Wt 135.0 lb

## 2022-05-01 DIAGNOSIS — S72402A Unspecified fracture of lower end of left femur, initial encounter for closed fracture: Secondary | ICD-10-CM

## 2022-05-01 DIAGNOSIS — S72401A Unspecified fracture of lower end of right femur, initial encounter for closed fracture: Secondary | ICD-10-CM

## 2022-05-01 NOTE — Progress Notes (Signed)
New Patient Visit ? ?Assessment: ?Brittney Carroll is a 76 y.o. female with the following: ?1. Closed fracture of distal end of right femur, unspecified fracture morphology, initial encounter ([place]) ?2. Closed fracture of distal end of left femur, unspecified fracture morphology, initial encounter ([place]) ? ?Plan: ?BRITTANNIE TAWNEY has multiple issues evaluated in clinic today.  She fell out of bed recently, and sustained an injury to the lateral distal right femoral condyle, as well as the medial condyle of the left distal femur.  She is not ambulatory.  She has not walked in over a year.  She has severe equinus contractures bilaterally.  She also has flexion contractures of both knees.  She is in a lot of discomfort.  We placed her in bilateral knee immobilizers, and she is to remain nonweightbearing.  Fractures are minimally displaced, without obvious extension into the joint.  Given the extent of the patient's medical issues, she will follow-up as needed. ? ?Follow-up: ?Return if symptoms worsen or fail to improve. ? ?Subjective: ? ?Chief Complaint  ?Patient presents with  ? Knee Pain  ?  Rt knee DOI 04/22/22  ? ? ?History of Present Illness: ?CHARNIKA Carroll is a 76 y.o. female who presents for evaluation of bilateral knee pain.  She fell out of bed approximately 1 week ago.  She has pain in both legs.  She has a noted history of bilateral hip arthritis.  She is complaining of pain in both legs.  She is currently wearing a knee immobilizer on the right knee.  Nothing for the left knee.  However, she states she has severe pain in the left knee as well.  She has multiple medical issues.  She has not walked in over a year. ? ? ?Review of Systems: ?No fevers or chills ?No numbness or tingling ?No chest pain ?No shortness of breath ?No bowel or bladder dysfunction ?No GI distress ?No headaches ? ? ?Medical History: ? ?Past Medical History:  ?Diagnosis Date  ? Arthritis   ? Breast mass, left   ? Cancer [facility])   ? vaginal 2008  ?  Diabetes mellitus without complication ([place])   ? Hyperlipidemia   ? Hypertension   ? ? ?Past Surgical History:  ?Procedure Laterality Date  ? ABDOMINAL HYSTERECTOMY    ? APPENDECTOMY    ? BIOPSY  12/12/2020  ? Procedure: BIOPSY;  Surgeon: Eloise Harman, DO;  [facility];  Service: Endoscopy;;  ? COLONOSCOPY WITH PROPOFOL N/A 12/12/2020  ? Procedure: COLONOSCOPY WITH PROPOFOL;  Surgeon: Eloise Harman, DO;  [facility];  Service: Endoscopy;  Laterality: N/A;  ? ESOPHAGOGASTRODUODENOSCOPY (EGD) WITH PROPOFOL N/A 12/12/2020  ? Procedure: ESOPHAGOGASTRODUODENOSCOPY (EGD) WITH PROPOFOL;  Surgeon: Eloise Harman, DO;  [facility];  Service: Endoscopy;  Laterality: N/A;  ? MODIFIED MASTECTOMY Left 10/27/2021  ? Procedure: LEFT MODIFIED RADICAL MASTECTOMY;  Surgeon: Jovita Kussmaul, MD;  Location: [facility];  Service: General;  Laterality: Left;  ? TONSILLECTOMY    ? ? ?Family History  ?Problem Relation Age of Onset  ? Breast cancer Sister   ?     Deceased in her 57s  ? ?Social History  ? ?Tobacco Use  ? Smoking status: Former  ?  Packs/day: 1.50  ?  Years: 7.00  ?  Pack years: 10.50  ?  Types: Cigarettes  ? Smokeless tobacco: Never  ? Tobacco comments:  ?  quit 50 yrs ago  ?Vaping Use  ? Vaping  Use: Never used  ?Substance Use Topics  ? Alcohol use: Not Currently  ? Drug use: Never  ? ? ?Allergies  ?Allergen Reactions  ? Atenolol Other (See Comments)  ?  Makes BP drop suddenly and feels lifeless  ? ? ?No outpatient medications have been marked as taking for the 05/01/22 encounter (Office Visit) with Mordecai Rasmussen, MD.  ? ? ?Objective: ?Ht 5\' 8"  (1.727 m)   Wt 135 lb (61.2 kg)   BMI 20.53 kg/m?  ? ?Physical Exam: ? ?General: Seated in a wheelchair.  She is in obvious discomfort.  She is almost inconsolable. ?Gait: Unable to ambulate. ? ?Bilateral equinus contractures.  15 degree flexion contractures of both knees.  She has severe pain with even gentle range of motion of bilateral  lower extremities.  Bruising and swelling of both knees. ? ?IMAGING: ?I personally reviewed images previously obtained from the ED ? ?Minimally displaced fracture of the lateral femoral condyle on the right knee.  Minimally displaced fracture of the medial femoral condyle of the left knee. ? ? ?New Medications:  ?No orders of the defined types were placed in this encounter. ? ? ? ? ?Mordecai Rasmussen, MD ? ?05/01/2022 ?10:15 PM ? ? ?

## 2022-05-08 DIAGNOSIS — F4321 Adjustment disorder with depressed mood: Secondary | ICD-10-CM | POA: Diagnosis not present

## 2022-05-09 DIAGNOSIS — I10 Essential (primary) hypertension: Secondary | ICD-10-CM | POA: Diagnosis not present

## 2022-05-09 DIAGNOSIS — F419 Anxiety disorder, unspecified: Secondary | ICD-10-CM | POA: Diagnosis not present

## 2022-05-14 DIAGNOSIS — Z515 Encounter for palliative care: Secondary | ICD-10-CM | POA: Diagnosis not present

## 2022-05-14 DIAGNOSIS — F411 Generalized anxiety disorder: Secondary | ICD-10-CM | POA: Diagnosis not present

## 2022-05-14 DIAGNOSIS — C50412 Malignant neoplasm of upper-outer quadrant of left female breast: Secondary | ICD-10-CM | POA: Diagnosis not present

## 2022-05-14 DIAGNOSIS — F4321 Adjustment disorder with depressed mood: Secondary | ICD-10-CM | POA: Diagnosis not present

## 2022-05-14 DIAGNOSIS — Z170 Estrogen receptor positive status [ER+]: Secondary | ICD-10-CM | POA: Diagnosis not present

## 2022-05-17 DIAGNOSIS — F32A Depression, unspecified: Secondary | ICD-10-CM | POA: Diagnosis not present

## 2022-05-17 DIAGNOSIS — K219 Gastro-esophageal reflux disease without esophagitis: Secondary | ICD-10-CM | POA: Diagnosis not present

## 2022-05-17 DIAGNOSIS — I2699 Other pulmonary embolism without acute cor pulmonale: Secondary | ICD-10-CM | POA: Diagnosis not present

## 2022-05-17 DIAGNOSIS — S72401D Unspecified fracture of lower end of right femur, subsequent encounter for closed fracture with routine healing: Secondary | ICD-10-CM | POA: Diagnosis not present

## 2022-05-17 DIAGNOSIS — I10 Essential (primary) hypertension: Secondary | ICD-10-CM | POA: Diagnosis not present

## 2022-05-18 DIAGNOSIS — F4321 Adjustment disorder with depressed mood: Secondary | ICD-10-CM | POA: Diagnosis not present

## 2022-05-18 DIAGNOSIS — F411 Generalized anxiety disorder: Secondary | ICD-10-CM | POA: Diagnosis not present

## 2022-05-22 ENCOUNTER — Ambulatory Visit (HOSPITAL_COMMUNITY): Payer: PPO | Admitting: Hematology

## 2022-05-22 ENCOUNTER — Inpatient Hospital Stay (HOSPITAL_COMMUNITY): Payer: PPO

## 2022-05-23 DIAGNOSIS — F411 Generalized anxiety disorder: Secondary | ICD-10-CM | POA: Diagnosis not present

## 2022-05-23 DIAGNOSIS — N399 Disorder of urinary system, unspecified: Secondary | ICD-10-CM | POA: Diagnosis not present

## 2022-05-23 DIAGNOSIS — F4321 Adjustment disorder with depressed mood: Secondary | ICD-10-CM | POA: Diagnosis not present

## 2022-05-23 DIAGNOSIS — N390 Urinary tract infection, site not specified: Secondary | ICD-10-CM | POA: Diagnosis not present

## 2022-05-30 DIAGNOSIS — I10 Essential (primary) hypertension: Secondary | ICD-10-CM | POA: Diagnosis not present

## 2022-05-30 DIAGNOSIS — F419 Anxiety disorder, unspecified: Secondary | ICD-10-CM | POA: Diagnosis not present

## 2022-05-31 DIAGNOSIS — G9341 Metabolic encephalopathy: Secondary | ICD-10-CM | POA: Diagnosis not present

## 2022-05-31 DIAGNOSIS — F411 Generalized anxiety disorder: Secondary | ICD-10-CM | POA: Diagnosis not present

## 2022-05-31 DIAGNOSIS — F4321 Adjustment disorder with depressed mood: Secondary | ICD-10-CM | POA: Diagnosis not present

## 2022-05-31 DIAGNOSIS — D649 Anemia, unspecified: Secondary | ICD-10-CM | POA: Diagnosis not present

## 2022-05-31 DIAGNOSIS — I10 Essential (primary) hypertension: Secondary | ICD-10-CM | POA: Diagnosis not present

## 2022-06-04 DIAGNOSIS — G9341 Metabolic encephalopathy: Secondary | ICD-10-CM | POA: Diagnosis not present

## 2022-06-04 DIAGNOSIS — F4321 Adjustment disorder with depressed mood: Secondary | ICD-10-CM | POA: Diagnosis not present

## 2022-06-11 DIAGNOSIS — F411 Generalized anxiety disorder: Secondary | ICD-10-CM | POA: Diagnosis not present

## 2022-06-12 DIAGNOSIS — F411 Generalized anxiety disorder: Secondary | ICD-10-CM | POA: Diagnosis not present

## 2022-06-12 DIAGNOSIS — F330 Major depressive disorder, recurrent, mild: Secondary | ICD-10-CM | POA: Diagnosis not present

## 2022-06-13 DIAGNOSIS — L600 Ingrowing nail: Secondary | ICD-10-CM | POA: Diagnosis not present

## 2022-06-13 DIAGNOSIS — M79674 Pain in right toe(s): Secondary | ICD-10-CM | POA: Diagnosis not present

## 2022-06-14 DIAGNOSIS — F4321 Adjustment disorder with depressed mood: Secondary | ICD-10-CM | POA: Diagnosis not present

## 2022-06-18 DIAGNOSIS — I10 Essential (primary) hypertension: Secondary | ICD-10-CM | POA: Diagnosis not present

## 2022-06-18 DIAGNOSIS — Z9181 History of falling: Secondary | ICD-10-CM | POA: Diagnosis not present

## 2022-06-20 DIAGNOSIS — K219 Gastro-esophageal reflux disease without esophagitis: Secondary | ICD-10-CM | POA: Diagnosis not present

## 2022-06-20 DIAGNOSIS — F339 Major depressive disorder, recurrent, unspecified: Secondary | ICD-10-CM | POA: Diagnosis not present

## 2022-06-20 DIAGNOSIS — I10 Essential (primary) hypertension: Secondary | ICD-10-CM | POA: Diagnosis not present

## 2022-06-20 DIAGNOSIS — K5900 Constipation, unspecified: Secondary | ICD-10-CM | POA: Diagnosis not present

## 2022-06-21 ENCOUNTER — Ambulatory Visit: Payer: PPO

## 2022-06-21 ENCOUNTER — Other Ambulatory Visit: Payer: Self-pay | Admitting: Orthopedic Surgery

## 2022-06-21 DIAGNOSIS — I10 Essential (primary) hypertension: Secondary | ICD-10-CM | POA: Diagnosis not present

## 2022-06-21 DIAGNOSIS — K5900 Constipation, unspecified: Secondary | ICD-10-CM | POA: Diagnosis not present

## 2022-06-21 DIAGNOSIS — K649 Unspecified hemorrhoids: Secondary | ICD-10-CM | POA: Diagnosis not present

## 2022-06-21 DIAGNOSIS — Z86711 Personal history of pulmonary embolism: Secondary | ICD-10-CM | POA: Diagnosis not present

## 2022-06-21 DIAGNOSIS — R4182 Altered mental status, unspecified: Secondary | ICD-10-CM | POA: Diagnosis not present

## 2022-06-21 DIAGNOSIS — C50912 Malignant neoplasm of unspecified site of left female breast: Secondary | ICD-10-CM | POA: Diagnosis not present

## 2022-06-21 DIAGNOSIS — F339 Major depressive disorder, recurrent, unspecified: Secondary | ICD-10-CM | POA: Diagnosis not present

## 2022-06-21 DIAGNOSIS — S72401A Unspecified fracture of lower end of right femur, initial encounter for closed fracture: Secondary | ICD-10-CM

## 2022-06-21 DIAGNOSIS — S72402A Unspecified fracture of lower end of left femur, initial encounter for closed fracture: Secondary | ICD-10-CM

## 2022-06-21 NOTE — Progress Notes (Signed)
I spoke with patient's daughter Brittney Carroll, and discussed concern for patient's pain tolerance to travel here to [place].  Brittney Carroll says patient is worried to take the knee braces off, that the fractures are not healed.  Per Dr Amedeo Kinsman, her fractures at 2 months out are likely healed/healing.  Patient is not experiencing any increased pain in the knees.  He is ok if the knee braces are removed.  If patient/family wish to have an xray of the knees, that is fine.  Xray ordered and in epic.  Treatment plan will likely not change, but xray confirmation Brittney Carroll help patient feel comfortable enough to make the transition out of the knee braces.  Daughter Brittney Carroll will call us with any concerns.  Appt for tomorrow cancelled, and she will let facility know.    [employer] and [place]  [phone]

## 2022-06-22 ENCOUNTER — Encounter: Payer: PPO | Admitting: Orthopedic Surgery

## 2022-06-26 DIAGNOSIS — R110 Nausea: Secondary | ICD-10-CM | POA: Diagnosis not present

## 2022-06-26 DIAGNOSIS — C50912 Malignant neoplasm of unspecified site of left female breast: Secondary | ICD-10-CM | POA: Diagnosis not present

## 2022-06-29 DIAGNOSIS — C50912 Malignant neoplasm of unspecified site of left female breast: Secondary | ICD-10-CM | POA: Diagnosis not present

## 2022-06-29 DIAGNOSIS — R112 Nausea with vomiting, unspecified: Secondary | ICD-10-CM | POA: Diagnosis not present

## 2022-06-30 DIAGNOSIS — R1111 Vomiting without nausea: Secondary | ICD-10-CM | POA: Diagnosis not present

## 2022-07-02 DIAGNOSIS — K567 Ileus, unspecified: Secondary | ICD-10-CM | POA: Diagnosis not present

## 2022-07-03 DIAGNOSIS — K567 Ileus, unspecified: Secondary | ICD-10-CM | POA: Diagnosis not present

## 2022-07-05 DIAGNOSIS — R109 Unspecified abdominal pain: Secondary | ICD-10-CM | POA: Diagnosis not present

## 2022-07-11 DIAGNOSIS — G8929 Other chronic pain: Secondary | ICD-10-CM | POA: Diagnosis not present

## 2022-07-11 DIAGNOSIS — R4182 Altered mental status, unspecified: Secondary | ICD-10-CM | POA: Diagnosis not present

## 2022-07-12 DIAGNOSIS — R4182 Altered mental status, unspecified: Secondary | ICD-10-CM | POA: Diagnosis not present

## 2022-07-13 DIAGNOSIS — R4182 Altered mental status, unspecified: Secondary | ICD-10-CM | POA: Diagnosis not present

## 2022-07-23 DIAGNOSIS — R4182 Altered mental status, unspecified: Secondary | ICD-10-CM | POA: Diagnosis not present

## 2022-07-25 DIAGNOSIS — C50912 Malignant neoplasm of unspecified site of left female breast: Secondary | ICD-10-CM | POA: Diagnosis not present

## 2022-07-25 DIAGNOSIS — G8929 Other chronic pain: Secondary | ICD-10-CM | POA: Diagnosis not present

## 2022-07-25 DIAGNOSIS — N390 Urinary tract infection, site not specified: Secondary | ICD-10-CM | POA: Diagnosis not present

## 2022-07-25 DIAGNOSIS — Z170 Estrogen receptor positive status [ER+]: Secondary | ICD-10-CM | POA: Diagnosis not present

## 2022-07-25 DIAGNOSIS — C50412 Malignant neoplasm of upper-outer quadrant of left female breast: Secondary | ICD-10-CM | POA: Diagnosis not present

## 2022-07-27 DIAGNOSIS — R2689 Other abnormalities of gait and mobility: Secondary | ICD-10-CM | POA: Diagnosis not present

## 2022-07-27 DIAGNOSIS — M6259 Muscle wasting and atrophy, not elsewhere classified, multiple sites: Secondary | ICD-10-CM | POA: Diagnosis not present

## 2022-08-04 DIAGNOSIS — D509 Iron deficiency anemia, unspecified: Secondary | ICD-10-CM | POA: Diagnosis not present

## 2022-08-16 DIAGNOSIS — D649 Anemia, unspecified: Secondary | ICD-10-CM | POA: Diagnosis not present

## 2022-08-16 DIAGNOSIS — G893 Neoplasm related pain (acute) (chronic): Secondary | ICD-10-CM | POA: Diagnosis not present

## 2022-08-16 DIAGNOSIS — C50912 Malignant neoplasm of unspecified site of left female breast: Secondary | ICD-10-CM | POA: Diagnosis not present

## 2022-08-16 DIAGNOSIS — G8929 Other chronic pain: Secondary | ICD-10-CM | POA: Diagnosis not present

## 2022-08-29 ENCOUNTER — Other Ambulatory Visit: Payer: Self-pay | Admitting: General Surgery

## 2022-08-29 DIAGNOSIS — R410 Disorientation, unspecified: Secondary | ICD-10-CM

## 2022-09-03 ENCOUNTER — Ambulatory Visit: Payer: PPO

## 2022-09-17 DIAGNOSIS — R41841 Cognitive communication deficit: Secondary | ICD-10-CM | POA: Diagnosis not present

## 2022-09-21 DIAGNOSIS — F339 Major depressive disorder, recurrent, unspecified: Secondary | ICD-10-CM | POA: Diagnosis not present

## 2022-10-05 DIAGNOSIS — F4325 Adjustment disorder with mixed disturbance of emotions and conduct: Secondary | ICD-10-CM | POA: Diagnosis not present

## 2022-10-05 DIAGNOSIS — F339 Major depressive disorder, recurrent, unspecified: Secondary | ICD-10-CM | POA: Diagnosis not present

## 2022-10-05 DIAGNOSIS — F331 Major depressive disorder, recurrent, moderate: Secondary | ICD-10-CM | POA: Diagnosis not present

## 2022-10-16 DIAGNOSIS — R441 Visual hallucinations: Secondary | ICD-10-CM | POA: Diagnosis not present

## 2022-10-17 DIAGNOSIS — R102 Pelvic and perineal pain: Secondary | ICD-10-CM | POA: Diagnosis not present

## 2022-10-17 DIAGNOSIS — M25559 Pain in unspecified hip: Secondary | ICD-10-CM | POA: Diagnosis not present

## 2022-10-17 DIAGNOSIS — G8929 Other chronic pain: Secondary | ICD-10-CM | POA: Diagnosis not present

## 2022-10-17 DIAGNOSIS — R441 Visual hallucinations: Secondary | ICD-10-CM | POA: Diagnosis not present

## 2022-10-19 DIAGNOSIS — F0390 Unspecified dementia without behavioral disturbance: Secondary | ICD-10-CM | POA: Diagnosis not present

## 2022-10-19 DIAGNOSIS — G8929 Other chronic pain: Secondary | ICD-10-CM | POA: Diagnosis not present

## 2022-10-19 DIAGNOSIS — M25559 Pain in unspecified hip: Secondary | ICD-10-CM | POA: Diagnosis not present

## 2022-10-19 DIAGNOSIS — R443 Hallucinations, unspecified: Secondary | ICD-10-CM | POA: Diagnosis not present

## 2022-10-19 DIAGNOSIS — R102 Pelvic and perineal pain: Secondary | ICD-10-CM | POA: Diagnosis not present

## 2022-10-30 DIAGNOSIS — R440 Auditory hallucinations: Secondary | ICD-10-CM | POA: Diagnosis not present

## 2022-10-30 DIAGNOSIS — R441 Visual hallucinations: Secondary | ICD-10-CM | POA: Diagnosis not present

## 2022-10-31 DIAGNOSIS — D649 Anemia, unspecified: Secondary | ICD-10-CM | POA: Diagnosis not present

## 2022-10-31 DIAGNOSIS — F03918 Unspecified dementia, unspecified severity, with other behavioral disturbance: Secondary | ICD-10-CM | POA: Diagnosis not present

## 2022-10-31 DIAGNOSIS — L97529 Non-pressure chronic ulcer of other part of left foot with unspecified severity: Secondary | ICD-10-CM | POA: Diagnosis not present

## 2022-11-02 DIAGNOSIS — R441 Visual hallucinations: Secondary | ICD-10-CM | POA: Diagnosis not present

## 2022-11-02 DIAGNOSIS — R451 Restlessness and agitation: Secondary | ICD-10-CM | POA: Diagnosis not present

## 2022-11-02 DIAGNOSIS — F4325 Adjustment disorder with mixed disturbance of emotions and conduct: Secondary | ICD-10-CM | POA: Diagnosis not present

## 2022-11-02 DIAGNOSIS — F331 Major depressive disorder, recurrent, moderate: Secondary | ICD-10-CM | POA: Diagnosis not present

## 2022-11-07 DIAGNOSIS — F03918 Unspecified dementia, unspecified severity, with other behavioral disturbance: Secondary | ICD-10-CM | POA: Diagnosis not present

## 2022-11-07 DIAGNOSIS — D649 Anemia, unspecified: Secondary | ICD-10-CM | POA: Diagnosis not present

## 2022-11-07 DIAGNOSIS — L97529 Non-pressure chronic ulcer of other part of left foot with unspecified severity: Secondary | ICD-10-CM | POA: Diagnosis not present

## 2023-02-14 ENCOUNTER — Encounter: Payer: Self-pay | Admitting: Radiology

## 2023-06-01 ENCOUNTER — Other Ambulatory Visit: Payer: Self-pay

## 2023-06-01 ENCOUNTER — Emergency Department
Admission: EM | Admit: 2023-06-01 | Discharge: 2023-06-02 | Disposition: A | Discharge status: Skilled Nursing Facility | Payer: Medicare Other | Attending: Student in an Organized Health Care Education/Training Program | Admitting: Student in an Organized Health Care Education/Training Program

## 2023-06-01 ENCOUNTER — Emergency Department: Payer: Medicare Other

## 2023-06-01 DIAGNOSIS — R0789 Other chest pain: Secondary | ICD-10-CM | POA: Insufficient documentation

## 2023-06-01 DIAGNOSIS — F419 Anxiety disorder, unspecified: Secondary | ICD-10-CM | POA: Insufficient documentation

## 2023-06-01 DIAGNOSIS — R110 Nausea: Secondary | ICD-10-CM

## 2023-06-01 DIAGNOSIS — R079 Chest pain, unspecified: Secondary | ICD-10-CM | POA: Diagnosis present

## 2023-06-01 LAB — BASIC METABOLIC PANEL
Anion gap: 14 (ref 5–15)
BUN: 26 mg/dL — ABNORMAL HIGH (ref 8–23)
CO2: 22 mmol/L (ref 22–32)
Calcium: 9.1 mg/dL (ref 8.9–10.3)
Chloride: 96 mmol/L — ABNORMAL LOW (ref 98–111)
Creatinine, Ser: 0.83 mg/dL (ref 0.44–1.00)
GFR, Estimated: >60 mL/min (ref >60)
Glucose, Bld: 227 mg/dL — ABNORMAL HIGH (ref 70–99)
Potassium: 3.3 mmol/L — ABNORMAL LOW (ref 3.5–5.1)
Sodium: 132 mmol/L — ABNORMAL LOW (ref 135–145)

## 2023-06-01 LAB — CBC
HCT: 44.7 % (ref 36.0–46.0)
Hemoglobin: 14.5 g/dL (ref 12.0–15.0)
MCH: 29 pg (ref 26.0–34.0)
MCHC: 32.4 g/dL (ref 30.0–36.0)
MCV: 89.4 fL (ref 80.0–100.0)
Platelets: 387 10*3/uL (ref 150–400)
RBC: 5 MIL/uL (ref 3.87–5.11)
RDW: 12.4 % (ref 11.5–15.5)
WBC: 20.6 10*3/uL — ABNORMAL HIGH (ref 4.0–10.5)
nRBC: 0 % (ref 0.0–0.2)

## 2023-06-01 LAB — HEPATIC FUNCTION PANEL
ALT: 33 U/L (ref 0–44)
AST: 51 U/L — ABNORMAL HIGH (ref 15–41)
Albumin: 3.9 g/dL (ref 3.5–5.0)
Alkaline Phosphatase: 73 U/L (ref 38–126)
Bilirubin, Direct: 0.1 mg/dL (ref 0.0–0.2)
Indirect Bilirubin: 0.6 mg/dL (ref 0.3–0.9)
Total Bilirubin: 0.7 mg/dL (ref 0.3–1.2)
Total Protein: 7.1 g/dL (ref 6.5–8.1)

## 2023-06-01 LAB — TROPONIN I (HIGH SENSITIVITY)
Troponin I (High Sensitivity): 10 ng/L (ref <18)
Troponin I (High Sensitivity): 10 ng/L (ref <18)

## 2023-06-01 LAB — LIPASE, BLOOD: Lipase: 22 U/L (ref 11–51)

## 2023-06-01 MED ORDER — METOCLOPRAMIDE HCL 10 MG PO TABS: 10.0000 mg | ORAL_TABLET | Freq: Four times a day (QID) | ORAL | 0 refills | Status: AC | PRN

## 2023-06-01 MED ORDER — CLONIDINE HCL 0.1 MG PO TABS
0.2000 mg | ORAL_TABLET | Freq: Once | ORAL | Status: AC
  Administered 2023-06-01: 0.2 mg via ORAL
  Filled 2023-06-01: qty 2

## 2023-06-01 MED ORDER — IOHEXOL 350 MG/ML SOLN
100.0000 mL | Freq: Once | INTRAVENOUS | Status: AC | PRN
  Administered 2023-06-01: 100 mL via INTRAVENOUS

## 2023-06-01 MED ORDER — MORPHINE SULFATE (PF) 4 MG/ML IV SOLN
4.0000 mg | INTRAVENOUS | Status: DC | PRN
  Administered 2023-06-01: 4 mg via INTRAVENOUS

## 2023-06-01 MED ORDER — ONDANSETRON HCL 4 MG/2ML IJ SOLN
4.0000 mg | Freq: Once | INTRAMUSCULAR | Status: AC
  Administered 2023-06-01: 4 mg via INTRAVENOUS
  Filled 2023-06-01: qty 2

## 2023-06-01 MED ORDER — DIAZEPAM 5 MG PO TABS
5.0000 mg | ORAL_TABLET | Freq: Once | ORAL | Status: AC
  Administered 2023-06-01: 5 mg via ORAL
  Filled 2023-06-01: qty 1

## 2023-06-01 MED ORDER — DIAZEPAM 2 MG PO TABS: 2.0000 mg | ORAL_TABLET | Freq: Three times a day (TID) | ORAL | 0 refills | Status: AC | PRN

## 2023-06-01 MED ORDER — PROMETHAZINE HCL 12.5 MG PO TABS: 12.5000 mg | ORAL_TABLET | Freq: Four times a day (QID) | ORAL | 0 refills | Status: DC | PRN

## 2023-06-01 NOTE — ED Triage Notes (Signed)
EMS was called out to [facility] for chest pain for 2 days, facility gave 1 SL NTG and patient is now denying chest pain but complaining of abdominal pain and nausea.

## 2023-06-01 NOTE — ED Provider Notes (Signed)
[facility] Provider Note    None    (approximate)   History   Chest Pain   HPI  Brittney Carroll is a 77 y.o. female Struve breast cancer, PE presents from nursing home [facility] due to report of chest pain per EMS with patient states that she been having abdominal pain and epigastric pain multiple episodes of vomiting.  She denies any chest pain or shortness of breath.     Physical Exam   Triage Vital Signs: ED Triage Vitals  Enc Vitals Group     BP 06/01/23 1844 (!) 184/76     Pulse Rate 06/01/23 1840 81     Resp 06/01/23 1840 20     Temp 06/01/23 1840 98.1 F (36.7 C)     Temp Source 06/01/23 1840 Oral     SpO2 06/01/23 1840 100 %     Weight --      Height --      Head Circumference --      Peak Flow --      Pain Score 06/01/23 1838 8     Pain Loc --      Pain Edu? --      Excl. in GC? --     Most recent vital signs: Vitals:   06/01/23 2240 06/01/23 2300  BP: (!) 172/104 (!) 190/98  Pulse: 100 99  Resp:    Temp:    SpO2:       Constitutional: Alert very anxious appearing Eyes: Conjunctivae are normal.  Head: Atraumatic. Nose: No congestion/rhinnorhea. Mouth/Throat: Mucous membranes are moist.   Neck: Painless ROM.  Cardiovascular:   Good peripheral circulation. Respiratory: Normal respiratory effort.  No retractions.  Gastrointestinal: Soft and nontender. To deep palpation Musculoskeletal:  no deformity Neurologic:  MAE spontaneously. No gross focal neurologic deficits are appreciated.  Skin:  Skin is warm, dry and intact. No rash noted. Psychiatric: Mood and affect are normal. Speech and behavior are normal.    ED Results / Procedures / Treatments   Labs (all labs ordered are listed, but only abnormal results are displayed) Labs Reviewed  BASIC METABOLIC PANEL - Abnormal; Notable for the following components:      Result Value   Sodium 132 (*)    Potassium 3.3 (*)    Chloride 96 (*)    Glucose, Bld 227 (*)     BUN 26 (*)    All other components within normal limits  CBC - Abnormal; Notable for the following components:   WBC 20.6 (*)    All other components within normal limits  HEPATIC FUNCTION PANEL - Abnormal; Notable for the following components:   AST 51 (*)    All other components within normal limits  LIPASE, BLOOD  URINALYSIS, ROUTINE W REFLEX MICROSCOPIC  TROPONIN I (HIGH SENSITIVITY)  TROPONIN I (HIGH SENSITIVITY)     EKG  ED ECG REPORT I, Willy Eddy, the attending physician, personally viewed and interpreted this ECG.   Date: 06/01/2023  EKG Time: 18:39  Rate: 90  Rhythm: sinus  Axis: left  Intervals: normal  ST&T Change: no stemi, no depressions    RADIOLOGY Please see ED Course for my review and interpretation.  I personally reviewed all radiographic images ordered to evaluate for the above acute complaints and reviewed radiology reports and findings.  These findings were personally discussed with the patient.  Please see medical record for radiology report.    PROCEDURES:  Critical Care performed: No  Procedures  MEDICATIONS ORDERED IN ED: Medications  morphine (PF) 4 MG/ML injection 4 mg (4 mg Intravenous Given 06/01/23 2159)  ondansetron (ZOFRAN) injection 4 mg (4 mg Intravenous Given 06/01/23 2159)  cloNIDine (CATAPRES) tablet 0.2 mg (0.2 mg Oral Given 06/01/23 2220)  iohexol (OMNIPAQUE) 350 MG/ML injection 100 mL (100 mLs Intravenous Contrast Given 06/01/23 2225)  diazepam (VALIUM) tablet 5 mg (5 mg Oral Given 06/01/23 2300)     IMPRESSION / MDM / ASSESSMENT AND PLAN / ED COURSE  I reviewed the triage vital signs and the nursing notes.                              Differential diagnosis includes, but is not limited to, obstruction, colitis, diverticulitis, perforation, biliary pathology, UTI, pyelonephritis  Patient presenting to the ER for evaluation of symptoms as described above.  Based on symptoms, risk factors and considered above  differential, this presenting complaint could reflect a potentially life-threatening illness therefore the patient will be placed on continuous pulse oximetry and telemetry for monitoring.  Laboratory evaluation will be sent to evaluate for the above complaints.  CT imaging will be ordered for the but differential will give IV pain medication as well as IV zofran for nausea and vomiting.  Chest x-ray my review and interpretation without evidence of consolidation.    Clinical Course as of 06/01/23 2347  Sat Jun 01, 2023  2238 CT imaging my review and interpretation without evidence of dissection perforation or SBO.  Will await formal radiology report. [PR]  2255 Patient admitting to significant increase in anxiety.  Daughter at bedside now stating that this is been an ongoing issue for several months with the patient not wanting to pursue any additional care.  Has several lesions on her legs that she does not want diagnosis or treatment for.  Was previously on hospice as well as palliative care.  They are reportedly trying to wean her benzos at the facility as well as recently starting her on BuSpar and she is concerned that this may be worsening her anxiety. [PR]  2338 Had an extensive and lengthy conversation with the patient and daughter at bedside.  Their goals of care are primarily comfort and would not want to be admitted for treatment of elevated blood pressure right now are her main complaint right now is nausea which has improved after medications which we have provided here in the ER.  She is denying any symptoms of bladder infection.  No fever.  Will be his sent prescription for antiemetic as well as Valium as this seemed to give quite a bit of relief for her.  She does appear appropriate for discharge back to facility given her goals of care. [PR]    Clinical Course User Index [PR] Willy Eddy, MD     FINAL CLINICAL IMPRESSION(S) / ED DIAGNOSES   Final diagnoses:  Atypical chest  pain  Anxiety  Nausea     Rx / DC Orders   ED Discharge Orders          Ordered    diazepam (VALIUM) 2 MG tablet  Every 8 hours PRN        06/01/23 2338    promethazine (PHENERGAN) 12.5 MG tablet  Every 6 hours PRN        06/01/23 2338    metoCLOPramide (REGLAN) 10 MG tablet  Every 6 hours PRN        06/01/23 2342  Note:  This document was prepared using Dragon voice recognition software and may include unintentional dictation errors.    Willy Eddy, MD 06/01/23 [id number]

## 2023-06-01 NOTE — ED Notes (Signed)
MD made aware of BP, RN will continue to closely monitor BP

## 2023-07-25 IMAGING — DX DG KNEE 1-2V PORT*R*
2 series · 2 of 2 positions shown · non-contrast
Comparison: Right femur series 09/10/2021.

CLINICAL DATA: 75-year-old female with pain after fall from bed.

EXAM:
PORTABLE RIGHT KNEE - 1-2 VIEW

[knee ap]
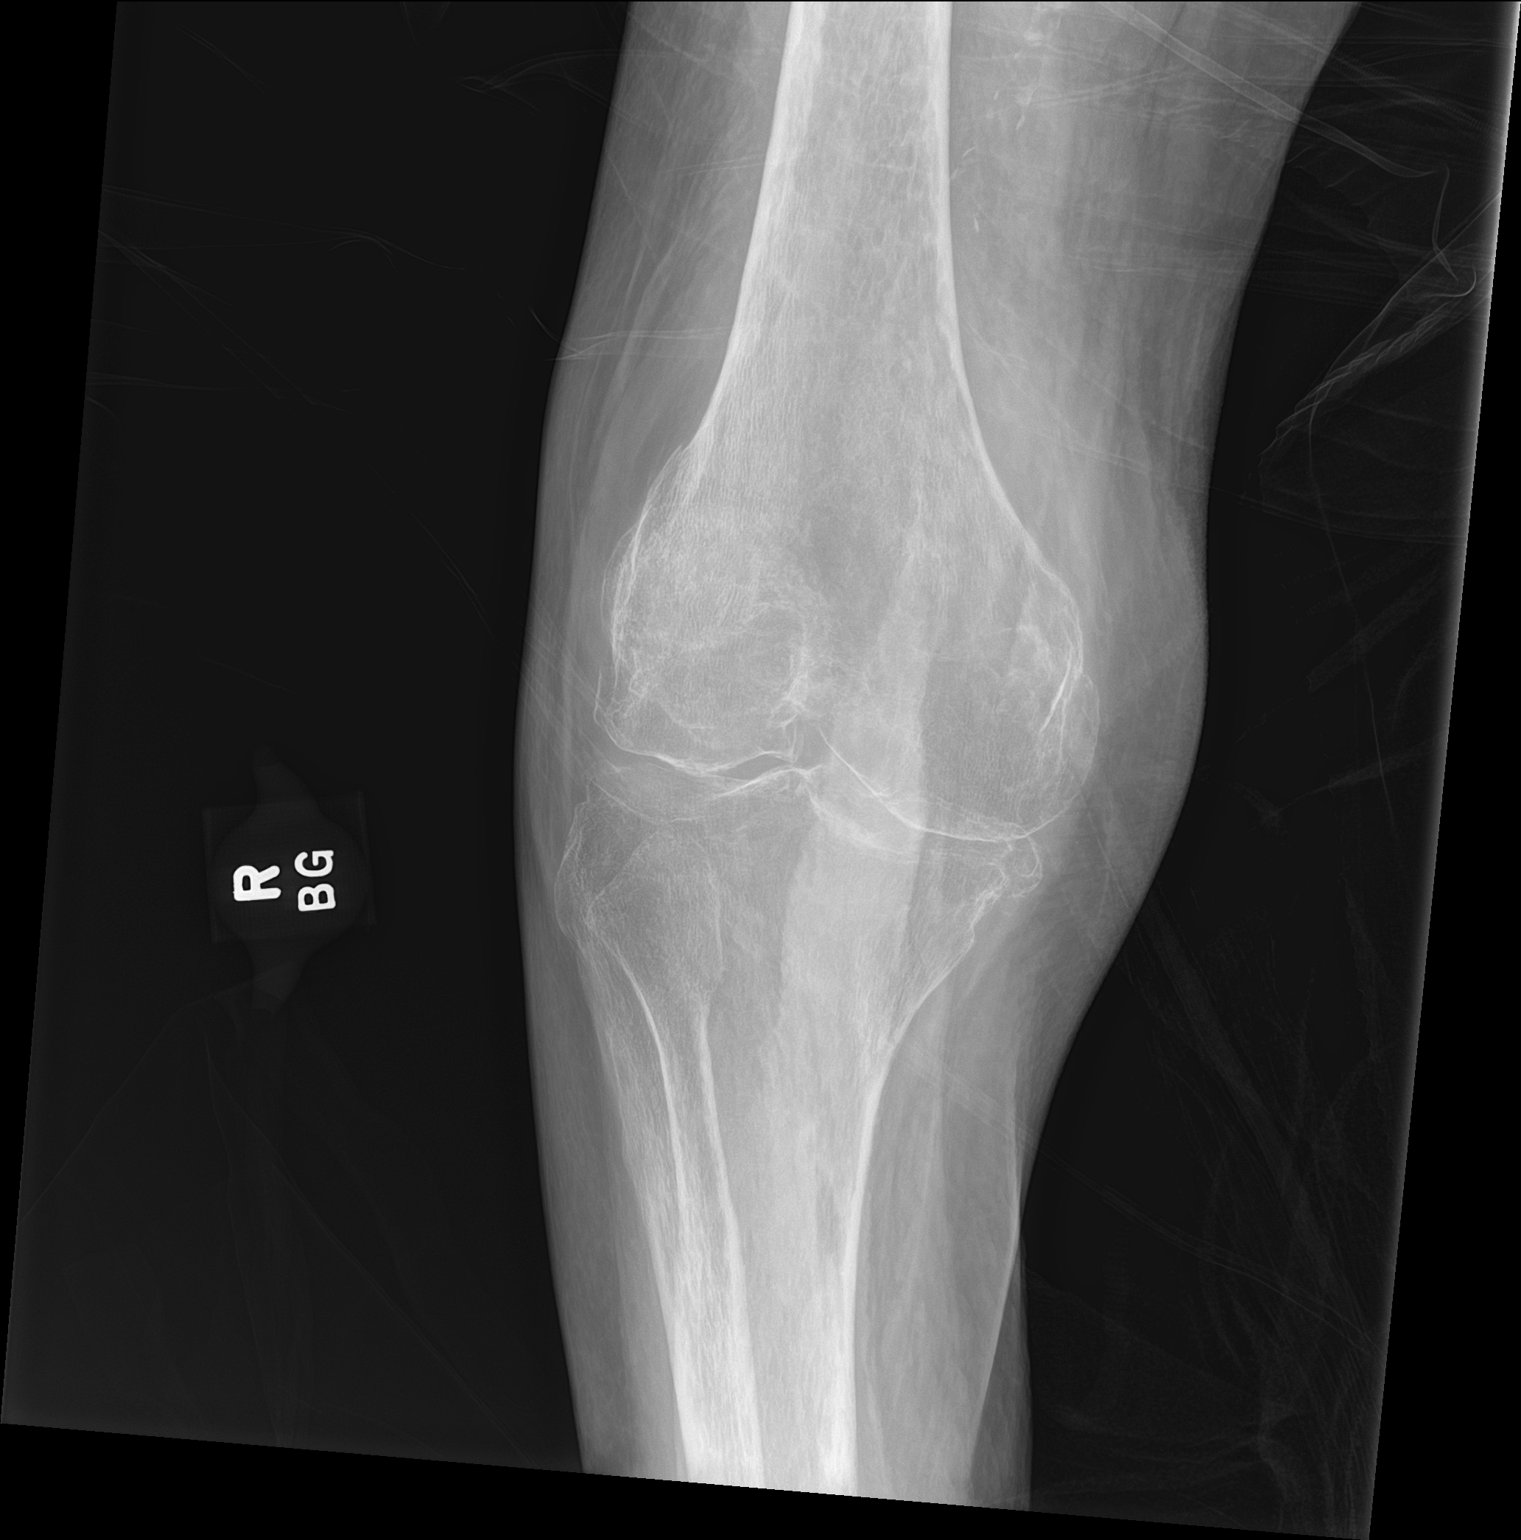

[knee lat]
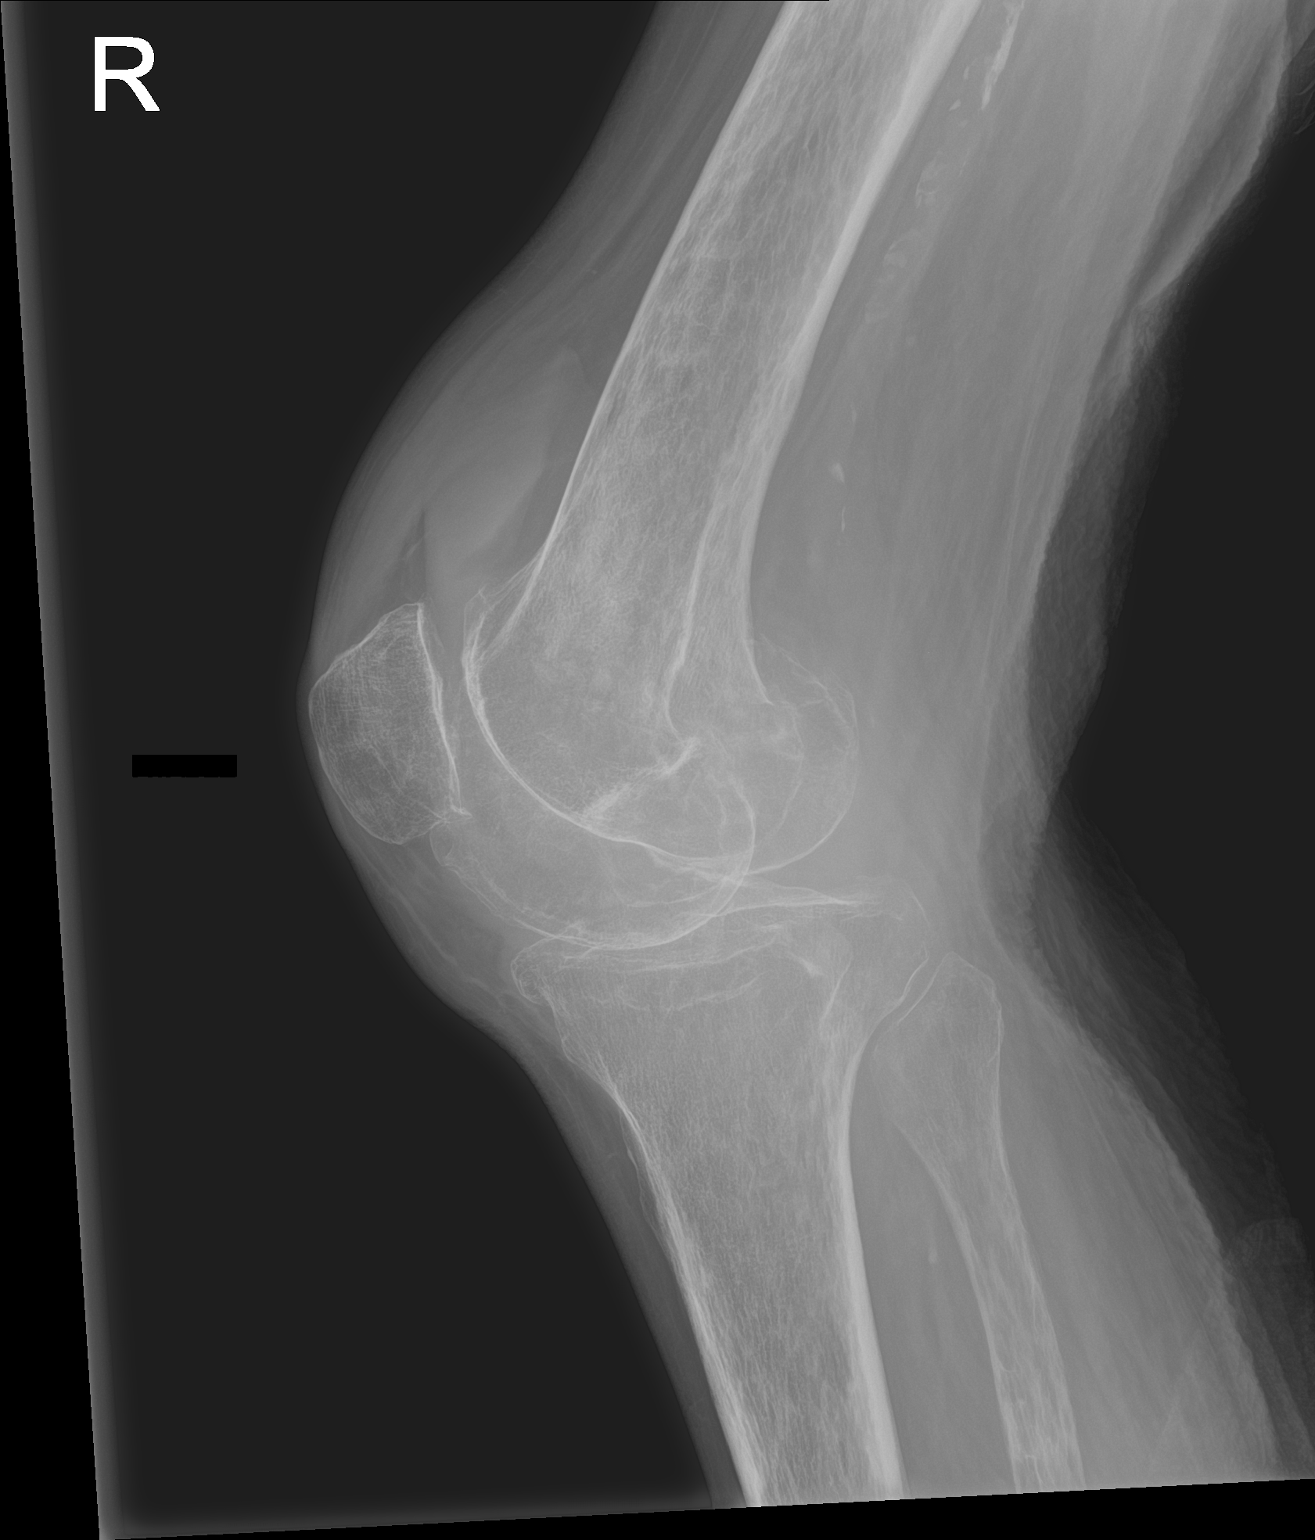

[2 of 2 positions shown; findings below may reference images not displayed]

FINDINGS: Osteopenia. Moderate size hyperdense right knee joint effusion on
the cross-table lateral view. Possible lipohemarthrosis. Patella
appears to remain intact. Tibial plateau also appears grossly intact
along with the proximal fibula. Questionable nondisplaced distal
right femur metadiaphysis fracture laterally (arrow). Regional soft
tissue swelling. Calcified peripheral vascular disease.
IMPRESSION: 1. Knee joint effusion suspicious for lipohemarthrosis.
2. Osteopenia with suspected nondisplaced fracture, possibly at the
lateral femoral metadiaphysis.

## 2023-07-25 IMAGING — DX DG KNEE 1-2V PORT*L*
2 series · 2 of 2 positions shown · non-contrast
Comparison: Left femur series 09/10/2021.

CLINICAL DATA: 75-year-old female with pain after fall from bed.

EXAM:
PORTABLE LEFT KNEE - 1-2 VIEW

[knee ap]
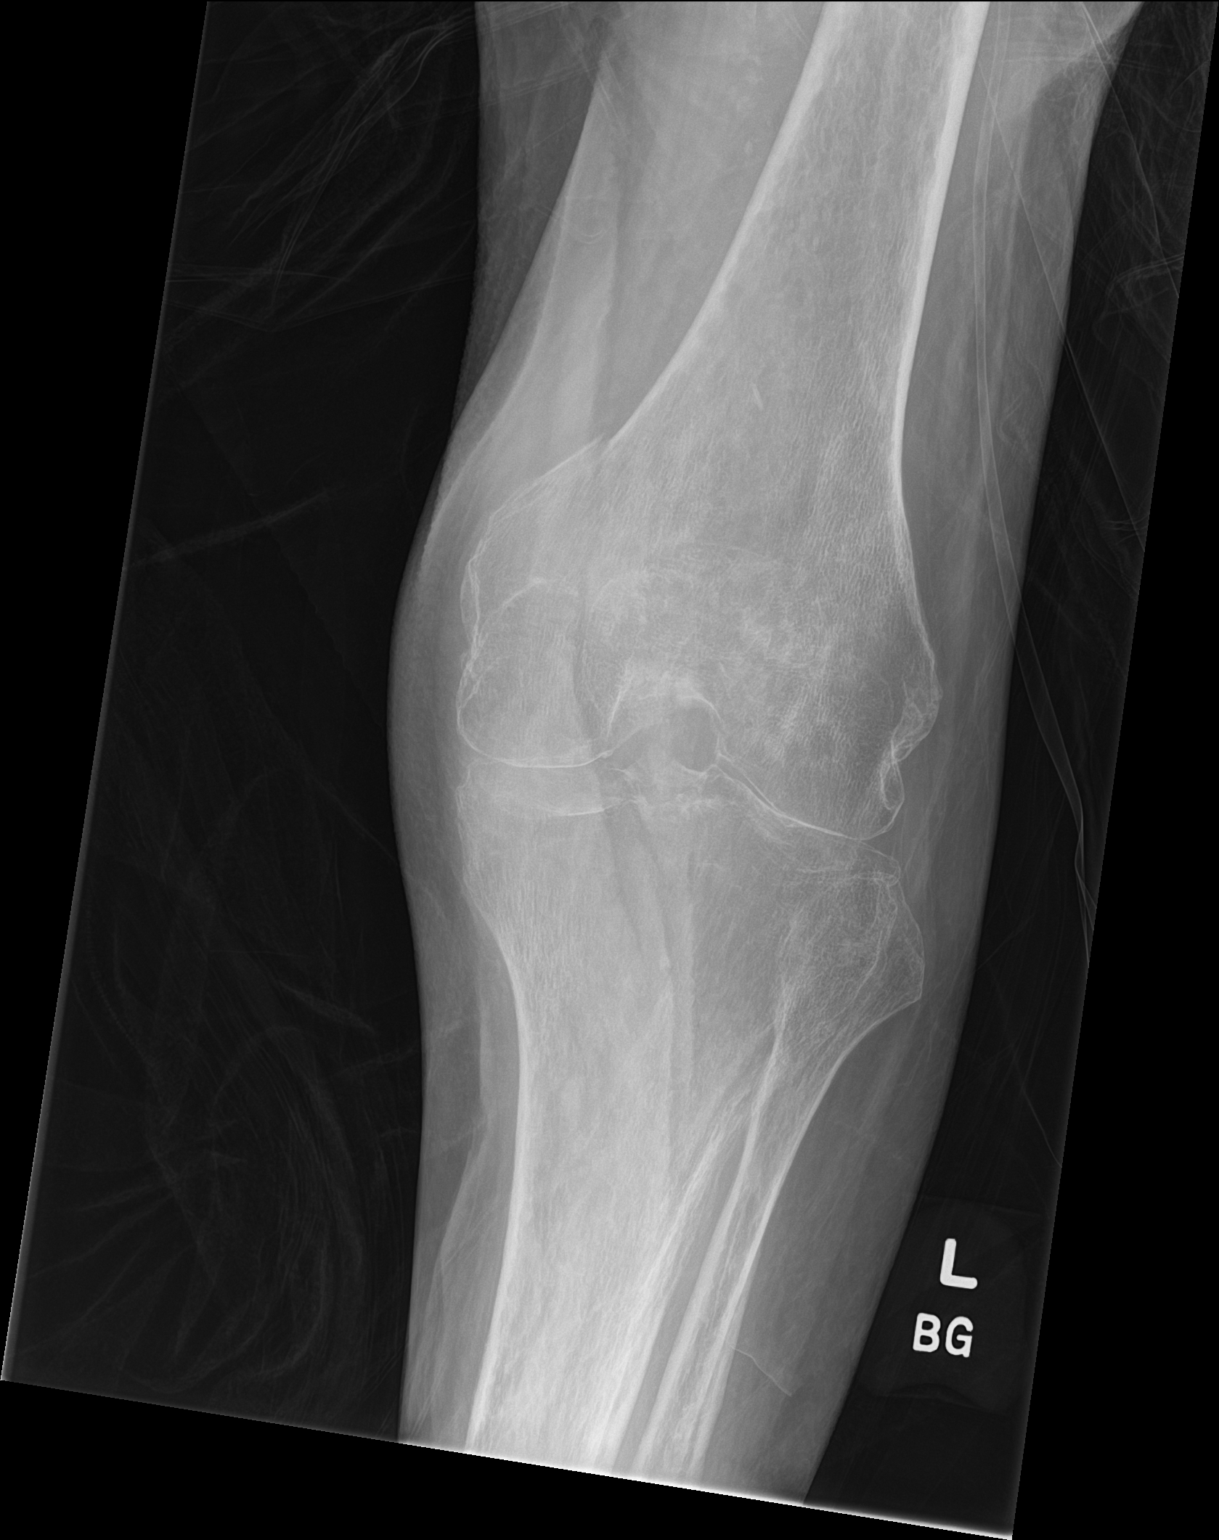

[knee lat]
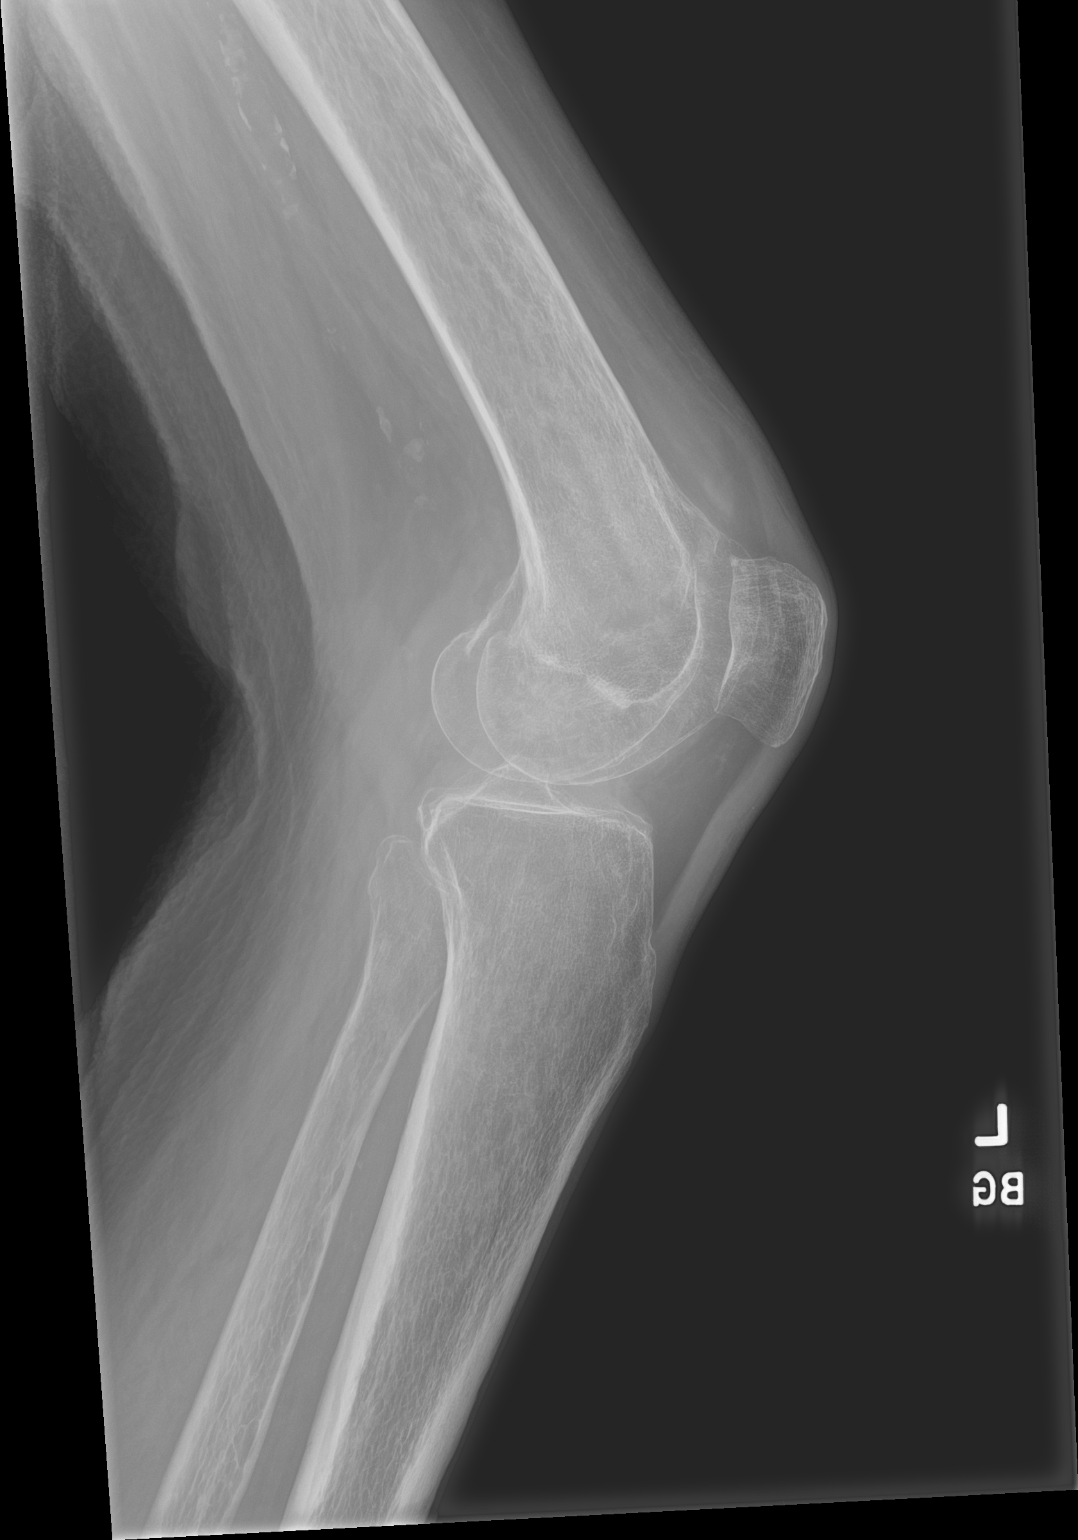

[2 of 2 positions shown; findings below may reference images not displayed]

FINDINGS: Osteopenia. Stable joint spaces and alignment from last year. No
definite joint effusion. Patella appears intact. No acute osseous
abnormality identified. Calcified peripheral vascular disease.
IMPRESSION: Osteopenia. No acute fracture or dislocation identified about the
left knee.

## 2023-07-25 IMAGING — DX DG PORTABLE PELVIS
1 series · 1 of 1 positions shown · non-contrast
Comparison: Pelvis radiograph 09/10/2021. CT Abdomen and Pelvis
09/09/2021.

CLINICAL DATA: 75-year-old female with pain after fall from bed.

EXAM:
PORTABLE PELVIS 1-2 VIEWS

[pelvis ap]
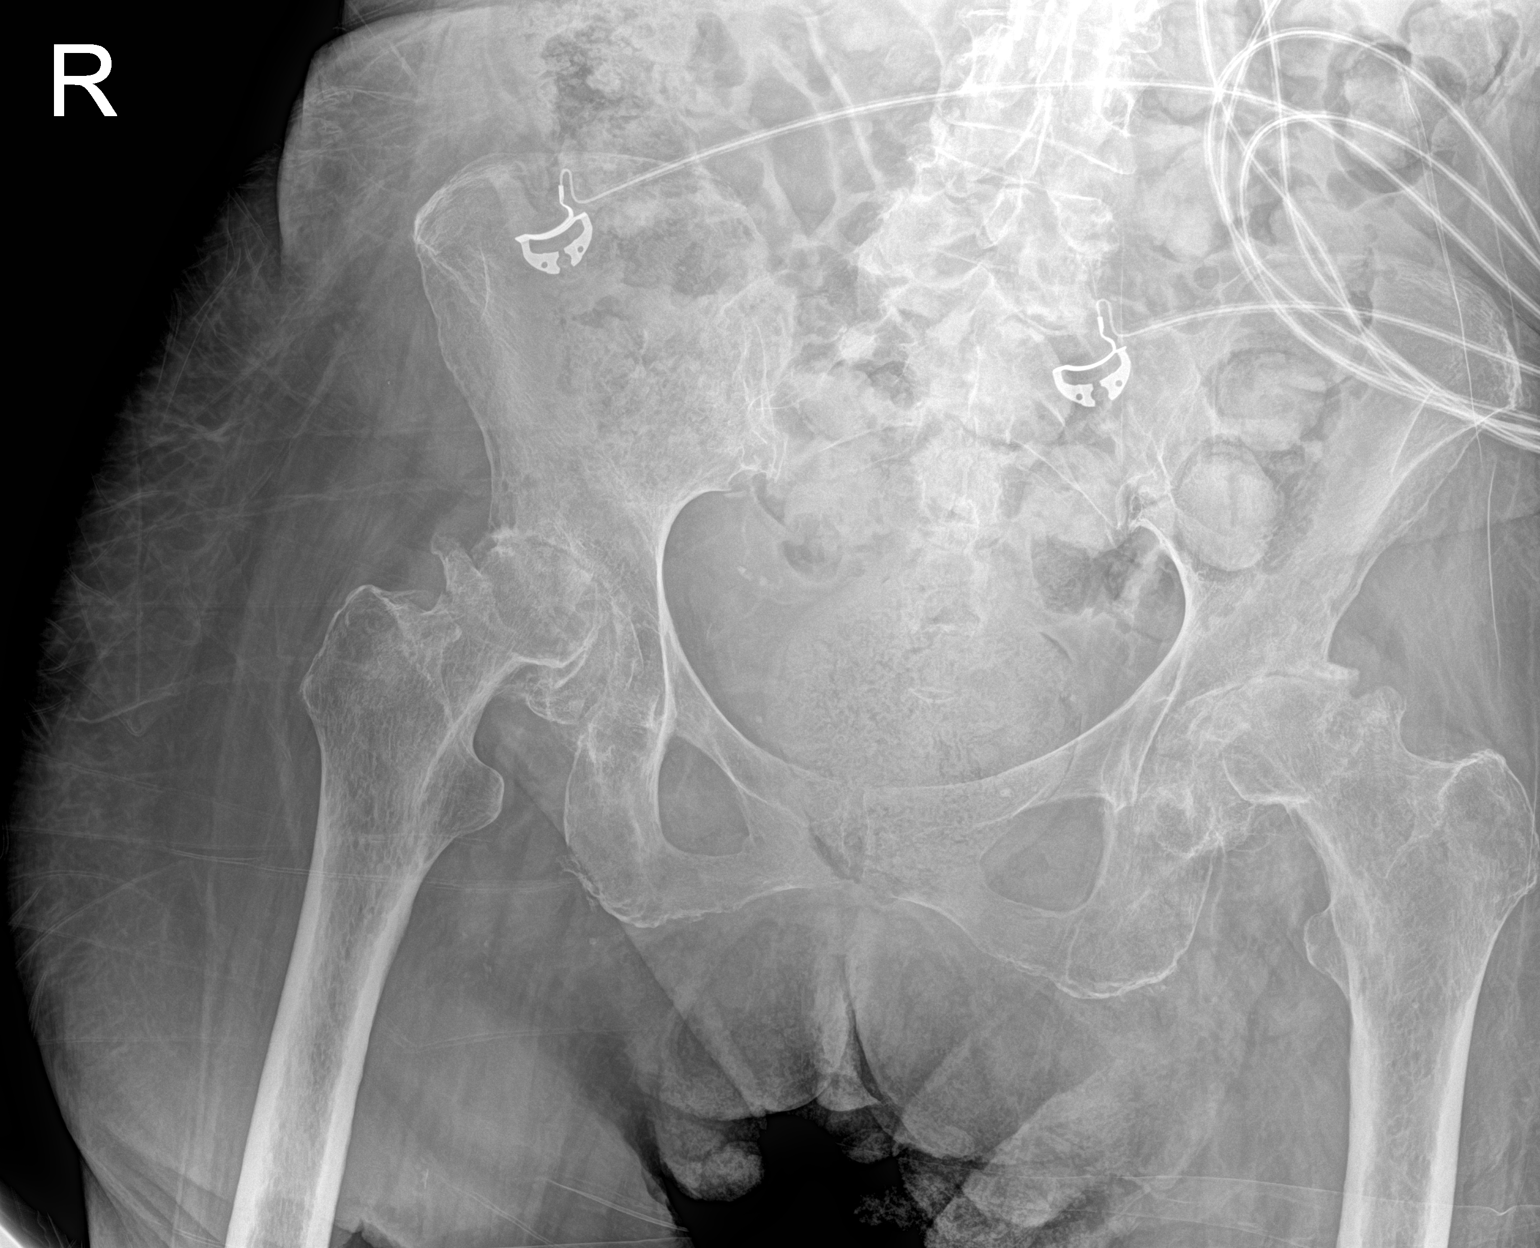

[1 of 1 positions shown; findings below may reference images not displayed]

FINDINGS: AP pelvis at 3650 hours. Severe chronic deformity of the bilateral
hips and femoral heads appears stable from the CT last year. No
acute pelvis fracture identified. Similar stool ball in the rectum.
Nonobstructed visible other bowel gas pattern.
IMPRESSION: 1. No acute fracture or dislocation identified about the pelvis.
2. Severe chronic bilateral hip deformity.

## 2023-12-18 DEATH — deceased
